# Patient Record
Sex: Male | Born: 1975 | Race: White | Hispanic: Yes | Marital: Married | Smoking: Former smoker
Health system: Southern US, Community
[De-identification: ages and names within clinical notes are randomized; demographics above are authoritative.]

## PROBLEM LIST (undated history)

## (undated) DIAGNOSIS — M869 Osteomyelitis, unspecified: Secondary | ICD-10-CM

## (undated) DIAGNOSIS — S2239XA Fracture of one rib, unspecified side, initial encounter for closed fracture: Secondary | ICD-10-CM

## (undated) DIAGNOSIS — S24109A Unspecified injury at unspecified level of thoracic spinal cord, initial encounter: Secondary | ICD-10-CM

## (undated) DIAGNOSIS — F32A Depression, unspecified: Secondary | ICD-10-CM

## (undated) DIAGNOSIS — I10 Essential (primary) hypertension: Secondary | ICD-10-CM

## (undated) DIAGNOSIS — E559 Vitamin D deficiency, unspecified: Secondary | ICD-10-CM

## (undated) DIAGNOSIS — F329 Major depressive disorder, single episode, unspecified: Secondary | ICD-10-CM

## (undated) DIAGNOSIS — N39498 Other specified urinary incontinence: Secondary | ICD-10-CM

## (undated) DIAGNOSIS — L89324 Pressure ulcer of left buttock, stage 4: Secondary | ICD-10-CM

## (undated) DIAGNOSIS — N3289 Other specified disorders of bladder: Secondary | ICD-10-CM

## (undated) DIAGNOSIS — M549 Dorsalgia, unspecified: Secondary | ICD-10-CM

## (undated) DIAGNOSIS — M62838 Other muscle spasm: Secondary | ICD-10-CM

## (undated) DIAGNOSIS — M726 Necrotizing fasciitis: Secondary | ICD-10-CM

## (undated) DIAGNOSIS — G8220 Paraplegia, unspecified: Secondary | ICD-10-CM

## (undated) HISTORY — DX: Vitamin D deficiency, unspecified: E55.9

## (undated) HISTORY — DX: Dorsalgia, unspecified: M54.9

## (undated) HISTORY — DX: Paraplegia, unspecified: G82.20

## (undated) HISTORY — DX: Other muscle spasm: M62.838

## (undated) HISTORY — DX: Osteomyelitis, unspecified: M86.9

## (undated) HISTORY — DX: Pressure ulcer of left buttock, stage 4: L89.324

## (undated) HISTORY — DX: Fracture of one rib, unspecified side, initial encounter for closed fracture: S22.39XA

## (undated) HISTORY — PX: POSTERIOR FIXATION SPINE: SUR1043

## (undated) HISTORY — DX: Major depressive disorder, single episode, unspecified: F32.9

## (undated) HISTORY — DX: Other specified disorders of bladder: N32.89

## (undated) HISTORY — DX: Essential (primary) hypertension: I10

## (undated) HISTORY — DX: Necrotizing fasciitis: M72.6

## (undated) HISTORY — DX: Other specified urinary incontinence: N39.498

## (undated) HISTORY — DX: Depression, unspecified: F32.A

---

## 2000-09-24 DIAGNOSIS — S2239XA Fracture of one rib, unspecified side, initial encounter for closed fracture: Secondary | ICD-10-CM

## 2000-09-24 DIAGNOSIS — S24109A Unspecified injury at unspecified level of thoracic spinal cord, initial encounter: Secondary | ICD-10-CM

## 2000-09-24 HISTORY — DX: Fracture of one rib, unspecified side, initial encounter for closed fracture: S22.39XA

## 2000-09-24 HISTORY — DX: Unspecified injury at unspecified level of thoracic spinal cord, initial encounter: S24.109A

## 2001-09-20 DIAGNOSIS — G8220 Paraplegia, unspecified: Secondary | ICD-10-CM

## 2001-09-20 HISTORY — DX: Paraplegia, unspecified: G82.20

## 2002-06-12 ENCOUNTER — Encounter: Payer: Self-pay | Admitting: Emergency Medicine

## 2002-06-12 ENCOUNTER — Inpatient Hospital Stay (HOSPITAL_COMMUNITY): Admission: EM | Admit: 2002-06-12 | Discharge: 2002-06-15 | Payer: Self-pay | Admitting: Emergency Medicine

## 2002-06-15 ENCOUNTER — Encounter (INDEPENDENT_AMBULATORY_CARE_PROVIDER_SITE_OTHER): Payer: Self-pay | Admitting: Cardiology

## 2002-06-25 ENCOUNTER — Encounter: Admission: RE | Admit: 2002-06-25 | Discharge: 2002-06-25 | Payer: Self-pay | Admitting: Internal Medicine

## 2002-07-28 ENCOUNTER — Encounter: Admission: RE | Admit: 2002-07-28 | Discharge: 2002-09-21 | Payer: Self-pay | Admitting: Infectious Diseases

## 2002-09-29 ENCOUNTER — Ambulatory Visit (HOSPITAL_COMMUNITY): Admission: RE | Admit: 2002-09-29 | Discharge: 2002-09-29 | Payer: Self-pay | Admitting: Internal Medicine

## 2002-09-29 ENCOUNTER — Encounter: Admission: RE | Admit: 2002-09-29 | Discharge: 2002-09-29 | Payer: Self-pay | Admitting: Internal Medicine

## 2002-12-09 ENCOUNTER — Encounter: Admission: RE | Admit: 2002-12-09 | Discharge: 2002-12-09 | Payer: Self-pay | Admitting: Internal Medicine

## 2003-02-25 ENCOUNTER — Encounter: Admission: RE | Admit: 2003-02-25 | Discharge: 2003-02-25 | Payer: Self-pay | Admitting: Internal Medicine

## 2003-03-01 ENCOUNTER — Encounter: Payer: Self-pay | Admitting: Internal Medicine

## 2003-03-01 ENCOUNTER — Ambulatory Visit (HOSPITAL_COMMUNITY): Admission: RE | Admit: 2003-03-01 | Discharge: 2003-03-01 | Payer: Self-pay | Admitting: Internal Medicine

## 2003-04-01 ENCOUNTER — Encounter: Admission: RE | Admit: 2003-04-01 | Discharge: 2003-04-01 | Payer: Self-pay | Admitting: Internal Medicine

## 2003-04-12 ENCOUNTER — Encounter: Payer: Self-pay | Admitting: Internal Medicine

## 2003-04-12 ENCOUNTER — Ambulatory Visit (HOSPITAL_COMMUNITY): Admission: RE | Admit: 2003-04-12 | Discharge: 2003-04-12 | Payer: Self-pay | Admitting: Internal Medicine

## 2003-04-28 ENCOUNTER — Encounter: Admission: RE | Admit: 2003-04-28 | Discharge: 2003-04-28 | Payer: Self-pay | Admitting: Infectious Diseases

## 2003-06-04 ENCOUNTER — Encounter: Payer: Self-pay | Admitting: Surgery

## 2003-06-04 ENCOUNTER — Encounter: Admission: RE | Admit: 2003-06-04 | Discharge: 2003-06-04 | Payer: Self-pay | Admitting: Surgery

## 2003-09-10 ENCOUNTER — Encounter: Admission: RE | Admit: 2003-09-10 | Discharge: 2003-09-10 | Payer: Self-pay | Admitting: Internal Medicine

## 2003-09-25 DIAGNOSIS — M62838 Other muscle spasm: Secondary | ICD-10-CM

## 2003-09-25 HISTORY — DX: Other muscle spasm: M62.838

## 2004-01-07 ENCOUNTER — Encounter: Admission: RE | Admit: 2004-01-07 | Discharge: 2004-01-07 | Payer: Self-pay | Admitting: Internal Medicine

## 2004-01-24 ENCOUNTER — Encounter: Admission: RE | Admit: 2004-01-24 | Discharge: 2004-04-23 | Payer: Self-pay | Admitting: Internal Medicine

## 2004-02-04 ENCOUNTER — Encounter: Admission: RE | Admit: 2004-02-04 | Discharge: 2004-02-04 | Payer: Self-pay | Admitting: Internal Medicine

## 2004-02-15 ENCOUNTER — Encounter: Admission: RE | Admit: 2004-02-15 | Discharge: 2004-02-15 | Payer: Self-pay | Admitting: Internal Medicine

## 2004-04-26 ENCOUNTER — Encounter: Admission: RE | Admit: 2004-04-26 | Discharge: 2004-04-26 | Payer: Self-pay | Admitting: Internal Medicine

## 2004-05-24 ENCOUNTER — Encounter: Admission: RE | Admit: 2004-05-24 | Discharge: 2004-05-24 | Payer: Self-pay | Admitting: Internal Medicine

## 2004-07-28 ENCOUNTER — Ambulatory Visit: Payer: Self-pay | Admitting: Internal Medicine

## 2004-09-24 DIAGNOSIS — N3289 Other specified disorders of bladder: Secondary | ICD-10-CM

## 2004-09-24 HISTORY — DX: Other specified disorders of bladder: N32.89

## 2004-10-12 ENCOUNTER — Ambulatory Visit: Payer: Self-pay | Admitting: Internal Medicine

## 2004-10-24 ENCOUNTER — Ambulatory Visit: Payer: Self-pay

## 2004-11-09 ENCOUNTER — Ambulatory Visit: Payer: Self-pay

## 2005-01-19 ENCOUNTER — Ambulatory Visit: Payer: Self-pay | Admitting: Internal Medicine

## 2005-02-02 ENCOUNTER — Ambulatory Visit: Payer: Self-pay | Admitting: Internal Medicine

## 2005-04-27 ENCOUNTER — Ambulatory Visit: Payer: Self-pay | Admitting: Internal Medicine

## 2005-05-03 ENCOUNTER — Ambulatory Visit (HOSPITAL_COMMUNITY): Admission: RE | Admit: 2005-05-03 | Discharge: 2005-05-03 | Payer: Self-pay | Admitting: Internal Medicine

## 2005-05-10 ENCOUNTER — Ambulatory Visit: Payer: Self-pay | Admitting: Internal Medicine

## 2005-05-21 ENCOUNTER — Ambulatory Visit (HOSPITAL_COMMUNITY): Admission: RE | Admit: 2005-05-21 | Discharge: 2005-05-21 | Payer: Self-pay | Admitting: Surgery

## 2005-11-01 ENCOUNTER — Ambulatory Visit: Payer: Self-pay | Admitting: Hospitalist

## 2005-12-10 ENCOUNTER — Ambulatory Visit: Payer: Self-pay | Admitting: Internal Medicine

## 2005-12-20 ENCOUNTER — Ambulatory Visit: Payer: Self-pay | Admitting: Internal Medicine

## 2006-01-09 ENCOUNTER — Ambulatory Visit: Payer: Self-pay | Admitting: Hospitalist

## 2006-02-28 ENCOUNTER — Ambulatory Visit: Payer: Self-pay | Admitting: Hospitalist

## 2006-08-08 ENCOUNTER — Ambulatory Visit: Payer: Self-pay | Admitting: Hospitalist

## 2006-08-08 ENCOUNTER — Ambulatory Visit (HOSPITAL_COMMUNITY): Admission: RE | Admit: 2006-08-08 | Discharge: 2006-08-08 | Payer: Self-pay | Admitting: Internal Medicine

## 2006-09-02 ENCOUNTER — Ambulatory Visit: Payer: Self-pay | Admitting: Physical Medicine & Rehabilitation

## 2006-09-02 ENCOUNTER — Encounter
Admission: RE | Admit: 2006-09-02 | Discharge: 2006-12-01 | Payer: Self-pay | Admitting: Physical Medicine & Rehabilitation

## 2006-09-27 ENCOUNTER — Ambulatory Visit: Payer: Self-pay | Admitting: Physical Medicine & Rehabilitation

## 2006-10-25 ENCOUNTER — Ambulatory Visit: Payer: Self-pay | Admitting: Physical Medicine & Rehabilitation

## 2006-12-06 ENCOUNTER — Encounter
Admission: RE | Admit: 2006-12-06 | Discharge: 2007-03-06 | Payer: Self-pay | Admitting: Physical Medicine & Rehabilitation

## 2006-12-06 ENCOUNTER — Ambulatory Visit: Payer: Self-pay | Admitting: Physical Medicine & Rehabilitation

## 2007-01-30 ENCOUNTER — Telehealth (INDEPENDENT_AMBULATORY_CARE_PROVIDER_SITE_OTHER): Payer: Self-pay | Admitting: *Deleted

## 2007-02-25 ENCOUNTER — Telehealth (INDEPENDENT_AMBULATORY_CARE_PROVIDER_SITE_OTHER): Payer: Self-pay | Admitting: *Deleted

## 2007-02-27 ENCOUNTER — Ambulatory Visit: Payer: Self-pay | Admitting: Physical Medicine & Rehabilitation

## 2007-03-12 ENCOUNTER — Telehealth (INDEPENDENT_AMBULATORY_CARE_PROVIDER_SITE_OTHER): Payer: Self-pay | Admitting: *Deleted

## 2007-04-01 ENCOUNTER — Ambulatory Visit: Payer: Self-pay | Admitting: Hospitalist

## 2007-04-01 DIAGNOSIS — E8881 Metabolic syndrome: Secondary | ICD-10-CM

## 2007-04-01 DIAGNOSIS — F32A Depression, unspecified: Secondary | ICD-10-CM | POA: Insufficient documentation

## 2007-04-01 DIAGNOSIS — I10 Essential (primary) hypertension: Secondary | ICD-10-CM | POA: Insufficient documentation

## 2007-04-01 DIAGNOSIS — J309 Allergic rhinitis, unspecified: Secondary | ICD-10-CM | POA: Insufficient documentation

## 2007-04-01 DIAGNOSIS — F329 Major depressive disorder, single episode, unspecified: Secondary | ICD-10-CM

## 2007-04-09 ENCOUNTER — Telehealth: Payer: Self-pay | Admitting: *Deleted

## 2007-05-29 ENCOUNTER — Telehealth: Payer: Self-pay | Admitting: *Deleted

## 2007-07-02 ENCOUNTER — Ambulatory Visit: Payer: Self-pay | Admitting: Physical Medicine & Rehabilitation

## 2007-07-02 ENCOUNTER — Encounter
Admission: RE | Admit: 2007-07-02 | Discharge: 2007-07-03 | Payer: Self-pay | Admitting: Physical Medicine & Rehabilitation

## 2007-07-29 ENCOUNTER — Telehealth: Payer: Self-pay | Admitting: *Deleted

## 2007-07-31 ENCOUNTER — Ambulatory Visit: Payer: Self-pay | Admitting: Hospitalist

## 2007-07-31 LAB — CONVERTED CEMR LAB
Albumin: 4.5 g/dL (ref 3.5–5.2)
Alkaline Phosphatase: 64 units/L (ref 39–117)
BUN: 17 mg/dL (ref 6–23)
CO2: 25 meq/L (ref 19–32)
Cholesterol: 205 mg/dL — ABNORMAL HIGH (ref 0–200)
Creatinine, Ser: 0.69 mg/dL (ref 0.40–1.50)
HDL: 26 mg/dL — ABNORMAL LOW (ref >39)
Leukocytes, UA: NEGATIVE
Nitrite: POSITIVE — AB
Protein, ur: NEGATIVE mg/dL
Specific Gravity, Urine: 1.018 (ref 1.005–1.03)
Total Bilirubin: 0.7 mg/dL (ref 0.3–1.2)
Total CHOL/HDL Ratio: 7.9
Triglycerides: 464 mg/dL — ABNORMAL HIGH (ref <150)
Urine Glucose: 100 mg/dL — AB
Urobilinogen, UA: 1 (ref 0.0–1.0)

## 2007-08-06 ENCOUNTER — Telehealth: Payer: Self-pay | Admitting: *Deleted

## 2007-09-26 ENCOUNTER — Encounter
Admission: RE | Admit: 2007-09-26 | Discharge: 2007-12-25 | Payer: Self-pay | Admitting: Physical Medicine & Rehabilitation

## 2007-10-16 ENCOUNTER — Ambulatory Visit: Payer: Self-pay | Admitting: Physical Medicine & Rehabilitation

## 2007-10-22 ENCOUNTER — Ambulatory Visit: Payer: Self-pay | Admitting: Hospitalist

## 2007-11-04 ENCOUNTER — Encounter (INDEPENDENT_AMBULATORY_CARE_PROVIDER_SITE_OTHER): Payer: Self-pay | Admitting: Hospitalist

## 2007-11-04 ENCOUNTER — Encounter: Admission: RE | Admit: 2007-11-04 | Discharge: 2008-02-02 | Payer: Self-pay | Admitting: Hospitalist

## 2007-12-25 ENCOUNTER — Encounter (INDEPENDENT_AMBULATORY_CARE_PROVIDER_SITE_OTHER): Payer: Self-pay | Admitting: Hospitalist

## 2008-01-12 ENCOUNTER — Encounter
Admission: RE | Admit: 2008-01-12 | Discharge: 2008-04-11 | Payer: Self-pay | Admitting: Physical Medicine & Rehabilitation

## 2008-01-14 ENCOUNTER — Ambulatory Visit: Payer: Self-pay | Admitting: Physical Medicine & Rehabilitation

## 2008-02-03 ENCOUNTER — Encounter: Admission: RE | Admit: 2008-02-03 | Discharge: 2008-05-03 | Payer: Self-pay | Admitting: Hospitalist

## 2008-02-21 ENCOUNTER — Encounter (INDEPENDENT_AMBULATORY_CARE_PROVIDER_SITE_OTHER): Payer: Self-pay | Admitting: Hospitalist

## 2008-05-07 ENCOUNTER — Encounter
Admission: RE | Admit: 2008-05-07 | Discharge: 2008-05-12 | Payer: Self-pay | Admitting: Physical Medicine & Rehabilitation

## 2008-05-12 ENCOUNTER — Ambulatory Visit: Payer: Self-pay | Admitting: Physical Medicine & Rehabilitation

## 2008-05-25 ENCOUNTER — Encounter (INDEPENDENT_AMBULATORY_CARE_PROVIDER_SITE_OTHER): Payer: Self-pay | Admitting: Internal Medicine

## 2008-05-25 ENCOUNTER — Ambulatory Visit: Payer: Self-pay | Admitting: Internal Medicine

## 2008-05-25 DIAGNOSIS — K5900 Constipation, unspecified: Secondary | ICD-10-CM | POA: Insufficient documentation

## 2008-06-09 ENCOUNTER — Telehealth (INDEPENDENT_AMBULATORY_CARE_PROVIDER_SITE_OTHER): Payer: Self-pay | Admitting: Internal Medicine

## 2008-06-22 ENCOUNTER — Encounter: Payer: Self-pay | Admitting: Internal Medicine

## 2008-06-22 ENCOUNTER — Ambulatory Visit: Payer: Self-pay | Admitting: Internal Medicine

## 2008-06-22 LAB — CONVERTED CEMR LAB
ALT: 44 units/L (ref 0–53)
Alkaline Phosphatase: 82 units/L (ref 39–117)
BUN: 22 mg/dL (ref 6–23)
CO2: 23 meq/L (ref 19–32)
Chloride: 95 meq/L — ABNORMAL LOW (ref 96–112)
Creatinine, Ser: 0.85 mg/dL (ref 0.40–1.50)
Helicobacter Pylori Antibody-IgG: 0.6
Potassium: 3.5 meq/L (ref 3.5–5.3)
Sodium: 138 meq/L (ref 135–145)
Total Protein: 8 g/dL (ref 6.0–8.3)

## 2008-06-24 ENCOUNTER — Encounter (INDEPENDENT_AMBULATORY_CARE_PROVIDER_SITE_OTHER): Payer: Self-pay | Admitting: Internal Medicine

## 2008-06-30 ENCOUNTER — Ambulatory Visit: Payer: Self-pay | Admitting: Infectious Disease

## 2008-06-30 ENCOUNTER — Encounter: Payer: Self-pay | Admitting: Licensed Clinical Social Worker

## 2008-06-30 DIAGNOSIS — E559 Vitamin D deficiency, unspecified: Secondary | ICD-10-CM

## 2008-06-30 HISTORY — DX: Vitamin D deficiency, unspecified: E55.9

## 2008-07-02 ENCOUNTER — Ambulatory Visit (HOSPITAL_COMMUNITY): Admission: RE | Admit: 2008-07-02 | Discharge: 2008-07-02 | Payer: Self-pay | Admitting: Infectious Disease

## 2008-07-14 ENCOUNTER — Ambulatory Visit: Payer: Self-pay | Admitting: Internal Medicine

## 2008-07-15 ENCOUNTER — Telehealth (INDEPENDENT_AMBULATORY_CARE_PROVIDER_SITE_OTHER): Payer: Self-pay | Admitting: Internal Medicine

## 2008-07-16 ENCOUNTER — Ambulatory Visit (HOSPITAL_COMMUNITY): Admission: RE | Admit: 2008-07-16 | Discharge: 2008-07-16 | Payer: Self-pay | Admitting: Internal Medicine

## 2008-08-02 ENCOUNTER — Telehealth: Payer: Self-pay | Admitting: *Deleted

## 2008-08-18 ENCOUNTER — Ambulatory Visit: Payer: Self-pay | Admitting: Infectious Diseases

## 2008-09-03 ENCOUNTER — Encounter
Admission: RE | Admit: 2008-09-03 | Discharge: 2008-09-06 | Payer: Self-pay | Admitting: Physical Medicine & Rehabilitation

## 2008-09-06 ENCOUNTER — Ambulatory Visit: Payer: Self-pay | Admitting: Physical Medicine & Rehabilitation

## 2008-11-10 ENCOUNTER — Telehealth (INDEPENDENT_AMBULATORY_CARE_PROVIDER_SITE_OTHER): Payer: Self-pay | Admitting: Internal Medicine

## 2008-11-11 ENCOUNTER — Telehealth: Payer: Self-pay | Admitting: *Deleted

## 2008-11-25 ENCOUNTER — Encounter (INDEPENDENT_AMBULATORY_CARE_PROVIDER_SITE_OTHER): Payer: Self-pay | Admitting: Internal Medicine

## 2008-12-15 ENCOUNTER — Telehealth (INDEPENDENT_AMBULATORY_CARE_PROVIDER_SITE_OTHER): Payer: Self-pay | Admitting: Internal Medicine

## 2008-12-30 ENCOUNTER — Encounter
Admission: RE | Admit: 2008-12-30 | Discharge: 2009-01-03 | Payer: Self-pay | Admitting: Physical Medicine & Rehabilitation

## 2009-01-03 ENCOUNTER — Ambulatory Visit: Payer: Self-pay | Admitting: Physical Medicine & Rehabilitation

## 2009-01-05 ENCOUNTER — Ambulatory Visit (HOSPITAL_COMMUNITY)
Admission: RE | Admit: 2009-01-05 | Discharge: 2009-01-05 | Payer: Self-pay | Admitting: Physical Medicine & Rehabilitation

## 2009-03-30 ENCOUNTER — Telehealth: Payer: Self-pay | Admitting: Infectious Diseases

## 2009-06-01 ENCOUNTER — Telehealth (INDEPENDENT_AMBULATORY_CARE_PROVIDER_SITE_OTHER): Payer: Self-pay | Admitting: *Deleted

## 2009-07-01 ENCOUNTER — Telehealth: Payer: Self-pay | Admitting: *Deleted

## 2009-08-01 ENCOUNTER — Ambulatory Visit: Payer: Self-pay | Admitting: Internal Medicine

## 2009-08-01 DIAGNOSIS — G4700 Insomnia, unspecified: Secondary | ICD-10-CM

## 2009-10-12 ENCOUNTER — Telehealth: Payer: Self-pay | Admitting: Internal Medicine

## 2009-12-29 ENCOUNTER — Telehealth (INDEPENDENT_AMBULATORY_CARE_PROVIDER_SITE_OTHER): Payer: Self-pay | Admitting: Internal Medicine

## 2010-01-23 ENCOUNTER — Telehealth (INDEPENDENT_AMBULATORY_CARE_PROVIDER_SITE_OTHER): Payer: Self-pay | Admitting: Internal Medicine

## 2010-03-21 ENCOUNTER — Telehealth: Payer: Self-pay | Admitting: Internal Medicine

## 2010-08-09 ENCOUNTER — Telehealth (INDEPENDENT_AMBULATORY_CARE_PROVIDER_SITE_OTHER): Payer: Self-pay | Admitting: *Deleted

## 2010-08-14 ENCOUNTER — Ambulatory Visit: Payer: Self-pay | Admitting: Internal Medicine

## 2010-08-14 ENCOUNTER — Encounter: Payer: Self-pay | Admitting: Internal Medicine

## 2010-08-14 LAB — CONVERTED CEMR LAB
BUN: 13 mg/dL (ref 6–23)
Calcium: 8.7 mg/dL (ref 8.4–10.5)
Chloride: 101 meq/L (ref 96–112)

## 2010-09-11 ENCOUNTER — Telehealth: Payer: Self-pay | Admitting: Internal Medicine

## 2010-10-16 ENCOUNTER — Ambulatory Visit: Admission: RE | Admit: 2010-10-16 | Discharge: 2010-10-16 | Payer: Self-pay | Source: Home / Self Care

## 2010-10-24 NOTE — Progress Notes (Signed)
Summary: Refill/gh  Phone Note Refill Request Message from:  Fax from Pharmacy on March 21, 2010 11:56 AM  Refills Requested: Medication #1:  ACCUPRIL 40 MG TABS Take 1 tablet by mouth once a day   Last Refilled: 12/13/2009 Last vist 08/01/2009   Method Requested: Electronic Initial call taken by: Angelina Ok RN,  March 21, 2010 11:56 AM  Follow-up for Phone Call        Refill approved-nurse to complete. Please make appointment for August with new PCP. Follow-up by: Julaine Fusi  DO,  March 21, 2010 12:27 PM    Prescriptions: ACCUPRIL 40 MG TABS (QUINAPRIL HCL) Take 1 tablet by mouth once a day  #31 x 5   Entered and Authorized by:   Julaine Fusi  DO   Signed by:   Julaine Fusi  DO on 03/21/2010   Method used:   Faxed to ...       [facility] Department (retail)       [address] [place], [place]  [address]       Ph: [phone]       Fax: [phone]   RxID:   [id number]

## 2010-10-24 NOTE — Progress Notes (Signed)
Summary: Refill/gh  Phone Note Refill Request Message from:  Fax from Pharmacy on October 12, 2009 4:52 PM  Refills Requested: Medication #1:  PROZAC 20 MG  CAPS one tablet  daily   Last Refilled: 01/10/2009 Last visit 11/8//2010   Method Requested: Fax to Local Pharmacy Initial call taken by: Angelina Ok RN,  October 12, 2009 4:52 PM  Follow-up for Phone Call        Denied.  Cameron Zavala was given a prescription of #31 with 11 refills just over 2 months ago by Dr. Tobie Lords.  Please call patient or pharmacy to figure out why there are no remaining refills.  There should be at least 8 more refills and these should be used before requesting additional refills.  Thank You. Follow-up by: Doneen Poisson MD,  October 12, 2009 5:51 PM  Additional Follow-up for Phone Call Additional follow up Details #1::        Call to [facility] pt has not gotten rwefills fron there recently.  Refills were going to the [facility] [address].  Pt got his last refill 10/08/2009.  Cancelled at [facility] Department. Additional Follow-up by: Angelina Ok RN,  October 17, 2009 3:30 PM

## 2010-10-24 NOTE — Progress Notes (Signed)
Summary: med refill/gp  Phone Note Refill Request Message from:  Fax from Pharmacy on Jan 23, 2010 3:25 PM  Refills Requested: Medication #1:  IMDUR 30 MG XR24H-TAB Take 1 tablet by mouth once a day   Last Refilled: 11/23/2009  Method Requested: Telephone to Pharmacy Initial call taken by: Chinita Pester RN,  Jan 23, 2010 3:25 PM  Follow-up for Phone Call       Follow-up by: Silvestre Gunner MD,  Jan 27, 2010 10:19 AM    Prescriptions: IMDUR 30 MG XR24H-TAB (ISOSORBIDE MONONITRATE) Take 1 tablet by mouth once a day  #30 x 11   Entered and Authorized by:   Silvestre Gunner MD   Signed by:   Silvestre Gunner MD on 01/27/2010   Method used:   Faxed to ...       [facility] Department (retail)       [address] [place], [place]  [address]       Ph: [phone]       Fax: [phone]   RxID:   [id number]

## 2010-10-24 NOTE — Progress Notes (Signed)
Summary: med refill/gp  Phone Note Refill Request Message from:  Fax from Pharmacy on August 09, 2010 9:39 AM  Refills Requested: Medication #1:  ACCUPRIL 40 MG TABS Take 1 tablet by mouth once a day   Last Refilled: 05/23/2010 Appt. has been  scheduled for Oct 16, 2010.   Method Requested: Fax to Local Pharmacy Initial call taken by: Chinita Pester RN,  August 09, 2010 9:39 AM  Follow-up for Phone Call        Must have Bmet in next few days. Follow-up by: Ulyess Mort MD,  August 09, 2010 11:42 AM  Additional Follow-up for Phone Call Additional follow up Details #1::        Rx called to pharmacy - [facility] pharmacy.  Chilon will  schedule pt. for lab. Additional Follow-up by: Chinita Pester RN,  August 09, 2010 2:09 PM    Prescriptions: ACCUPRIL 40 MG TABS (QUINAPRIL HCL) Take 1 tablet by mouth once a day  #31 x 0   Entered and Authorized by:   Ulyess Mort MD   Signed by:   Ulyess Mort MD on 08/09/2010   Method used:   Telephoned to ...       [facility] Department (retail)       [address] [place], [place]  [address]       Ph: [phone]       Fax: [phone]   RxID:   [id number]   Process Orders Check Orders Results:     [facility]: ABN not required for this insurance Order queued for requisitioning for [facility]: August 09, 2010 2:12 PM Tests Sent for requisitioning (August 10, 2010 3:57 PM):     08/11/2010: [facility] -- T-Basic Metabolic Panel [phone] (signed)

## 2010-10-24 NOTE — Assessment & Plan Note (Signed)
Summary: FLU SHOT/CFB  Nurse Visit   Allergies: 1)  ! * Silver, Chrome 2)  ! * Tape 3)  ! Sulfa  Immunizations Administered:  Influenza Vaccine # 1:    Vaccine Type: Fluvax Non-MCR    Site: right deltoid    Mfr: GlaxoSmithKline    Dose: 0.5 ml    Route: IM    Given by: Angelina Ok RN    Exp. Date: 03/24/2011    Lot #: [id number]    VIS given: 04/18/10 version given August 14, 2010.  Flu Vaccine Consent Questions:    Do you have a history of severe allergic reactions to this vaccine? no    Any prior history of allergic reactions to egg and/or gelatin? no    Do you have a sensitivity to the preservative Thimersol? no    Do you have a past history of Guillan-Barre Syndrome? no    Do you currently have an acute febrile illness? no    Have you ever had a severe reaction to latex? no    Vaccine information given and explained to patient? yes  Orders Added: 1)  Influenza Vaccine NON MCR [00028]

## 2010-10-24 NOTE — Progress Notes (Signed)
Summary: Refill/gh  Phone Note Refill Request Message from:  Fax from Pharmacy on December 29, 2009 11:14 AM  Refills Requested: Medication #1:  HYDROCODONE-ACETAMINOPHEN 5-325 MG TABS 2 tabs every 8 hours as needed for pain   Last Refilled: 12/28/2009  Method Requested: Fax to Local Pharmacy Initial call taken by: Angelina Ok RN,  December 29, 2009 11:15 AM  Follow-up for Phone Call        Refill approved-nurse to complete Follow-up by: Ulyess Mort MD,  December 30, 2009 4:05 PM  Additional Follow-up for Phone Call Additional follow up Details #1::        Rx faxed to pharmacy Additional Follow-up by: Angelina Ok RN,  December 30, 2009 4:46 PM    Prescriptions: HYDROCODONE-ACETAMINOPHEN 5-325 MG TABS (HYDROCODONE-ACETAMINOPHEN) 2 tabs every 8 hours as needed for pain  #180 x 3   Entered and Authorized by:   Ulyess Mort MD   Signed by:   Ulyess Mort MD on 12/30/2009   Method used:   Telephoned to ...       [facility] Department (retail)       [address] [place], [place]  [address]       Ph: [phone]       Fax: [phone]   RxID:   [id number]

## 2010-10-26 NOTE — Progress Notes (Signed)
Summary: refill/ hla  Phone Note Refill Request Message from:  Patient on September 11, 2010 2:38 PM  Refills Requested: Medication #1:  ACCUPRIL 40 MG TABS Take 1 tablet by mouth once a day   Last Refilled: 11/16 pt has appt 1/23  Initial call taken by: Marin Roberts RN,  September 11, 2010 2:38 PM    Prescriptions: ACCUPRIL 40 MG TABS (QUINAPRIL HCL) Take 1 tablet by mouth once a day  #31 x 0   Entered and Authorized by:   Almyra Deforest MD   Signed by:   Almyra Deforest MD on 09/12/2010   Method used:   Faxed to ...       [facility] (retail)       [address]       [place], [place]  [address]       Ph: [phone]       Fax: [phone]   RxID:   [id number] ACCUPRIL 40 MG TABS (QUINAPRIL HCL) Take 1 tablet by mouth once a day  #31 x 0   Entered and Authorized by:   Almyra Deforest MD   Signed by:   Almyra Deforest MD on 09/12/2010   Method used:   Print then Give to Patient   RxID:   [id number]   No prescription was given to patient. It was faxed to [facility]. Medication Assistance Program. Patient has not been seen since 07/2009. Patient has a follow up appointment on 10/16/2010. Please evaluate patient's medication regimen.

## 2010-10-31 ENCOUNTER — Other Ambulatory Visit: Payer: Self-pay | Admitting: *Deleted

## 2010-10-31 MED ORDER — QUINAPRIL HCL 40 MG PO TABS: 40.0000 mg | ORAL_TABLET | Freq: Every day | ORAL | Status: DC

## 2010-10-31 NOTE — Telephone Encounter (Signed)
Last refill 09/21/10 

## 2010-10-31 NOTE — Telephone Encounter (Signed)
Rx faxed in.
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
## 2010-11-30 ENCOUNTER — Other Ambulatory Visit: Payer: Self-pay | Admitting: *Deleted

## 2010-11-30 MED ORDER — FLUOXETINE HCL 20 MG PO CAPS: 20.0000 mg | ORAL_CAPSULE | Freq: Every day | ORAL | Status: DC

## 2010-12-01 NOTE — Telephone Encounter (Signed)
Refill faxed to [facility].

## 2010-12-07 ENCOUNTER — Other Ambulatory Visit: Payer: Self-pay | Admitting: *Deleted

## 2010-12-11 ENCOUNTER — Telehealth: Payer: Self-pay | Admitting: Internal Medicine

## 2010-12-11 MED ORDER — CYCLOBENZAPRINE HCL 10 MG PO TABS: ORAL_TABLET | ORAL | Status: DC
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
## 2011-02-15 ENCOUNTER — Encounter: Payer: Self-pay | Admitting: Internal Medicine

## 2011-02-27 ENCOUNTER — Other Ambulatory Visit: Payer: Self-pay | Admitting: *Deleted

## 2011-02-27 DIAGNOSIS — F329 Major depressive disorder, single episode, unspecified: Secondary | ICD-10-CM

## 2011-02-27 MED ORDER — FLUOXETINE HCL 20 MG PO CAPS: 20.0000 mg | ORAL_CAPSULE | Freq: Every day | ORAL | Status: DC

## 2011-02-28 ENCOUNTER — Other Ambulatory Visit: Payer: Self-pay | Admitting: *Deleted

## 2011-02-28 DIAGNOSIS — I10 Essential (primary) hypertension: Secondary | ICD-10-CM

## 2011-02-28 MED ORDER — ISOSORBIDE MONONITRATE ER 30 MG PO TB24: 30.0000 mg | ORAL_TABLET | Freq: Every day | ORAL | Status: DC

## 2011-02-28 NOTE — Telephone Encounter (Signed)
Called to pharm 

## 2011-03-06 ENCOUNTER — Encounter: Payer: Self-pay | Admitting: Internal Medicine

## 2011-03-06 ENCOUNTER — Ambulatory Visit (INDEPENDENT_AMBULATORY_CARE_PROVIDER_SITE_OTHER): Payer: Self-pay | Admitting: Internal Medicine

## 2011-03-06 DIAGNOSIS — E785 Hyperlipidemia, unspecified: Secondary | ICD-10-CM

## 2011-03-06 DIAGNOSIS — F32A Depression, unspecified: Secondary | ICD-10-CM

## 2011-03-06 DIAGNOSIS — G589 Mononeuropathy, unspecified: Secondary | ICD-10-CM

## 2011-03-06 DIAGNOSIS — F3289 Other specified depressive episodes: Secondary | ICD-10-CM

## 2011-03-06 DIAGNOSIS — N39498 Other specified urinary incontinence: Secondary | ICD-10-CM

## 2011-03-06 DIAGNOSIS — K5909 Other constipation: Secondary | ICD-10-CM

## 2011-03-06 DIAGNOSIS — I10 Essential (primary) hypertension: Secondary | ICD-10-CM

## 2011-03-06 DIAGNOSIS — G8929 Other chronic pain: Secondary | ICD-10-CM | POA: Insufficient documentation

## 2011-03-06 DIAGNOSIS — F329 Major depressive disorder, single episode, unspecified: Secondary | ICD-10-CM

## 2011-03-06 DIAGNOSIS — G8220 Paraplegia, unspecified: Secondary | ICD-10-CM

## 2011-03-06 DIAGNOSIS — G629 Polyneuropathy, unspecified: Secondary | ICD-10-CM

## 2011-03-06 DIAGNOSIS — J309 Allergic rhinitis, unspecified: Secondary | ICD-10-CM

## 2011-03-06 DIAGNOSIS — G4700 Insomnia, unspecified: Secondary | ICD-10-CM

## 2011-03-06 LAB — COMPREHENSIVE METABOLIC PANEL
ALT: 22 U/L (ref 0–53)
Albumin: 5 g/dL (ref 3.5–5.2)
BUN: 12 mg/dL (ref 6–23)
CO2: 29 mEq/L (ref 19–32)
Potassium: 3.8 mEq/L (ref 3.5–5.3)
Total Protein: 7.6 g/dL (ref 6.0–8.3)

## 2011-03-06 LAB — LIPID PANEL
HDL: 34 mg/dL — ABNORMAL LOW (ref >39)
LDL Cholesterol: 148 mg/dL — ABNORMAL HIGH (ref 0–99)
Total CHOL/HDL Ratio: 6.5 Ratio
Triglycerides: 199 mg/dL — ABNORMAL HIGH (ref <150)

## 2011-03-06 LAB — CBC
HCT: 50.6 % (ref 39.0–52.0)
Hemoglobin: 17.5 g/dL — ABNORMAL HIGH (ref 13.0–17.0)
MCHC: 34.6 g/dL (ref 30.0–36.0)
MCV: 90.4 fL (ref 78.0–100.0)
RBC: 5.6 MIL/uL (ref 4.22–5.81)
RDW: 13.2 % (ref 11.5–15.5)
WBC: 6.2 10*3/uL (ref 4.0–10.5)

## 2011-03-06 MED ORDER — AMITRIPTYLINE HCL 50 MG PO TABS: 50.0000 mg | ORAL_TABLET | Freq: Every day | ORAL | Status: DC

## 2011-03-06 MED ORDER — CYCLOBENZAPRINE HCL 10 MG PO TABS: ORAL_TABLET | ORAL | Status: DC

## 2011-03-06 MED ORDER — SENNOSIDES 8.6 MG PO TABS: ORAL_TABLET | ORAL | Status: DC

## 2011-03-06 MED ORDER — HYDROCODONE-ACETAMINOPHEN 5-325 MG PO TABS: 1.0000 | ORAL_TABLET | Freq: Three times a day (TID) | ORAL | Status: DC | PRN

## 2011-03-06 MED ORDER — DOCUSATE SODIUM 100 MG PO CAPS: 100.0000 mg | ORAL_CAPSULE | Freq: Two times a day (BID) | ORAL | Status: DC

## 2011-03-06 MED ORDER — METOPROLOL SUCCINATE ER 50 MG PO TB24: 50.0000 mg | ORAL_TABLET | Freq: Every day | ORAL | Status: DC

## 2011-03-06 MED ORDER — ISOSORBIDE MONONITRATE ER 30 MG PO TB24: 30.0000 mg | ORAL_TABLET | Freq: Every day | ORAL | Status: DC

## 2011-03-06 MED ORDER — FLUOXETINE HCL 40 MG PO CAPS: 20.0000 mg | ORAL_CAPSULE | Freq: Every day | ORAL | Status: DC

## 2011-03-06 NOTE — Assessment & Plan Note (Signed)
Patient noted that the pain medication is not improving significantly the pain which is mostly at the T10 level. This has been going on for years. Patient was started on amitriptyline 2010. Patient has used Neurontin in the highest with no significant improvement. I will increase his amitriptyline to 50 once a day. Furthermore I prescribed pain medication today. The pain may be never controlled. I would consider to retry Neurontin but the main issue would be financial.

## 2011-03-06 NOTE — Assessment & Plan Note (Addendum)
Patient is not currently on any medication. I would check lipid panel today.  I will start patient on a statin. LDL, Cholesterol and Triglyceride elevated.

## 2011-03-06 NOTE — Assessment & Plan Note (Signed)
Patient's mood is well controlled. Patient denies any suicidal ideation.

## 2011-03-06 NOTE — Assessment & Plan Note (Signed)
Patient has chronic urinary incontinence likely neurogenic. Patient has been using in and out catheter in the past but discontinued due to recurrent urin tract infection. In 2002 and a renal ultrasound was performed which did not show any acute process. The recommendation at that time was to referred the patient to a new urologist which the patient did not do. At this point I do not want to start the patient on a medication like baclofen since  these there is no evidence of an acute efficacy reported and the relative benefit is unclear. Therefore I will refer the patient first to in urologist for further evaluation and possible management.
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
## 2011-03-06 NOTE — Assessment & Plan Note (Signed)
Patient reports 2-3 bowel movements per week and occasionally he does have hard stools. Patient is currently taking Colace twice a day and senna twice a day. I recommended if patient does not have every at the date of bowel movement he can increase senna 2 every 6 hours. Furthermore advised conservative measurements as in increasing fiber in diet. Patient is taking pain medication and amitriptyline which may contribute to constipation.

## 2011-03-06 NOTE — Assessment & Plan Note (Signed)
Patient is complaining about allergic rhinitis. Patient has used Claritin, that Benadryl and Zyrtec which is not helping. The best would be  nasal steroids but due to financial problems he would like to hold off on that. Reevaluate at the next office visit.

## 2011-03-06 NOTE — Assessment & Plan Note (Signed)
Patient noted that he has significant insomnia. Due to financial situation it will be difficult for the patient to get started on any kind of sleeping pill like Ambien. Therefore I will increase the dose of Prozac to 40 mg daily. Furthermore discussed with patient and wife about sleep hygiene.

## 2011-03-06 NOTE — Assessment & Plan Note (Addendum)
Patient has long history of uncontrolled blood pressures in the past. Am wondering if this was related to 2 medical noncompliance on the basis of financial problems. Patient noted he has been not taking his metoprolol. His blood pressure today is 132/92 on 3 medications. Patient's wife noted that she has to pay for hydrochlorothiazide but is able to receive Accupril, metoprolol and Imdur or for free with a map program. Therefore I discontinued hydrochlorothiazide this point. I'll recheck his blood pressures at the next office visit. If patient continues to have elevated blood pressure consider to restart hydrochlorothiazide at that point.

## 2011-03-07 MED ORDER — PRAVASTATIN SODIUM 20 MG PO TABS: 20.0000 mg | ORAL_TABLET | Freq: Every day | ORAL | Status: DC

## 2011-03-07 NOTE — Progress Notes (Signed)
Addended by: Almyra Deforest on: 03/07/2011 08:43 AM   Modules accepted: Orders

## 2011-03-13 NOTE — Telephone Encounter (Signed)
Reviewed

## 2011-04-07 ENCOUNTER — Encounter: Payer: Self-pay | Admitting: Internal Medicine

## 2011-04-09 ENCOUNTER — Ambulatory Visit (INDEPENDENT_AMBULATORY_CARE_PROVIDER_SITE_OTHER): Payer: Self-pay | Admitting: Internal Medicine

## 2011-04-09 ENCOUNTER — Encounter: Payer: Self-pay | Admitting: Internal Medicine

## 2011-04-09 DIAGNOSIS — E785 Hyperlipidemia, unspecified: Secondary | ICD-10-CM

## 2011-04-09 DIAGNOSIS — G8220 Paraplegia, unspecified: Secondary | ICD-10-CM

## 2011-04-09 DIAGNOSIS — G629 Polyneuropathy, unspecified: Secondary | ICD-10-CM

## 2011-04-09 DIAGNOSIS — G589 Mononeuropathy, unspecified: Secondary | ICD-10-CM

## 2011-04-09 DIAGNOSIS — Z23 Encounter for immunization: Secondary | ICD-10-CM

## 2011-04-09 DIAGNOSIS — J309 Allergic rhinitis, unspecified: Secondary | ICD-10-CM

## 2011-04-09 DIAGNOSIS — Z299 Encounter for prophylactic measures, unspecified: Secondary | ICD-10-CM

## 2011-04-09 DIAGNOSIS — N39498 Other specified urinary incontinence: Secondary | ICD-10-CM | POA: Insufficient documentation

## 2011-04-09 DIAGNOSIS — M810 Age-related osteoporosis without current pathological fracture: Secondary | ICD-10-CM

## 2011-04-09 DIAGNOSIS — G4700 Insomnia, unspecified: Secondary | ICD-10-CM

## 2011-04-09 DIAGNOSIS — K5909 Other constipation: Secondary | ICD-10-CM

## 2011-04-09 DIAGNOSIS — I10 Essential (primary) hypertension: Secondary | ICD-10-CM

## 2011-04-09 HISTORY — DX: Other specified urinary incontinence: N39.498

## 2011-04-09 MED ORDER — PRAVASTATIN SODIUM 20 MG PO TABS: 20.0000 mg | ORAL_TABLET | Freq: Every day | ORAL | Status: DC

## 2011-04-09 NOTE — Assessment & Plan Note (Signed)
LDL, Cholesterol and Triglyceride elevated. Patient informed that he needs to take Pravastatin which he was asked to take at the last office visit.

## 2011-04-09 NOTE — Assessment & Plan Note (Signed)
Patient noted no improvement of burning sensation with narco. Therefore I will d/c it today. Will continue Amitriptyline. Patient has been evaluated by pain clinic in the past with no success. Patient had taken in the past Neurontin with max dosage with no success and he noted it is to expensive to buy. This is a chronic issue and seems to be really difficult to approach. It has been evaluated in the past at [facility] and pain clinic with no success.

## 2011-04-09 NOTE — Assessment & Plan Note (Signed)
Patient has chronic urinary incontinence likely neurogenic. Patient has been using in and out catheter in the past but discontinued due to recurrent urin tract infection. In 2002 a renal ultrasound was performed which did not show any acute process. The recommendation at that time was refer the patient to a  urologist which the patient did not do.  Therefore I will refer the patient  to urologist for further evaluation and possible management.

## 2011-04-09 NOTE — Assessment & Plan Note (Signed)
Improved after dosage increase of Prozac. Again advised about sleep hygiene.

## 2011-04-09 NOTE — Assessment & Plan Note (Signed)
No further documentation. Will obtain Bone density scan for further evaluation.

## 2011-04-09 NOTE — Assessment & Plan Note (Signed)
BP controlled with HR in the 90s . Patient currently on Accupril and Imdur. Has not been taking Toprol. Advised to take it along with the other medication. Will continue on current regimen.

## 2011-04-09 NOTE — Assessment & Plan Note (Signed)
Patient noted persistent rhinitis but only whenever he goes out. Due to financial issue not able to afford any medication. Advised conservative measurements like washing hair, cleaning nose after going out. Not placing any clothing in the bedroom. Advised if significant that he needs steroid nasal spray.
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
## 2011-04-09 NOTE — Assessment & Plan Note (Signed)
Patient did not increase Senna if significant constipation. Again advised that he may increase senna to every 6 hours if needed and continue Colace and increased Fiber diet.

## 2011-04-09 NOTE — Assessment & Plan Note (Signed)
Patient noted he is very independent but the contraction in the lower extremities is worsening. Flexeril is not working. He has used baclofen in the past but had an allergy. The wife noted that PT/OT has not helped him in the past since only basic things were performed like moving from chair to wheelchair but no further assessment of lower extremities per wife. He may benefit from Neuro-rehab. We need to evaluate if he would qualify and he would be accepted since he has no insurance.

## 2011-04-18 ENCOUNTER — Ambulatory Visit (HOSPITAL_COMMUNITY)
Admission: RE | Admit: 2011-04-18 | Discharge: 2011-04-18 | Disposition: A | Discharge status: Home / Self Care | Payer: Self-pay | Source: Ambulatory Visit | Attending: Internal Medicine | Admitting: Internal Medicine

## 2011-04-18 DIAGNOSIS — M810 Age-related osteoporosis without current pathological fracture: Secondary | ICD-10-CM

## 2011-04-18 DIAGNOSIS — Z13820 Encounter for screening for osteoporosis: Secondary | ICD-10-CM | POA: Insufficient documentation

## 2011-04-30 ENCOUNTER — Encounter: Payer: Self-pay | Admitting: Internal Medicine

## 2011-04-30 DIAGNOSIS — M810 Age-related osteoporosis without current pathological fracture: Secondary | ICD-10-CM

## 2011-05-15 ENCOUNTER — Encounter: Payer: Self-pay | Admitting: Internal Medicine

## 2011-07-12 ENCOUNTER — Ambulatory Visit (INDEPENDENT_AMBULATORY_CARE_PROVIDER_SITE_OTHER): Payer: Self-pay | Admitting: Internal Medicine

## 2011-07-12 DIAGNOSIS — M549 Dorsalgia, unspecified: Secondary | ICD-10-CM

## 2011-07-12 DIAGNOSIS — I10 Essential (primary) hypertension: Secondary | ICD-10-CM

## 2011-07-12 DIAGNOSIS — N39498 Other specified urinary incontinence: Secondary | ICD-10-CM

## 2011-07-12 DIAGNOSIS — Z23 Encounter for immunization: Secondary | ICD-10-CM

## 2011-07-12 DIAGNOSIS — F329 Major depressive disorder, single episode, unspecified: Secondary | ICD-10-CM

## 2011-07-12 DIAGNOSIS — E785 Hyperlipidemia, unspecified: Secondary | ICD-10-CM

## 2011-07-12 DIAGNOSIS — M810 Age-related osteoporosis without current pathological fracture: Secondary | ICD-10-CM

## 2011-07-12 DIAGNOSIS — F3289 Other specified depressive episodes: Secondary | ICD-10-CM

## 2011-07-12 DIAGNOSIS — G8220 Paraplegia, unspecified: Secondary | ICD-10-CM

## 2011-07-12 MED ORDER — IBUPROFEN 200 MG PO CAPS: 600.0000 mg | ORAL_CAPSULE | Freq: Four times a day (QID) | ORAL | Status: DC | PRN

## 2011-07-12 MED ORDER — CALCIUM GLUCONATE 500 MG PO TABS: 1000.0000 mg | ORAL_TABLET | Freq: Every day | ORAL | Status: DC

## 2011-07-12 MED ORDER — VITAMIN D 400 UNITS PO TABS: 800.0000 [IU] | ORAL_TABLET | Freq: Every day | ORAL | Status: AC
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
## 2011-07-16 ENCOUNTER — Encounter: Payer: Self-pay | Admitting: Internal Medicine

## 2011-07-16 NOTE — Assessment & Plan Note (Signed)
Will recommend calcium and Vitamin D.

## 2011-07-16 NOTE — Assessment & Plan Note (Signed)
Most likely MSK .Will start the patient on ibuprofen and recommended warm compression. Patient can not do any exercise at this point to paraplegia to strength the back pain. Patient May benefit of PT but the patient has not  Renewed orange card . Furthermore there is a concern of compression pressure with history of Osteoporosis . Will obtain Xray of thoracic and lumbar spine. If the patient's symptoms does not improve consider changes in management.

## 2011-07-16 NOTE — Assessment & Plan Note (Addendum)
Patient has not been taking pravastatin as recommended . Patient noted today that statin causes nausea.  Since patient has only per Framingham CV risk of 5.4 % 10 year risk. Will continue to monitor at this point.

## 2011-07-16 NOTE — Assessment & Plan Note (Signed)
As soon as patient will renew orange card will refer the patient to Urology for further evaluation.

## 2011-07-16 NOTE — Assessment & Plan Note (Signed)
Patient 's is only taking Imdur and accupril and not metoprolol. His blood pressure is well controlled. Will continue current regimen.

## 2011-07-18 ENCOUNTER — Telehealth: Payer: Self-pay | Admitting: Licensed Clinical Social Worker

## 2011-07-20 NOTE — Telephone Encounter (Signed)
Foodstamps is $126 per month.
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
## 2011-07-23 NOTE — Telephone Encounter (Signed)
All resource info mailed today:  Programme researcher, broadcasting/film/video, [employer], [facility] for immigration assist, [employer], and [employer] crisis line.

## 2011-08-06 ENCOUNTER — Ambulatory Visit (INDEPENDENT_AMBULATORY_CARE_PROVIDER_SITE_OTHER): Payer: Self-pay | Admitting: Internal Medicine

## 2011-08-06 ENCOUNTER — Ambulatory Visit (HOSPITAL_COMMUNITY)
Admission: RE | Admit: 2011-08-06 | Discharge: 2011-08-06 | Disposition: A | Discharge status: Home / Self Care | Payer: Self-pay | Source: Ambulatory Visit | Attending: Internal Medicine | Admitting: Internal Medicine

## 2011-08-06 ENCOUNTER — Encounter: Payer: Self-pay | Admitting: Internal Medicine

## 2011-08-06 DIAGNOSIS — L89324 Pressure ulcer of left buttock, stage 4: Secondary | ICD-10-CM | POA: Insufficient documentation

## 2011-08-06 DIAGNOSIS — M549 Dorsalgia, unspecified: Secondary | ICD-10-CM | POA: Insufficient documentation

## 2011-08-06 DIAGNOSIS — F329 Major depressive disorder, single episode, unspecified: Secondary | ICD-10-CM

## 2011-08-06 DIAGNOSIS — I10 Essential (primary) hypertension: Secondary | ICD-10-CM

## 2011-08-06 DIAGNOSIS — R109 Unspecified abdominal pain: Secondary | ICD-10-CM | POA: Insufficient documentation

## 2011-08-06 LAB — BASIC METABOLIC PANEL
BUN: 13 mg/dL (ref 6–23)
CO2: 28 mEq/L (ref 19–32)
Chloride: 102 mEq/L (ref 96–112)
Potassium: 3.9 mEq/L (ref 3.5–5.3)
Sodium: 138 mEq/L (ref 135–145)

## 2011-08-06 MED ORDER — QUINAPRIL HCL 40 MG PO TABS: 40.0000 mg | ORAL_TABLET | Freq: Every day | ORAL | Status: DC

## 2011-08-06 MED ORDER — FLUOXETINE HCL 40 MG PO CAPS: 40.0000 mg | ORAL_CAPSULE | Freq: Every day | ORAL | Status: DC

## 2011-08-06 MED ORDER — ISOSORBIDE MONONITRATE ER 30 MG PO TB24: 30.0000 mg | ORAL_TABLET | Freq: Every day | ORAL | Status: DC

## 2011-08-06 NOTE — Assessment & Plan Note (Signed)
Again most likely MSK but not improving on ibuprofen. Other DD include compression fracture in the setting of Osteoporosis. Patient did not get spinal Xray. Since this is still concerning I would like to get a spinal xray today. Other DD include UTI, pyelonephritis and nephrolithiasis but unlikely since the pain has been now present for more then 4 weeks. Since my suspicious is low I am not getting a UA since patient would need an In and out cath.

## 2011-08-06 NOTE — Progress Notes (Signed)
  Subjective:   Patient ID: Cameron Zavala male   DOB: [birth year] 35 y.o.   MRN: [id number]  HPI: [name] is a 35 y.o. male with PMH significant as outlined below who presented to the clinic for a follow up of back pain. Patient noted that he continues to have back pain and now it has moved down and is more localized to the right . He denies any chest pain, SOB, changes in bowel habits and or urinary habits. Patient uses adult diaper.     Past Medical History  Diagnosis Date  . Hypertension   . Depression   . Hyperlipidemia   . GERD (gastroesophageal reflux disease)   . Allergy   . Abdominal pain     chronic in nature, CT abdomen 02/2003-02/2005 unremarkable except for bladder wall thickening with 7 mm ? tumor (s/p fiberoptic cystoscopy negative);   Marland Kitchen Decubitus ulcers     recurrent, stage 2A, followed by Pilar Grammes, R/L hip area   Current Outpatient Prescriptions  Medication Sig Dispense Refill  . amitriptyline (ELAVIL) 50 MG tablet Take 1 tablet (50 mg total) by mouth at bedtime.  30 tablet  3  . calcium gluconate 500 MG tablet Take 2 tablets (1,000 mg total) by mouth daily.  60 tablet  3  . docusate sodium (COLACE) 100 MG capsule Take 1 capsule (100 mg total) by mouth 2 (two) times daily.  60 capsule  3  . FLUoxetine (PROZAC) 40 MG capsule Take 1 capsule (40 mg total) by mouth daily.  90 capsule  3  . Ibuprofen 200 MG CAPS Take 3 capsules (600 mg total) by mouth every 6 (six) hours as needed.  120 each  0  . isosorbide mononitrate (IMDUR) 30 MG 24 hr tablet Take 1 tablet (30 mg total) by mouth daily.  90 tablet  3  . quinapril (ACCUPRIL) 40 MG tablet Take 1 tablet (40 mg total) by mouth at bedtime.  30 tablet  11  . senna (SENOKOT) 8.6 MG tablet If you are not having any bowel movement every other day take  Sennoside every 6 hours.  60 tablet  3  . vitamin D, CHOLECALCIFEROL, 400 UNITS tablet Take 2 tablets (800 Units total) by mouth daily.  60 tablet  0   Review  of Systems: Constitutional: Denies fever, chills, diaphoresis, appetite change and fatigue.  Respiratory: Denies SOB, DOE, cough, chest tightness,  and wheezing.   Cardiovascular: Denies chest pain, palpitations and leg swelling.  Gastrointestinal: Denies nausea, vomiting, abdominal pain, diarrhea, blood in stool and abdominal distention.  Genitourinary: Denies dysuria, urgency, frequency, hematuria, and difficulty urinating.    Objective:  Physical Exam: Filed Vitals:   08/06/11 1325  BP: 141/84  Pulse: 91  Temp: 98.2 F (36.8 C)  TempSrc: Oral   Constitutional: Vital signs reviewed.  Patient is a well-developed and well-nourished  in no acute distress and cooperative with exam. Alert and oriented x3.  Neck: Supple,   Cardiovascular: RRR, S1 normal, S2 normal, no MRG, pulses symmetric and intact bilaterally Pulmonary/Chest: CTAB, no wheezes, rales, or rhonchi Abdominal: Soft. Non-tender, non-distended, bowel sounds are normal, no masses, organomegaly, or guarding present.  GU: no CVA tenderness Spine: paraspinal tenderness on palpation more on the right then the left.  Neurological: A&O x3, no changes in neurological findings.

## 2011-08-17 ENCOUNTER — Encounter: Payer: Self-pay | Admitting: Internal Medicine

## 2011-08-20 ENCOUNTER — Telehealth: Payer: Self-pay | Admitting: Licensed Clinical Social Worker

## 2011-08-20 NOTE — Telephone Encounter (Signed)
Looking for direction on how to file an SSI claim.  Patient has VISA but not [place] citizenship.   Advised wife to contact Social Security 1-800 number and inquire as to whether his status would make him eligible.  Explained that Soc. Security will request [facility] records and also to give them names and dates of hospitalization.

## 2011-09-26 ENCOUNTER — Other Ambulatory Visit: Payer: Self-pay | Admitting: *Deleted

## 2011-09-26 DIAGNOSIS — G629 Polyneuropathy, unspecified: Secondary | ICD-10-CM

## 2011-09-26 MED ORDER — AMITRIPTYLINE HCL 50 MG PO TABS: 50.0000 mg | ORAL_TABLET | Freq: Every day | ORAL | Status: DC

## 2011-09-26 NOTE — Telephone Encounter (Signed)
Refill from Elavil 50 mg was called to [facility] with 3 additional refills.Angelina Ok, RN 09/26/2011 2:52 PM

## 2011-11-05 ENCOUNTER — Ambulatory Visit (INDEPENDENT_AMBULATORY_CARE_PROVIDER_SITE_OTHER): Payer: Self-pay | Admitting: Internal Medicine

## 2011-11-05 ENCOUNTER — Encounter: Payer: Self-pay | Admitting: Internal Medicine

## 2011-11-05 VITALS — BP 148/95 | HR 97 | Temp 98.0°F

## 2011-11-05 DIAGNOSIS — I10 Essential (primary) hypertension: Secondary | ICD-10-CM

## 2011-11-05 DIAGNOSIS — G629 Polyneuropathy, unspecified: Secondary | ICD-10-CM

## 2011-11-05 DIAGNOSIS — G8220 Paraplegia, unspecified: Secondary | ICD-10-CM | POA: Insufficient documentation

## 2011-11-05 DIAGNOSIS — H538 Other visual disturbances: Secondary | ICD-10-CM

## 2011-11-05 DIAGNOSIS — M62838 Other muscle spasm: Secondary | ICD-10-CM

## 2011-11-05 DIAGNOSIS — N39498 Other specified urinary incontinence: Secondary | ICD-10-CM

## 2011-11-05 LAB — POCT URINALYSIS DIPSTICK
Glucose, UA: NEGATIVE
Nitrite, UA: NEGATIVE
Urobilinogen, UA: 2
pH, UA: 6.5

## 2011-11-05 LAB — BASIC METABOLIC PANEL
CO2: 23 mEq/L (ref 19–32)
Chloride: 101 mEq/L (ref 96–112)
Glucose, Bld: 101 mg/dL — ABNORMAL HIGH (ref 70–99)
Sodium: 138 mEq/L (ref 135–145)

## 2011-11-05 LAB — CK: Total CK: 109 U/L (ref 7–232)

## 2011-11-05 MED ORDER — AMITRIPTYLINE HCL 50 MG PO TABS: 50.0000 mg | ORAL_TABLET | Freq: Every day | ORAL | Status: DC

## 2011-11-05 MED ORDER — CYCLOBENZAPRINE HCL 10 MG PO TABS: 10.0000 mg | ORAL_TABLET | Freq: Three times a day (TID) | ORAL | Status: DC | PRN

## 2011-11-05 NOTE — Assessment & Plan Note (Signed)
Likely due to myopia needing new glasses. I will refer patient to ophthalmology for further evaluation and management.

## 2011-11-05 NOTE — Patient Instructions (Signed)
You can buy Vitamin D 400 -or 800 IU
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
## 2011-11-05 NOTE — Assessment & Plan Note (Signed)
Unfortunately never evaluated by urology. Possibly patient delayed renewal of orange car  We will resend referral today again. 10/2011.

## 2011-11-05 NOTE — Assessment & Plan Note (Addendum)
I will refer patient to Neuro-rehabilitation to assist with improvement with daily activities.

## 2011-11-05 NOTE — Assessment & Plan Note (Signed)
Worsening muscle spasm. Will obtain lab work including basic metabolic panel, magnesium, CK and thyroid function. Will prescribe patient with Flexeril and recommended to take it 3 times a day. Will reevaluate patient to the next office visit. We'll followup on lab work for possible changes in management.

## 2011-11-06 LAB — URINALYSIS, ROUTINE W REFLEX MICROSCOPIC
Bilirubin Urine: NEGATIVE
Glucose, UA: NEGATIVE mg/dL
Leukocytes, UA: NEGATIVE
pH: 6.5 (ref 5.0–8.0)

## 2011-11-06 LAB — URINALYSIS, MICROSCOPIC ONLY

## 2011-11-07 LAB — URINE CULTURE: Colony Count: 80000

## 2011-11-12 ENCOUNTER — Ambulatory Visit: Payer: Self-pay | Attending: Internal Medicine | Admitting: Physical Therapy

## 2011-11-12 DIAGNOSIS — IMO0001 Reserved for inherently not codable concepts without codable children: Secondary | ICD-10-CM | POA: Insufficient documentation

## 2011-11-12 DIAGNOSIS — M2420 Disorder of ligament, unspecified site: Secondary | ICD-10-CM | POA: Insufficient documentation

## 2011-11-12 DIAGNOSIS — M629 Disorder of muscle, unspecified: Secondary | ICD-10-CM | POA: Insufficient documentation

## 2011-11-12 DIAGNOSIS — M6281 Muscle weakness (generalized): Secondary | ICD-10-CM | POA: Insufficient documentation

## 2011-11-26 ENCOUNTER — Ambulatory Visit: Payer: Self-pay | Admitting: Physical Therapy

## 2011-12-05 ENCOUNTER — Ambulatory Visit (INDEPENDENT_AMBULATORY_CARE_PROVIDER_SITE_OTHER): Payer: Self-pay | Admitting: Internal Medicine

## 2011-12-05 ENCOUNTER — Encounter: Payer: Self-pay | Admitting: Internal Medicine

## 2011-12-05 VITALS — BP 160/97 | HR 99 | Temp 98.0°F

## 2011-12-05 DIAGNOSIS — I10 Essential (primary) hypertension: Secondary | ICD-10-CM

## 2011-12-05 DIAGNOSIS — M549 Dorsalgia, unspecified: Secondary | ICD-10-CM

## 2011-12-05 DIAGNOSIS — N39498 Other specified urinary incontinence: Secondary | ICD-10-CM

## 2011-12-05 DIAGNOSIS — G8220 Paraplegia, unspecified: Secondary | ICD-10-CM

## 2011-12-05 DIAGNOSIS — M62838 Other muscle spasm: Secondary | ICD-10-CM

## 2011-12-05 MED ORDER — HYDROCORTISONE 2.5 % EX CREA: TOPICAL_CREAM | Freq: Two times a day (BID) | CUTANEOUS | Status: DC

## 2011-12-05 MED ORDER — CYCLOBENZAPRINE HCL 10 MG PO TABS: 10.0000 mg | ORAL_TABLET | Freq: Three times a day (TID) | ORAL | Status: DC | PRN

## 2011-12-05 NOTE — Assessment & Plan Note (Signed)
BP elevated today at 164/97 pulse 99. Will defer changes in his hypertensive regimen which includes isosorbide mononitrate 30 mg daily and quinapril 40 mg daily given that he has only had BP measurements greater than 140 systolic twice since 2012.

## 2011-12-05 NOTE — Patient Instructions (Addendum)
It was nice to meet you both today, Cameron Zavala. Today we have provided documentation of your immunizations for immigration purposes. I have refilled your Flexeril muscle relaxer. I recommend a mild lotion for facial irritation after shaving. The hydrocortisone cream can thin the skin if used to often on the face. We will try to schedule you with and Eye Doctor who is closer to [facility] but this will be difficult without medical insurance. In addition, an appointment with the Urologist (Bladder) will take some time. Follow-up with Dr. Loistine Chance in about 3 months.

## 2011-12-05 NOTE — Progress Notes (Signed)
Subjective:     Patient ID: [place], male   DOB: 15-May-1976, 36 y.o.   MRN: [id number]  HPI  Patient presents today for immunizations to be provided for immigration purposes. History significant for paraplegia from T10 down, hypertension, hyperlipidemia, depression, back pain and neuropathy with neurogenic urinary incontinence. Last seen in clinic 11/05/2011. At that time he had referrals for ophthalmology, neural-rehabilitation, urology and was prescribed Flexeril for muscle spasms. Lab tests drawn at that time demonstrated normal magnesium, normal TTS, normal CK, normal basic metabolic panel, and normal urine dipstick. Wife reports that they went to physical therapy and were told that it was not much they can do in terms of helping move her husband legs due to the spasticity. They also wanted the wife to assist with physical therapy at home which she reports she is unable to do. She also reports that she did not go to the eye appointment because it was scheduled in [place] and they do not have transportation. Lastly she is requesting a refill of Flexeril.   Review of Systems  Constitutional: Negative for fever.  HENT: Negative for congestion.   Eyes: Positive for visual disturbance.  Respiratory: Negative for cough and shortness of breath.   Cardiovascular: Negative for chest pain.  Genitourinary: Positive for enuresis.  Musculoskeletal: Positive for back pain.       Objective:   Physical Exam  Constitutional: He is oriented to person, place, and time. He appears well-developed and well-nourished. No distress.       In wheelchair with wife reporting most history  HENT:  Head: Normocephalic and atraumatic.  Eyes: EOM are normal. Pupils are equal, round, and reactive to light.  Neck: Normal range of motion. Neck supple.  Cardiovascular: Normal rate, regular rhythm and normal heart sounds.   Pulmonary/Chest: Effort normal and breath sounds normal.  Neurological: He is  alert and oriented to person, place, and time.  Skin: Skin is warm and dry.       Assessment:     1. Lower paraplegia: stable 2. Back pain: controlled with flexeril 3. Hypertension: elevated today at 160/97 pulse 99 4. Urinary incontinence: awaiting Urology eval    Plan:     See problem list

## 2011-12-05 NOTE — Assessment & Plan Note (Signed)
Still awaiting urology appt

## 2011-12-05 NOTE — Assessment & Plan Note (Signed)
Wife reports that the neuro-rehabilitation was "the same as the other place". Apparently the therapist showed her some stretching exercises to do for her husband at home but she feels as though she is not able to do this correctly and is afraid of hurting him. They have decided not to return to physical therapy. They are requesting a refill of the Flexeril today.

## 2012-02-12 ENCOUNTER — Encounter: Payer: Self-pay | Admitting: *Deleted

## 2012-03-18 ENCOUNTER — Other Ambulatory Visit (HOSPITAL_COMMUNITY): Payer: Self-pay | Admitting: Urology

## 2012-03-18 DIAGNOSIS — Z87442 Personal history of urinary calculi: Secondary | ICD-10-CM

## 2012-03-20 ENCOUNTER — Ambulatory Visit (HOSPITAL_COMMUNITY)
Admission: RE | Admit: 2012-03-20 | Discharge: 2012-03-20 | Disposition: A | Discharge status: Home / Self Care | Payer: Self-pay | Source: Ambulatory Visit | Attending: Urology | Admitting: Urology

## 2012-03-20 ENCOUNTER — Encounter (HOSPITAL_COMMUNITY): Payer: Self-pay

## 2012-03-20 DIAGNOSIS — Z87442 Personal history of urinary calculi: Secondary | ICD-10-CM | POA: Insufficient documentation

## 2012-03-25 ENCOUNTER — Other Ambulatory Visit: Payer: Self-pay | Admitting: *Deleted

## 2012-03-25 DIAGNOSIS — I10 Essential (primary) hypertension: Secondary | ICD-10-CM

## 2012-03-26 MED ORDER — ISOSORBIDE MONONITRATE ER 30 MG PO TB24: 30.0000 mg | ORAL_TABLET | Freq: Every day | ORAL | Status: DC

## 2012-04-02 NOTE — Telephone Encounter (Signed)
Called to pharm 

## 2012-04-03 ENCOUNTER — Other Ambulatory Visit: Payer: Self-pay | Admitting: *Deleted

## 2012-04-03 DIAGNOSIS — M62838 Other muscle spasm: Secondary | ICD-10-CM

## 2012-04-03 DIAGNOSIS — F329 Major depressive disorder, single episode, unspecified: Secondary | ICD-10-CM

## 2012-04-03 DIAGNOSIS — G629 Polyneuropathy, unspecified: Secondary | ICD-10-CM

## 2012-04-03 MED ORDER — FLUOXETINE HCL 40 MG PO CAPS: 40.0000 mg | ORAL_CAPSULE | Freq: Every day | ORAL | Status: DC

## 2012-04-03 MED ORDER — CYCLOBENZAPRINE HCL 10 MG PO TABS: 10.0000 mg | ORAL_TABLET | Freq: Three times a day (TID) | ORAL | Status: DC | PRN

## 2012-04-03 MED ORDER — AMITRIPTYLINE HCL 50 MG PO TABS: 50.0000 mg | ORAL_TABLET | Freq: Every day | ORAL | Status: DC

## 2012-04-07 NOTE — Telephone Encounter (Signed)
Prozac and Amitriptyline refilled- request form faxed to [facility].

## 2012-07-07 ENCOUNTER — Ambulatory Visit (INDEPENDENT_AMBULATORY_CARE_PROVIDER_SITE_OTHER): Payer: Self-pay | Admitting: Internal Medicine

## 2012-07-07 ENCOUNTER — Encounter: Payer: Self-pay | Admitting: Internal Medicine

## 2012-07-07 VITALS — BP 140/88 | HR 90 | Temp 98.8°F

## 2012-07-07 DIAGNOSIS — G8220 Paraplegia, unspecified: Secondary | ICD-10-CM

## 2012-07-07 DIAGNOSIS — N39498 Other specified urinary incontinence: Secondary | ICD-10-CM

## 2012-07-07 DIAGNOSIS — G589 Mononeuropathy, unspecified: Secondary | ICD-10-CM

## 2012-07-07 DIAGNOSIS — E785 Hyperlipidemia, unspecified: Secondary | ICD-10-CM

## 2012-07-07 DIAGNOSIS — I10 Essential (primary) hypertension: Secondary | ICD-10-CM

## 2012-07-07 DIAGNOSIS — G629 Polyneuropathy, unspecified: Secondary | ICD-10-CM

## 2012-07-07 DIAGNOSIS — F329 Major depressive disorder, single episode, unspecified: Secondary | ICD-10-CM

## 2012-07-07 DIAGNOSIS — F32A Depression, unspecified: Secondary | ICD-10-CM

## 2012-07-07 DIAGNOSIS — H538 Other visual disturbances: Secondary | ICD-10-CM

## 2012-07-07 DIAGNOSIS — M62838 Other muscle spasm: Secondary | ICD-10-CM

## 2012-07-07 DIAGNOSIS — K5909 Other constipation: Secondary | ICD-10-CM

## 2012-07-07 DIAGNOSIS — M810 Age-related osteoporosis without current pathological fracture: Secondary | ICD-10-CM

## 2012-07-07 DIAGNOSIS — Z23 Encounter for immunization: Secondary | ICD-10-CM

## 2012-07-07 DIAGNOSIS — F3289 Other specified depressive episodes: Secondary | ICD-10-CM

## 2012-07-07 MED ORDER — FLUOXETINE HCL 40 MG PO CAPS: 40.0000 mg | ORAL_CAPSULE | Freq: Every day | ORAL | Status: DC

## 2012-07-07 MED ORDER — ISOSORBIDE MONONITRATE ER 30 MG PO TB24: 30.0000 mg | ORAL_TABLET | Freq: Every day | ORAL | Status: DC

## 2012-07-07 MED ORDER — CYCLOBENZAPRINE HCL 10 MG PO TABS: 10.0000 mg | ORAL_TABLET | Freq: Two times a day (BID) | ORAL | Status: DC | PRN

## 2012-07-07 MED ORDER — AMITRIPTYLINE HCL 75 MG PO TABS: 75.0000 mg | ORAL_TABLET | Freq: Every day | ORAL | Status: DC

## 2012-07-07 MED ORDER — QUINAPRIL HCL 40 MG PO TABS: 40.0000 mg | ORAL_TABLET | Freq: Every day | ORAL | Status: DC

## 2012-07-07 NOTE — Assessment & Plan Note (Signed)
Blood pressure is at goal today. I will continue quinapril 40 mg and Imdur 30 mg daily.

## 2012-07-07 NOTE — Assessment & Plan Note (Signed)
Received flu shot today. 
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
## 2012-07-07 NOTE — Assessment & Plan Note (Signed)
And increase amitriptyline 50 mg daily to 75 mg daily since patient continues to have pain. Furthermore recommended Flexeril 10 mg daily and ibuprofen when necessary. Will obtain renal function to assure normal kidney function to continue ibuprofen on a regular basis. I explained to the patient that he will continue to have pain and he noted that he understands.

## 2012-07-07 NOTE — Assessment & Plan Note (Signed)
Continue vitamin D and calcium. 

## 2012-07-07 NOTE — Assessment & Plan Note (Signed)
I will obtain lipid panel today. Patient is currently not on any medication.

## 2012-07-07 NOTE — Patient Instructions (Signed)
Continue stool softner

## 2012-07-07 NOTE — Assessment & Plan Note (Signed)
Patient is not using Colace os as prescribed but medications over-the-counter. The wife cannot recall the name. He denies any constipation since starting on stool softener

## 2012-07-08 ENCOUNTER — Telehealth: Payer: Self-pay | Admitting: *Deleted

## 2012-07-08 LAB — CBC
HCT: 48.5 % (ref 39.0–52.0)
MCH: 30.5 pg (ref 26.0–34.0)
MCV: 89.6 fL (ref 78.0–100.0)
RDW: 14.3 % (ref 11.5–15.5)
WBC: 5.6 10*3/uL (ref 4.0–10.5)

## 2012-07-08 LAB — LIPID PANEL
Cholesterol: 206 mg/dL — ABNORMAL HIGH (ref 0–200)
HDL: 33 mg/dL — ABNORMAL LOW (ref >39)
Total CHOL/HDL Ratio: 6.2 Ratio
Triglycerides: 212 mg/dL — ABNORMAL HIGH (ref <150)

## 2012-07-08 LAB — BASIC METABOLIC PANEL
BUN: 13 mg/dL (ref 6–23)
Calcium: 9.3 mg/dL (ref 8.4–10.5)
Chloride: 104 mEq/L (ref 96–112)
Creat: 0.81 mg/dL (ref 0.50–1.35)

## 2012-07-08 NOTE — Telephone Encounter (Signed)
Imdur and Accupril rxs called to [facility] per Dr Loistine Chance. Ophthalmology referral will be faxed to [facility].

## 2012-07-08 NOTE — Telephone Encounter (Signed)
Message copied by Hassan Buckler on Tue Jul 08, 2012  1:55 PM ------      Message from: Almyra Deforest      Created: Mon Jul 07, 2012  6:07 PM      Regarding: Medication and Eye        Hi Naysa Puskas,      1. Can you please call in Accupril and Imdur to the [facility] department            2. Can you please make a followup appointment with ophthalmology for persistent blurry vision            Thanks      Almyra Deforest, MD

## 2012-09-09 ENCOUNTER — Ambulatory Visit: Payer: Self-pay

## 2012-12-01 ENCOUNTER — Ambulatory Visit (INDEPENDENT_AMBULATORY_CARE_PROVIDER_SITE_OTHER): Payer: No Typology Code available for payment source | Admitting: Internal Medicine

## 2012-12-01 ENCOUNTER — Encounter: Payer: Self-pay | Admitting: Internal Medicine

## 2012-12-01 VITALS — BP 146/89 | HR 109 | Temp 98.9°F

## 2012-12-01 DIAGNOSIS — I10 Essential (primary) hypertension: Secondary | ICD-10-CM

## 2012-12-01 LAB — BASIC METABOLIC PANEL
CO2: 27 mEq/L (ref 19–32)
Calcium: 8.9 mg/dL (ref 8.4–10.5)
Chloride: 103 mEq/L (ref 96–112)
Creat: 0.72 mg/dL (ref 0.50–1.35)
Sodium: 137 mEq/L (ref 135–145)

## 2012-12-01 MED ORDER — AMITRIPTYLINE HCL 100 MG PO TABS: 100.0000 mg | ORAL_TABLET | Freq: Every day | ORAL | Status: DC

## 2012-12-01 MED ORDER — NAPROXEN 500 MG PO TABS: 500.0000 mg | ORAL_TABLET | Freq: Two times a day (BID) | ORAL | Status: DC

## 2012-12-01 NOTE — Assessment & Plan Note (Signed)
Pain most likely due to radiculopathy. I will increase amitriptyline from 75 mg 100 mg daily along with naproxen 500 mg twice a day. Will further obtain a basic metabolic panel function.
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
## 2013-01-05 ENCOUNTER — Ambulatory Visit (INDEPENDENT_AMBULATORY_CARE_PROVIDER_SITE_OTHER): Payer: No Typology Code available for payment source | Admitting: Radiation Oncology

## 2013-01-05 ENCOUNTER — Encounter: Payer: Self-pay | Admitting: Radiation Oncology

## 2013-01-05 VITALS — BP 162/102 | HR 99 | Temp 98.3°F

## 2013-01-05 DIAGNOSIS — K5909 Other constipation: Secondary | ICD-10-CM

## 2013-01-05 DIAGNOSIS — I10 Essential (primary) hypertension: Secondary | ICD-10-CM

## 2013-01-05 DIAGNOSIS — G589 Mononeuropathy, unspecified: Secondary | ICD-10-CM

## 2013-01-05 DIAGNOSIS — G629 Polyneuropathy, unspecified: Secondary | ICD-10-CM

## 2013-01-05 DIAGNOSIS — G8220 Paraplegia, unspecified: Secondary | ICD-10-CM

## 2013-01-05 DIAGNOSIS — M62838 Other muscle spasm: Secondary | ICD-10-CM

## 2013-01-05 DIAGNOSIS — Z91018 Allergy to other foods: Secondary | ICD-10-CM

## 2013-01-05 MED ORDER — NAPROXEN 500 MG PO TABS: 500.0000 mg | ORAL_TABLET | Freq: Two times a day (BID) | ORAL | Status: DC

## 2013-01-05 MED ORDER — ISOSORBIDE MONONITRATE ER 30 MG PO TB24: 60.0000 mg | ORAL_TABLET | Freq: Every day | ORAL | Status: DC

## 2013-01-05 MED ORDER — POLYETHYLENE GLYCOL 3350 17 GM/SCOOP PO POWD: 17.0000 g | Freq: Every day | ORAL | Status: DC

## 2013-01-05 MED ORDER — CYCLOBENZAPRINE HCL 10 MG PO TABS: 10.0000 mg | ORAL_TABLET | Freq: Two times a day (BID) | ORAL | Status: DC | PRN

## 2013-01-05 MED ORDER — AMITRIPTYLINE HCL 100 MG PO TABS: 100.0000 mg | ORAL_TABLET | Freq: Every day | ORAL | Status: DC

## 2013-01-05 NOTE — Patient Instructions (Addendum)
   Please begin taking miralax.   Please stop taking senna.   Continue all of your other medications as previously prescribed.   It was a pleasure to meet you. Have a great day.

## 2013-01-05 NOTE — Assessment & Plan Note (Signed)
Not well-controlled on current regimen. Will continue docusate and also start miralax today.

## 2013-01-05 NOTE — Assessment & Plan Note (Signed)
Will order [facility] PT/OT services to assist patient in improving function at home.
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
## 2013-01-05 NOTE — Assessment & Plan Note (Signed)
No spasticity on exam. Unfortunately pt is allergic to baclofen. Will continue flexeril as prescribed by PCP.

## 2013-01-05 NOTE — Assessment & Plan Note (Signed)
BP Readings from Last 3 Encounters:  01/05/13 162/102  12/01/12 146/89  07/07/12 140/88    Lab Results  Component Value Date   NA 137 12/01/2012   K 3.8 12/01/2012   CREATININE 0.72 12/01/2012    Assessment: Blood pressure control: moderately elevated Progress toward BP goal:  deteriorated Comments:   Plan: Medications:  Increase imdur to 60mg  QD and continue quinapril 40mg  QD.  Educational resources provided:   Self management tools provided:   Other plans: RTC in 2 weeks for BP recheck

## 2013-01-06 DIAGNOSIS — Z91018 Allergy to other foods: Secondary | ICD-10-CM | POA: Insufficient documentation

## 2013-01-06 MED ORDER — EPINEPHRINE 0.3 MG/0.3ML IJ DEVI: 0.3000 mg | Freq: Once | INTRAMUSCULAR | Status: DC

## 2013-01-06 NOTE — Addendum Note (Signed)
Addended by: Elenor Legato R on: 01/06/2013 03:25 PM   Modules accepted: Orders

## 2013-01-06 NOTE — Assessment & Plan Note (Signed)
Pt complains of swelling of lips and of eyelids after eating a steak recently. 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 Pt had no difficulty breathing. He has had no prior episodes of similar symptoms.  - avoid foods responsible for symptoms ([place] steaks) - epi pen to use as needed

## 2013-01-21 ENCOUNTER — Encounter: Payer: Self-pay | Admitting: Radiation Oncology

## 2013-01-21 ENCOUNTER — Ambulatory Visit (INDEPENDENT_AMBULATORY_CARE_PROVIDER_SITE_OTHER): Payer: No Typology Code available for payment source | Admitting: Radiation Oncology

## 2013-01-21 VITALS — BP 140/89 | HR 104 | Temp 97.8°F

## 2013-01-21 DIAGNOSIS — M62838 Other muscle spasm: Secondary | ICD-10-CM

## 2013-01-21 DIAGNOSIS — G8220 Paraplegia, unspecified: Secondary | ICD-10-CM

## 2013-01-21 DIAGNOSIS — K5909 Other constipation: Secondary | ICD-10-CM

## 2013-01-21 DIAGNOSIS — I10 Essential (primary) hypertension: Secondary | ICD-10-CM

## 2013-01-21 MED ORDER — TIZANIDINE HCL 4 MG PO TABS: 2.0000 mg | ORAL_TABLET | Freq: Three times a day (TID) | ORAL | Status: DC

## 2013-01-21 MED ORDER — DIAZEPAM 5 MG PO TABS: 5.0000 mg | ORAL_TABLET | Freq: Three times a day (TID) | ORAL | Status: DC | PRN

## 2013-01-21 MED ORDER — EPINEPHRINE 0.3 MG/0.3ML IJ SOAJ: 0.3000 mg | Freq: Once | INTRAMUSCULAR | Status: AC | PRN

## 2013-01-21 NOTE — Patient Instructions (Addendum)
Take diazepam as prescribed today for spasticity.  Continue taking all of your other medications as prescribed.   Have an excellent time on your trip to [place]. Give [place] a call if you need anything else for [place] before you leave. Have a great day.
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
## 2013-01-22 NOTE — Assessment & Plan Note (Signed)
Improved after starting miralax.  -cont current regimen

## 2013-01-22 NOTE — Progress Notes (Signed)
Case discussed with Dr. McTyre at time of visit.  We reviewed the resident's history and exam and pertinent patient test results.  I agree with the assessment, diagnosis, and plan of care documented in the resident's note. 

## 2013-01-22 NOTE — Assessment & Plan Note (Signed)
After further discussing the issue during today's visit, will add an anti-spasmodic in an attempt to help improve spasticity given it continues to impede his functioning at home. It appears he has not tried tizanidine or valium in the past. Tizanidine would be preferred, however pt does not have insurance and cannot afford this medication currently. Will start diazepam and assess response. Pt was counseled on the sedating effects of this medication which could be exacerbated by his flexeril.  - diazepam 5mg  tid - if symptoms improve, consider d/c flexeril at next visit - referred for [facility] PT/OT

## 2013-01-22 NOTE — Assessment & Plan Note (Signed)
Pt provided with a letter stating his medical need for his wheelchair and medications (not required by [employer] but pt felt he might need when returning from [place]). He was instructed to call [employer] ~72hrs prior to his departure to ensure timely security clearance.

## 2013-01-22 NOTE — Assessment & Plan Note (Signed)
BP Readings from Last 3 Encounters:  01/21/13 140/89  01/05/13 162/102  12/01/12 146/89    Lab Results  Component Value Date   NA 137 12/01/2012   K 3.8 12/01/2012   CREATININE 0.72 12/01/2012    Assessment: Blood pressure control: controlled Progress toward BP goal:  at goal Comments: improved after increasing imdur 30mg  => 60mg  daily  Plan: Medications:  continue current medications Educational resources provided: brochure Self management tools provided: home blood pressure logbook Other plans:

## 2013-01-22 NOTE — Addendum Note (Signed)
Addended by: Elenor Legato R on: 01/22/2013 10:05 AM   Modules accepted: Orders

## 2013-01-26 NOTE — Progress Notes (Signed)
INTERNAL MEDICINE TEACHING ATTENDING ADDENDUM - Inez Catalina, MD: I reviewed with the resident Dr. Lavena Bullion, Ms. Ponce-Medina's  medical history, physical examination, diagnosis and results of tests and treatment and I agree with the patient's care as documented.

## 2013-02-12 ENCOUNTER — Ambulatory Visit: Payer: No Typology Code available for payment source | Attending: Radiation Oncology | Admitting: Physical Therapy

## 2013-02-12 DIAGNOSIS — M6281 Muscle weakness (generalized): Secondary | ICD-10-CM | POA: Insufficient documentation

## 2013-02-12 DIAGNOSIS — IMO0001 Reserved for inherently not codable concepts without codable children: Secondary | ICD-10-CM | POA: Insufficient documentation

## 2013-02-12 DIAGNOSIS — M2420 Disorder of ligament, unspecified site: Secondary | ICD-10-CM | POA: Insufficient documentation

## 2013-02-12 DIAGNOSIS — M629 Disorder of muscle, unspecified: Secondary | ICD-10-CM | POA: Insufficient documentation

## 2013-02-26 ENCOUNTER — Ambulatory Visit: Payer: No Typology Code available for payment source | Admitting: Physical Therapy

## 2013-03-05 ENCOUNTER — Ambulatory Visit: Payer: No Typology Code available for payment source | Admitting: Physical Therapy

## 2013-03-11 ENCOUNTER — Ambulatory Visit: Payer: Self-pay

## 2013-06-16 ENCOUNTER — Other Ambulatory Visit: Payer: Self-pay | Admitting: *Deleted

## 2013-06-16 DIAGNOSIS — F329 Major depressive disorder, single episode, unspecified: Secondary | ICD-10-CM

## 2013-06-17 MED ORDER — FLUOXETINE HCL 40 MG PO CAPS: 40.0000 mg | ORAL_CAPSULE | Freq: Every day | ORAL | Status: DC

## 2013-06-17 NOTE — Telephone Encounter (Signed)
I will refill this medication once. Patient will need to be seen in the clinic for me to continue prescribing this medication as I am unfamiliar with this patient.

## 2013-06-17 NOTE — Telephone Encounter (Signed)
Prozac rx called to [facility]. Message sent to front office to schedule pt an appt.

## 2013-07-29 ENCOUNTER — Ambulatory Visit (INDEPENDENT_AMBULATORY_CARE_PROVIDER_SITE_OTHER): Payer: No Typology Code available for payment source | Admitting: Internal Medicine

## 2013-07-29 VITALS — BP 141/91 | HR 102 | Temp 98.3°F

## 2013-07-29 DIAGNOSIS — G589 Mononeuropathy, unspecified: Secondary | ICD-10-CM

## 2013-07-29 DIAGNOSIS — Z23 Encounter for immunization: Secondary | ICD-10-CM

## 2013-07-29 DIAGNOSIS — B354 Tinea corporis: Secondary | ICD-10-CM

## 2013-07-29 DIAGNOSIS — I10 Essential (primary) hypertension: Secondary | ICD-10-CM

## 2013-07-29 DIAGNOSIS — G629 Polyneuropathy, unspecified: Secondary | ICD-10-CM

## 2013-07-29 DIAGNOSIS — Z0000 Encounter for general adult medical examination without abnormal findings: Secondary | ICD-10-CM

## 2013-07-29 DIAGNOSIS — F3289 Other specified depressive episodes: Secondary | ICD-10-CM

## 2013-07-29 DIAGNOSIS — G8220 Paraplegia, unspecified: Secondary | ICD-10-CM

## 2013-07-29 DIAGNOSIS — M62838 Other muscle spasm: Secondary | ICD-10-CM

## 2013-07-29 DIAGNOSIS — F32A Depression, unspecified: Secondary | ICD-10-CM

## 2013-07-29 DIAGNOSIS — F329 Major depressive disorder, single episode, unspecified: Secondary | ICD-10-CM

## 2013-07-29 MED ORDER — AMITRIPTYLINE HCL 100 MG PO TABS: 100.0000 mg | ORAL_TABLET | Freq: Every day | ORAL | Status: DC

## 2013-07-29 MED ORDER — FLUOXETINE HCL 40 MG PO CAPS: 40.0000 mg | ORAL_CAPSULE | Freq: Every day | ORAL | Status: DC

## 2013-07-29 MED ORDER — ISOSORBIDE MONONITRATE ER 30 MG PO TB24: 60.0000 mg | ORAL_TABLET | Freq: Every day | ORAL | Status: DC

## 2013-07-29 MED ORDER — CYCLOBENZAPRINE HCL 10 MG PO TABS: 10.0000 mg | ORAL_TABLET | Freq: Two times a day (BID) | ORAL | Status: AC | PRN

## 2013-07-29 MED ORDER — POLYETHYLENE GLYCOL 3350 17 GM/SCOOP PO POWD: 17.0000 g | Freq: Every day | ORAL | Status: DC

## 2013-07-29 MED ORDER — NAPROXEN 500 MG PO TABS: 500.0000 mg | ORAL_TABLET | Freq: Two times a day (BID) | ORAL | Status: DC

## 2013-07-29 MED ORDER — KETOCONAZOLE 2 % EX CREA: 1.0000 "application " | TOPICAL_CREAM | Freq: Two times a day (BID) | CUTANEOUS | Status: DC

## 2013-07-29 MED ORDER — DOCUSATE SODIUM 100 MG PO CAPS: 100.0000 mg | ORAL_CAPSULE | Freq: Two times a day (BID) | ORAL | Status: AC

## 2013-07-29 MED ORDER — QUINAPRIL HCL 40 MG PO TABS: 40.0000 mg | ORAL_TABLET | Freq: Every day | ORAL | Status: DC

## 2013-07-29 MED ORDER — DIAZEPAM 5 MG PO TABS: 5.0000 mg | ORAL_TABLET | Freq: Three times a day (TID) | ORAL | Status: DC | PRN

## 2013-07-29 NOTE — Assessment & Plan Note (Signed)
Patient with an itchy red rash on his neck for the past 4 weeks. On exam., the annular nature of the rash with scale on the leading edge leads me to believe this is caused by a dermatophyte infection. However, the guttate nature of the rash is slightly atypical for tinea corporis. Other items on the differential includes seborrheic dermatitis versus psoriasis versus erythema annulare centrifugum. While patient does have some mild scale in his scalp and perhaps mild scale to his bilateral eyebrows and beard area, there is no associated erythema and the focal nature of the rash leads me to believe this is not seborrheic dermatitis. There are no other skin lesions such as on his extensor surfaces or his sacral area therefore I do not believe this is psoriasis however it is a consideration. I will start by treating with ketoconazole 2% cream twice a day for 4 weeks. I will see patient back in 4 weeks and if his rash is not improved, I will initiate a different approach such as systemic antifungal.
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
## 2013-07-29 NOTE — Assessment & Plan Note (Signed)
Patient reports having bilateral lower from the spasticity at baseline secondary to his paraplegia. While his spasms are not painful because he has no feeling below the waist, it does inhibit his function. Given patient never did try the Valium, I will prescribe the Valium today as well as continue Flexeril up to twice daily. I encouraged patient to go slow when starting the Valium as this can make him drowsy especially when combined with the Flexeril. I recommended starting with one Valium pill per day and increasing from there. If Valium does successfully reduce patient's symptoms, I will plan to either decrease or discontinue the Flexeril at his next visit. Patient and wife report that they used to be seen by home health PT and OT, however they no longer receive these services. Apparently, the home health PT and OT recommended outpatient PT for patient. Patient expresses dissatisfaction with the outpatient PT, therefore he has not received PT in over 3 months. I ordered a consult for our social worker, Fredonia Highland, in order to attempt to get home health PT and OT set up for patient.

## 2013-07-29 NOTE — Patient Instructions (Signed)
Thank you for your visit. Please follow up with me in 4 weeks or sooner if needed.  Today I refilled all your medications. I also added Valium 5 mg 3 times a day when necessary to her regimen for your spasticity. This medication can make you drowsy, so I suggest starting slow with perhaps one pill in the morning and increasing from there. You can also take Flexeril twice daily as opposed to just once daily as you have been. I contacted the social worker, Fredonia Highland, who should be contacting you with further information regarding home health PT and OT.  Your blood pressure looks good today. Please continue taking your blood pressure medications as you have been.  For your rash, I prescribed ketoconazole cream to be applied twice daily for 4-6 weeks.

## 2013-07-29 NOTE — Assessment & Plan Note (Signed)
Patient received flu shot today 

## 2013-07-30 ENCOUNTER — Telehealth: Payer: Self-pay | Admitting: Licensed Clinical Social Worker

## 2013-07-30 NOTE — Telephone Encounter (Signed)
Pt is requesting home health PT/OT.  [name] is currently uninsured.  Home health services not available at this time, only outpatient services.  CSW placed called to pt.  CSW left message requesting return call. CSW provided contact hours and phone number.  CSW will provide patient information to determine if he is eligible for disability.  Pt will need to contact [employer] [place]) documents, including form [phone] (Permanent Resident Card, commonly known as a "Green Card") to verify your 9-digit Alien Registration Number (A-Number). If pt has a DHS form I-94, Admission-Departure Record, will need to see that document to verify your 11-digit Admission Number.

## 2013-07-30 NOTE — Telephone Encounter (Signed)
CSW received call back from pt's spouse, [place] speaking. Spouse states ambulatory PT did not provide the extent of services they were looking for to assist with transfers.  Spouse did not pursue Disability based on being pt had to be a [place] Citizen.  CSW informed spouse of "qualified alien" status.  CSW informed spouse will send information in the mail.

## 2013-07-31 NOTE — Progress Notes (Signed)
I saw and evaluated the patient.  I personally confirmed the key portions of the history and exam documented by Dr. Chikowski and I reviewed pertinent patient test results.  The assessment, diagnosis, and plan were formulated together and I agree with the documentation in the resident's note. 

## 2013-08-04 NOTE — Telephone Encounter (Signed)
CSW received confirmation [facility] [employer] [facility] has contact person that will be able to assist those that have VISA/Green card and questions regarding disability.  CSW will send information to pt and provide with contact person at [employer].  CSW left message with pt/family regarding contact name and number at [facility].

## 2013-08-18 ENCOUNTER — Ambulatory Visit: Payer: No Typology Code available for payment source | Admitting: Internal Medicine

## 2013-08-18 ENCOUNTER — Ambulatory Visit (INDEPENDENT_AMBULATORY_CARE_PROVIDER_SITE_OTHER): Payer: No Typology Code available for payment source | Admitting: Internal Medicine

## 2013-08-18 ENCOUNTER — Encounter: Payer: Self-pay | Admitting: Internal Medicine

## 2013-08-18 VITALS — BP 170/95 | HR 107 | Temp 98.7°F

## 2013-08-18 DIAGNOSIS — R21 Rash and other nonspecific skin eruption: Secondary | ICD-10-CM

## 2013-08-18 DIAGNOSIS — I10 Essential (primary) hypertension: Secondary | ICD-10-CM

## 2013-08-18 DIAGNOSIS — G629 Polyneuropathy, unspecified: Secondary | ICD-10-CM

## 2013-08-18 MED ORDER — AMITRIPTYLINE HCL 100 MG PO TABS: 100.0000 mg | ORAL_TABLET | Freq: Every day | ORAL | Status: DC

## 2013-08-18 NOTE — Patient Instructions (Signed)
Thank you for your visit.  I am happy with the results of the ketoconazole cream. I think it is really helping to clear up your rash. Please continue to use this medication twice daily up to 6 weeks.  Please return to clinic in 6 months, or sooner if needed.

## 2013-08-18 NOTE — Assessment & Plan Note (Signed)
Much improvement of rash after three weeks of ketoconazole cream. I encouraged patient to use the cream up to twice per day as he had only been using it once per day. I recommeded that he use it on the affected areas for 6 weeks to prevent recurrence. I also suggested that he try using it behind his R ear as he does have persistent rash in this area and he had not been using the cream there.
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
## 2013-08-19 ENCOUNTER — Encounter: Payer: No Typology Code available for payment source | Admitting: Internal Medicine

## 2013-08-19 NOTE — Progress Notes (Signed)
I saw and evaluated the patient.  I personally confirmed the key portions of the history and exam documented by Dr. Chikowski and I reviewed pertinent patient test results.  The assessment, diagnosis, and plan were formulated together and I agree with the documentation in the resident's note. 

## 2013-10-01 ENCOUNTER — Ambulatory Visit: Payer: Self-pay

## 2013-11-03 ENCOUNTER — Other Ambulatory Visit: Payer: Self-pay | Admitting: *Deleted

## 2013-11-03 DIAGNOSIS — F3289 Other specified depressive episodes: Secondary | ICD-10-CM

## 2013-11-03 DIAGNOSIS — F32A Depression, unspecified: Secondary | ICD-10-CM

## 2013-11-03 DIAGNOSIS — F329 Major depressive disorder, single episode, unspecified: Secondary | ICD-10-CM

## 2013-11-04 MED ORDER — FLUOXETINE HCL 40 MG PO CAPS: 40.0000 mg | ORAL_CAPSULE | Freq: Every day | ORAL | Status: DC

## 2013-11-04 NOTE — Telephone Encounter (Signed)
Called to pharm 

## 2014-02-22 ENCOUNTER — Encounter: Payer: Self-pay | Admitting: Internal Medicine

## 2014-03-04 ENCOUNTER — Telehealth: Payer: Self-pay | Admitting: *Deleted

## 2014-03-04 NOTE — Telephone Encounter (Signed)
Fax from [facility] - Accupril 40mg  is no longer available for free - consider changing to Accurectic or Benicar? Send new rx    Thanks

## 2014-03-05 ENCOUNTER — Other Ambulatory Visit: Payer: Self-pay | Admitting: Internal Medicine

## 2014-03-05 DIAGNOSIS — I10 Essential (primary) hypertension: Secondary | ICD-10-CM

## 2014-03-05 MED ORDER — QUINAPRIL HCL 40 MG PO TABS: 40.0000 mg | ORAL_TABLET | Freq: Every day | ORAL | Status: DC

## 2014-03-05 MED ORDER — OLMESARTAN 10 MG HALF TABLET: 20.0000 mg | ORAL_TABLET | Freq: Every day | ORAL | Status: DC

## 2014-03-05 NOTE — Telephone Encounter (Signed)
I switched patient over to Benicar. I will see him in the office on Monday as previously scheduled for a BP recheck.

## 2014-03-05 NOTE — Telephone Encounter (Signed)
[facility] informed of new rx for Benicar 10 mg per Dr Darci Needle.

## 2014-03-08 ENCOUNTER — Encounter: Payer: Self-pay | Admitting: Internal Medicine

## 2014-04-26 ENCOUNTER — Ambulatory Visit: Payer: No Typology Code available for payment source

## 2014-07-09 ENCOUNTER — Ambulatory Visit (INDEPENDENT_AMBULATORY_CARE_PROVIDER_SITE_OTHER): Payer: Self-pay | Admitting: Internal Medicine

## 2014-07-09 ENCOUNTER — Encounter: Payer: Self-pay | Admitting: Internal Medicine

## 2014-07-09 VITALS — BP 162/90 | HR 103 | Temp 98.1°F

## 2014-07-09 DIAGNOSIS — Z9189 Other specified personal risk factors, not elsewhere classified: Secondary | ICD-10-CM

## 2014-07-09 DIAGNOSIS — G8220 Paraplegia, unspecified: Secondary | ICD-10-CM

## 2014-07-09 DIAGNOSIS — G969 Disorder of central nervous system, unspecified: Secondary | ICD-10-CM

## 2014-07-09 DIAGNOSIS — E785 Hyperlipidemia, unspecified: Secondary | ICD-10-CM

## 2014-07-09 DIAGNOSIS — M792 Neuralgia and neuritis, unspecified: Secondary | ICD-10-CM

## 2014-07-09 DIAGNOSIS — Z87898 Personal history of other specified conditions: Secondary | ICD-10-CM

## 2014-07-09 DIAGNOSIS — I10 Essential (primary) hypertension: Secondary | ICD-10-CM

## 2014-07-09 DIAGNOSIS — Z23 Encounter for immunization: Secondary | ICD-10-CM

## 2014-07-09 DIAGNOSIS — Z0000 Encounter for general adult medical examination without abnormal findings: Secondary | ICD-10-CM

## 2014-07-09 DIAGNOSIS — M62838 Other muscle spasm: Secondary | ICD-10-CM

## 2014-07-09 LAB — BASIC METABOLIC PANEL WITH GFR
BUN: 16 mg/dL (ref 6–23)
CO2: 26 mEq/L (ref 19–32)
Calcium: 9.3 mg/dL (ref 8.4–10.5)
Chloride: 102 mEq/L (ref 96–112)
Creat: 0.87 mg/dL (ref 0.50–1.35)
GFR, Est Non African American: >89 mL/min
Glucose, Bld: 137 mg/dL — ABNORMAL HIGH (ref 70–99)
POTASSIUM: 4.5 meq/L (ref 3.5–5.3)
Sodium: 138 mEq/L (ref 135–145)

## 2014-07-09 LAB — LIPID PANEL
CHOLESTEROL: 198 mg/dL (ref 0–200)
HDL: 30 mg/dL — AB (ref >39)
LDL Cholesterol: 118 mg/dL — ABNORMAL HIGH (ref 0–99)
TRIGLYCERIDES: 248 mg/dL — AB (ref <150)
Total CHOL/HDL Ratio: 6.6 Ratio
VLDL: 50 mg/dL — ABNORMAL HIGH (ref 0–40)

## 2014-07-09 LAB — CBC
HCT: 45.6 % (ref 39.0–52.0)
Hemoglobin: 16 g/dL (ref 13.0–17.0)
MCH: 30.5 pg (ref 26.0–34.0)
MCHC: 35.1 g/dL (ref 30.0–36.0)
MCV: 86.9 fL (ref 78.0–100.0)
Platelets: 175 10*3/uL (ref 150–400)
RBC: 5.25 MIL/uL (ref 4.22–5.81)
RDW: 14.2 % (ref 11.5–15.5)
WBC: 5.8 10*3/uL (ref 4.0–10.5)

## 2014-07-09 MED ORDER — OLMESARTAN MEDOXOMIL-HCTZ 20-12.5 MG PO TABS: 1.0000 | ORAL_TABLET | Freq: Every day | ORAL | Status: DC

## 2014-07-09 NOTE — Patient Instructions (Addendum)
Please pick up the following medications from the [facility]:  Benicar/HCTZ: take once a day  For your other medications, please note the following changes:  Flexiril 10mg  THREE times a day   Elavil 150mg  (1.5 tablets a day)  General Instructions: Thank you for bringing your medicines today. This helps [place] keep you safe from mistakes.  Progress Toward Treatment Goals:  Treatment Goal 07/09/2014  Blood pressure unchanged    Self Care Goals & Plans:  Self Care Goal 07/29/2013  Manage my medications take my medicines as prescribed; bring my medications to every visit; refill my medications on time  Eat healthy foods drink diet soda or water instead of juice or soda; eat more vegetables; eat foods that are low in salt; eat baked foods instead of fried foods  Be physically active -    No flowsheet data found.   Care Management & Community Referrals:  No flowsheet data found.

## 2014-07-09 NOTE — Assessment & Plan Note (Signed)
His wife reports difficulty in working with PT. Per EHR, there were some issues with her husband's citizenship status. Also, she felt the rehab he was able to get was simplistic, like learning how to transfer, as he could do that already and that he needed someone to work with his lower limbs to avoid spasticity . Flexiril appears to help.  Assessment -Needs ongoing PT as much as he can tolerate to avoid further spasticity   Plan -Increase Flexiril dose 10mg  BID->TID  -Check with Social Work regarding what happened last year with setting up Home PT -Follow-up in 7 weeks
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
## 2014-07-09 NOTE — Assessment & Plan Note (Signed)
He is not taking Imdur due to the [facility] not paying for it. He takes Benicar 20mg  a day and receives it free from them. His BP was also elevated when he checked it at [facility] a few weeks ago.   BP Readings from Last 3 Encounters:  07/09/14 162/90  08/18/13 170/95  07/29/13 141/91    Lab Results  Component Value Date   NA 137 12/01/2012   K 3.8 12/01/2012   CREATININE 0.72 12/01/2012    Assessment: Blood pressure control: moderately elevated Progress toward BP goal:  unchanged Comments: Needs increase in dose but dependent on cost  Plan: Medications:  continue current medications, Benicar 20mg  Educational resources provided:   Self management tools provided:   Other plans:  -Spoke to [facility] Department: Benicar/HCTZ 20mg /12.5mg  is free -He and his wife left before giving them paper prescription but had triage RN call in prescription -Plan to recheck in 7 weeks

## 2014-07-09 NOTE — Assessment & Plan Note (Addendum)
-  Check BMP & CBC

## 2014-07-09 NOTE — Assessment & Plan Note (Addendum)
Last lipid panel 2 years ago was above upper limit of normal though not the 90th percentile for [place]American men: 257 (per NHANES III).   Lipid Panel     Component Value Date/Time   CHOL 206* 07/07/2012 1646   TRIG 212* 07/07/2012 1646   HDL 33* 07/07/2012 1646   CHOLHDL 6.2 07/07/2012 1646   VLDL 42* 07/07/2012 1646   LDLCALC 131* 07/07/2012 1646   Assessment -Given CV risk factors of 0-1, LDL goal <160  Plan -Repeat lipid panel today -If LDL elevated, consider lifestyle modification given his response to statin therapy  ADDENDUM 07/11/2014 5:43 PM:  BMET    Component Value Date/Time   NA 138 07/09/2014 1528   K 4.5 07/09/2014 1528   CL 102 07/09/2014 1528   CO2 26 07/09/2014 1528   GLUCOSE 137* 07/09/2014 1528   BUN 16 07/09/2014 1528   CREATININE 0.87 07/09/2014 1528   CREATININE 0.60 08/14/2010 2239   CALCIUM 9.3 07/09/2014 1528   GFRNONAA >89 07/09/2014 1528   GFRAA >89 07/09/2014 1528   Lipid Panel     Component Value Date/Time   CHOL 198 07/09/2014 1528   TRIG 248* 07/09/2014 1528   HDL 30* 07/09/2014 1528   CHOLHDL 6.6 07/09/2014 1528   VLDL 50* 07/09/2014 1528   LDLCALC 118* 07/09/2014 1528   -Changed diagnosis to metabolic syndrome since he meets 3/5 criteria based off hyperglycemia, elevated systolic BP, and low HDL -Defer calculating Framingham score given its limited generalizability -Will check A1c at next visit as well as family history for other risk factors
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
## 2014-07-09 NOTE — Assessment & Plan Note (Signed)
-  Increased flexiril 10mg  BID->TID

## 2014-07-09 NOTE — Assessment & Plan Note (Signed)
He is taking Vitamin D & calcium supplementation. Per wife, rationale is because he cannot be in the sun due to photosensitivity reactions related to Benicar.  Assessment -Not osteoporotic but at risk given immobilization and decreased sun exposure from misinformation  Plan -Counsel him and his wife at follow-up appointment -Repeat DEXA

## 2014-08-27 ENCOUNTER — Encounter: Payer: Self-pay | Admitting: Internal Medicine

## 2014-08-27 ENCOUNTER — Ambulatory Visit (INDEPENDENT_AMBULATORY_CARE_PROVIDER_SITE_OTHER): Payer: Self-pay | Admitting: Internal Medicine

## 2014-08-27 VITALS — BP 180/102 | HR 90 | Temp 98.1°F

## 2014-08-27 DIAGNOSIS — F32A Depression, unspecified: Secondary | ICD-10-CM

## 2014-08-27 DIAGNOSIS — E8881 Metabolic syndrome: Secondary | ICD-10-CM

## 2014-08-27 DIAGNOSIS — G5791 Unspecified mononeuropathy of right lower limb: Secondary | ICD-10-CM

## 2014-08-27 DIAGNOSIS — I10 Essential (primary) hypertension: Secondary | ICD-10-CM

## 2014-08-27 DIAGNOSIS — M792 Neuralgia and neuritis, unspecified: Secondary | ICD-10-CM

## 2014-08-27 DIAGNOSIS — G8220 Paraplegia, unspecified: Secondary | ICD-10-CM

## 2014-08-27 DIAGNOSIS — H9202 Otalgia, left ear: Secondary | ICD-10-CM

## 2014-08-27 DIAGNOSIS — G629 Polyneuropathy, unspecified: Secondary | ICD-10-CM

## 2014-08-27 DIAGNOSIS — F329 Major depressive disorder, single episode, unspecified: Secondary | ICD-10-CM

## 2014-08-27 MED ORDER — LISINOPRIL-HYDROCHLOROTHIAZIDE 10-12.5 MG PO TABS: 1.0000 | ORAL_TABLET | Freq: Every day | ORAL | Status: DC

## 2014-08-27 NOTE — Patient Instructions (Addendum)
  General Instructions:  If you ever need refills, please call our clinic [phone].   STOP taking Benicar. Please refill new medication at [facility] and start taking one pill a day.  Please return next week for follow-up appointment.    Thank you for bringing your medicines today. This helps [place] keep you safe from mistakes.   Progress Toward Treatment Goals:  Treatment Goal 07/09/2014  Blood pressure unchanged    Self Care Goals & Plans:  Self Care Goal 07/09/2014  Manage my medications take my medicines as prescribed; bring my medications to every visit; refill my medications on time  Monitor my health keep track of my blood pressure  Eat healthy foods -  Be physically active -  Meeting treatment goals maintain the current self-care plan    No flowsheet data found.   Care Management & Community Referrals:  Referral 07/09/2014  Referrals made for care management support social worker
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
## 2014-08-28 NOTE — Assessment & Plan Note (Signed)
Overview He reports being out of his medication for a month and being unable to refill it. Per his wife's note, she reports that his last [facility] or [facility], the two locations he fills his medications. Initial BP 230/113. He denies any headache, change in vision, chest pain, shortness of breath.   BP Readings from Last 3 Encounters:  08/27/14 180/102  07/09/14 162/90  08/18/13 170/95    Lab Results  Component Value Date   NA 138 07/09/2014   K 4.5 07/09/2014   CREATININE 0.87 07/09/2014    Assessment: Blood pressure control: severely elevated Progress toward BP goal:  unchanged Comments: Hypertensive urgency. He will need to pick up a medication today to better control his BP.   Plan: Medications:  olmesartan 20mg  Educational resources provided:   Self management tools provided:   Other plans:  -Switch to lisinopril 10/HCTZ 12.5 as it's on the [facility] $4 list -Plan to follow-up in 1 week for a BP recheck  -Advised him to call the clinic should he ever need refills or have problems procuring his medications

## 2014-08-29 DIAGNOSIS — H60393 Other infective otitis externa, bilateral: Secondary | ICD-10-CM | POA: Insufficient documentation

## 2014-08-29 MED ORDER — AMITRIPTYLINE HCL 100 MG PO TABS: 150.0000 mg | ORAL_TABLET | Freq: Every day | ORAL | Status: DC

## 2014-08-29 NOTE — Assessment & Plan Note (Addendum)
He reports today that his pain is not improved on amitryptiline 150mg  though I suspect that he may not have increased his dose. Plan to reassess at follow-up visit.  He is also on Naproxen though unsure why as it would not be of help in neuropathic pain.

## 2014-08-29 NOTE — Assessment & Plan Note (Addendum)
Overview He has been having intermittent pain in the L ear over the last few weeks, worse at night. He does report he has had some decreased hearing on that side but denies any fever, headache, drainage. He has tried to clean it with some peroxide at home.  Assessment -Physical exam findings remarkable only for wax -Less likely an infection  Plan -Irrigate his ear -Print him a coupon for Debrox ear drops  -Reassess at follow-up visit in 1 week

## 2014-08-29 NOTE — Assessment & Plan Note (Addendum)
Per EHR, he is uninsured and ineligible for home PT. He could qualify for outpatient PT. Deferred inquiring about Flexiril at this visit given his BP but will need to see if TID dosing helped.

## 2014-08-30 NOTE — Progress Notes (Signed)
INTERNAL MEDICINE TEACHING ATTENDING ADDENDUM - Earl Lagos, MD: I personally saw and evaluated [name] in this clinic visit in conjunction with the resident, Dr. Allena Katz. I have discussed patient's plan of care with medical resident during this visit. I have confirmed the physical exam findings and have read and agree with the clinic note including the plan with the following addition: - Pt with elevated SBP to 180s in [facility]. Will need follow up in 1 week - Started him on lisinopril/HCTZ. Olmesartan was stopped - pt also complained of left ear pain and had wax on exam. Ear was irrigated and debrox prescribed. Will reassess at follow up

## 2014-09-03 ENCOUNTER — Ambulatory Visit: Payer: Self-pay | Admitting: Internal Medicine

## 2014-09-08 ENCOUNTER — Ambulatory Visit (INDEPENDENT_AMBULATORY_CARE_PROVIDER_SITE_OTHER): Payer: Self-pay | Admitting: Internal Medicine

## 2014-09-08 ENCOUNTER — Encounter: Payer: Self-pay | Admitting: Internal Medicine

## 2014-09-08 VITALS — BP 171/113 | HR 99 | Temp 98.4°F

## 2014-09-08 DIAGNOSIS — I10 Essential (primary) hypertension: Secondary | ICD-10-CM

## 2014-09-08 DIAGNOSIS — G629 Polyneuropathy, unspecified: Secondary | ICD-10-CM

## 2014-09-08 MED ORDER — NAPROXEN 500 MG PO TABS: 500.0000 mg | ORAL_TABLET | Freq: Two times a day (BID) | ORAL | Status: DC

## 2014-09-08 MED ORDER — CYCLOBENZAPRINE HCL 10 MG PO TABS: 10.0000 mg | ORAL_TABLET | Freq: Three times a day (TID) | ORAL | Status: DC | PRN

## 2014-09-08 MED ORDER — LISINOPRIL-HYDROCHLOROTHIAZIDE 20-25 MG PO TABS: 1.0000 | ORAL_TABLET | Freq: Every day | ORAL | Status: DC
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
## 2014-09-08 NOTE — Patient Instructions (Signed)
General Instructions:  Please check your blood pressure when you go to [facility] or any of the drug stores, write it down, with the date, and bring it to your next visit.  Also reduce salt in your diet.  Thank you for bringing your medicines today. This helps [place] keep you safe from mistakes.
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
## 2014-09-09 LAB — BASIC METABOLIC PANEL
BUN: 16 mg/dL (ref 6–23)
CHLORIDE: 101 meq/L (ref 96–112)
CO2: 28 mEq/L (ref 19–32)
Calcium: 9.2 mg/dL (ref 8.4–10.5)
Creat: 0.75 mg/dL (ref 0.50–1.35)
GLUCOSE: 93 mg/dL (ref 70–99)
POTASSIUM: 3.8 meq/L (ref 3.5–5.3)
Sodium: 135 mEq/L (ref 135–145)

## 2014-09-09 LAB — HEMOGLOBIN A1C
Hgb A1c MFr Bld: 5.8 % — ABNORMAL HIGH (ref <5.7)
Mean Plasma Glucose: 120 mg/dL — ABNORMAL HIGH (ref <117)

## 2014-09-09 NOTE — Progress Notes (Signed)
Case discussed with Dr. Emokpae soon after the resident saw the patient.  We reviewed the resident's history and exam and pertinent patient test results.  I agree with the assessment, diagnosis and plan of care documented in the resident's note. 

## 2014-10-05 ENCOUNTER — Encounter: Payer: Self-pay | Admitting: Internal Medicine

## 2014-10-05 ENCOUNTER — Ambulatory Visit (INDEPENDENT_AMBULATORY_CARE_PROVIDER_SITE_OTHER): Payer: Self-pay | Admitting: Internal Medicine

## 2014-10-05 VITALS — BP 172/92 | HR 110 | Temp 98.9°F

## 2014-10-05 DIAGNOSIS — I10 Essential (primary) hypertension: Secondary | ICD-10-CM

## 2014-10-05 DIAGNOSIS — H9192 Unspecified hearing loss, left ear: Secondary | ICD-10-CM

## 2014-10-05 DIAGNOSIS — H918X2 Other specified hearing loss, left ear: Secondary | ICD-10-CM

## 2014-10-05 DIAGNOSIS — H9191 Unspecified hearing loss, right ear: Secondary | ICD-10-CM

## 2014-10-05 MED ORDER — AMLODIPINE BESYLATE 5 MG PO TABS: 5.0000 mg | ORAL_TABLET | Freq: Every day | ORAL | Status: DC
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
## 2014-10-05 NOTE — Patient Instructions (Signed)
General Instructions: I want you to start taking Amlodipine 5mg  a day in addition to your other medications.  I have referred you to an ENT (Ears, Nose, Throat) doctor to look at your ear.  Thank you for bringing your medicines today. This helps [place] keep you safe from mistakes.   Progress Toward Treatment Goals:  Treatment Goal 08/27/2014  Blood pressure unchanged    Self Care Goals & Plans:  Self Care Goal 07/09/2014  Manage my medications take my medicines as prescribed; bring my medications to every visit; refill my medications on time  Monitor my health keep track of my blood pressure  Eat healthy foods -  Be physically active -  Meeting treatment goals maintain the current self-care plan    No flowsheet data found.   Care Management & Community Referrals:  Referral 08/27/2014  Referrals made for care management support none needed

## 2014-10-07 NOTE — Progress Notes (Signed)
INTERNAL MEDICINE TEACHING ATTENDING ADDENDUM - Cameron Lagos, MD: I personally saw and evaluated [name] in this clinic visit in conjunction with the resident, Dr. Mikey Zavala. I have discussed patient's plan of care with medical resident during this visit. I have confirmed the physical exam findings and have read and agree with the clinic note including the plan with the following addition: - Pt with exam concerning for possible perforation of left TM - Will refer to ENT for further eval

## 2014-10-07 NOTE — Assessment & Plan Note (Signed)
-  Patient with hearing loss in left ear after treatment with debrox.  Unable to fully visualize TM and I have some concern about a perforation.  Will refer patient to ENT for evaluation. -D/C debrox.

## 2014-10-07 NOTE — Assessment & Plan Note (Signed)
BP Readings from Last 3 Encounters:  10/05/14 172/92  09/08/14 171/113  08/27/14 180/102    Lab Results  Component Value Date   NA 135 09/08/2014   K 3.8 09/08/2014   CREATININE 0.75 09/08/2014    Assessment: Blood pressure control:  uncontrolled Progress toward BP goal:   mildly improved Comments: diastolic pressure improved  Plan: Medications:  Lisinopril HCTZ 20-25 daily,add amlodipine 5mg  daily Educational resources provided:   Self management tools provided:   Other plans: Unlikely to get to goal with increase of lisinopril to 40mg  so will add amlodipine 5mg .

## 2014-10-25 ENCOUNTER — Telehealth: Payer: Self-pay | Admitting: Internal Medicine

## 2014-10-25 NOTE — Telephone Encounter (Signed)
Call to patient to confirm appointment for 10/26/14 at 3:45. Patient Confirmed. °

## 2014-10-26 ENCOUNTER — Encounter: Payer: Self-pay | Admitting: Internal Medicine

## 2014-10-26 ENCOUNTER — Ambulatory Visit (INDEPENDENT_AMBULATORY_CARE_PROVIDER_SITE_OTHER): Payer: Self-pay | Admitting: Internal Medicine

## 2014-10-26 VITALS — BP 147/89 | HR 98 | Temp 98.3°F

## 2014-10-26 DIAGNOSIS — Z993 Dependence on wheelchair: Secondary | ICD-10-CM

## 2014-10-26 DIAGNOSIS — G8220 Paraplegia, unspecified: Secondary | ICD-10-CM

## 2014-10-26 DIAGNOSIS — H9192 Unspecified hearing loss, left ear: Secondary | ICD-10-CM

## 2014-10-26 DIAGNOSIS — G629 Polyneuropathy, unspecified: Secondary | ICD-10-CM

## 2014-10-26 DIAGNOSIS — I10 Essential (primary) hypertension: Secondary | ICD-10-CM

## 2014-10-26 DIAGNOSIS — M792 Neuralgia and neuritis, unspecified: Secondary | ICD-10-CM

## 2014-10-26 MED ORDER — LISINOPRIL-HYDROCHLOROTHIAZIDE 20-25 MG PO TABS: 1.0000 | ORAL_TABLET | Freq: Every day | ORAL | Status: DC

## 2014-10-26 MED ORDER — CYCLOBENZAPRINE HCL 10 MG PO TABS: 10.0000 mg | ORAL_TABLET | Freq: Three times a day (TID) | ORAL | Status: DC | PRN

## 2014-10-26 NOTE — Patient Instructions (Signed)
Thank you for bringing your medicines today. This helps [place] keep you safe from mistakes.  We will put in a referral for audiology to have your hearing evaluated as well.

## 2014-10-27 MED ORDER — CYCLOBENZAPRINE HCL 10 MG PO TABS: 10.0000 mg | ORAL_TABLET | Freq: Three times a day (TID) | ORAL | Status: DC | PRN

## 2014-10-27 MED ORDER — NAPROXEN 500 MG PO TABS: 500.0000 mg | ORAL_TABLET | Freq: Two times a day (BID) | ORAL | Status: DC

## 2014-10-29 NOTE — Assessment & Plan Note (Signed)
Refill Flexeril and naproxen today

## 2014-10-29 NOTE — Assessment & Plan Note (Addendum)
BP Readings from Last 3 Encounters:  10/26/14 147/89  10/05/14 172/92  09/08/14 171/113    Lab Results  Component Value Date   NA 135 09/08/2014   K 3.8 09/08/2014   CREATININE 0.75 09/08/2014    Assessment: Blood pressure control: mildly elevated Progress toward BP goal:  at goal Comments:  -Blood pressure improved from prior  Plan: Medications:  Amlodipine 5 mg, lisinopril HCTZ 20/25 milligrams Educational resources provided:   Self management tools provided:   Other plans: Continue current regimen

## 2014-10-29 NOTE — Assessment & Plan Note (Addendum)
-  Does not appear improved from last visit  -ENT referral pending review of his orange card which he is doing today -Will refer to audiology for further evaluation
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
## 2014-11-01 ENCOUNTER — Telehealth: Payer: Self-pay | Admitting: Internal Medicine

## 2014-11-01 NOTE — Telephone Encounter (Signed)
Call to patient to confirm appointment for 11/02/14 at 3:00. Unable to leave message

## 2014-11-02 ENCOUNTER — Ambulatory Visit: Payer: Self-pay

## 2014-11-02 NOTE — Progress Notes (Signed)
Internal Medicine Clinic Attending  Case discussed with Dr. Patel at the time of the visit.  We reviewed the resident's history and exam and pertinent patient test results.  I agree with the assessment, diagnosis, and plan of care documented in the resident's note.  

## 2014-11-22 ENCOUNTER — Telehealth: Payer: Self-pay | Admitting: *Deleted

## 2014-11-22 DIAGNOSIS — F329 Major depressive disorder, single episode, unspecified: Secondary | ICD-10-CM

## 2014-11-22 DIAGNOSIS — F32A Depression, unspecified: Secondary | ICD-10-CM

## 2014-11-22 NOTE — Telephone Encounter (Signed)
MAP at [facility] requesting refill Prozac 40mg  #30 - last refill 10/25/14.  Stanton Kidney Tyger Oka RN 11/22/14 4PM

## 2014-11-24 MED ORDER — FLUOXETINE HCL 40 MG PO CAPS: 40.0000 mg | ORAL_CAPSULE | Freq: Every day | ORAL | Status: DC

## 2014-11-24 NOTE — Telephone Encounter (Signed)
I will go ahead and put in for 90 tablets and 3 refills. Please go ahead and call this medication in. Thank you.

## 2014-11-24 NOTE — Telephone Encounter (Signed)
Rx called in to pharmacy. 

## 2014-12-04 ENCOUNTER — Other Ambulatory Visit: Payer: Self-pay | Admitting: Internal Medicine

## 2014-12-07 ENCOUNTER — Encounter: Payer: Self-pay | Admitting: Internal Medicine

## 2014-12-07 DIAGNOSIS — H60393 Other infective otitis externa, bilateral: Secondary | ICD-10-CM

## 2014-12-07 NOTE — Progress Notes (Signed)
Patient ID: Cameron Zavala, male   DOB: 11-26-1975, 39 y.o.   MRN: [id number]  I have reviewed the office notes from Dr. 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

## 2014-12-07 NOTE — Telephone Encounter (Signed)
As this patient was seen by me on 10/26/14 and deemed necessary for their treatment as documented in the EHR, I will refill this prescription for amitryptiline

## 2014-12-15 ENCOUNTER — Ambulatory Visit: Payer: No Typology Code available for payment source | Admitting: Audiology

## 2015-03-09 ENCOUNTER — Telehealth: Payer: Self-pay | Admitting: Licensed Clinical Social Worker

## 2015-03-09 NOTE — Telephone Encounter (Signed)
Cameron Zavala was referred to CSW as PCP had questions regarding pt's need for SCAT and inability to utilize the fixed route system.  CSW attempted to contact pt, phone number did not have a dial tone.  Pt's secondary contact did not have an alternate number for Cameron Zavala.

## 2015-03-10 NOTE — Telephone Encounter (Signed)
CSW placed call to obtain add'l information for SCAT application.  Pt's spouse answered the phone, same number as patient's.  Spouse states pt is unable to use the fixed route system because: 1. Places they go to often do not have a drop off that is w/c convenient or accessible location 2. Pt would need to cross a busy street for some fixed route stops 3. Sometimes pt has to use manual wheelchair.  It is difficulty to push manual w/c up steep inclines and difficulty for pt to maneuver manual w/c up steep inclines 4. When the fixed route stop is not close, he must use the electric w/c to go to/from stop and this runs his battery out quickly.  Spouse notified the above has been forwarded to PCP for completion of SCAT application.

## 2015-03-24 ENCOUNTER — Encounter: Payer: Self-pay | Admitting: Licensed Clinical Social Worker

## 2015-04-22 ENCOUNTER — Telehealth: Payer: Self-pay | Admitting: Licensed Clinical Social Worker

## 2015-04-22 NOTE — Telephone Encounter (Signed)
CSW placed call to Mr. Meneely to follow up on any concerns family/pt had regarding their Part B of SCAT application.  Spouse states SCAT informed them they have PART A and are in need of PART B.  CSW notified spouse, Melven Sartorius, transit specialist confirmed on 04/21/15 that she has pt's PART B portion.  CSW provided spouse with Ms. Scotty Court direct contact number.  Spouse had spoke with another SCAT worker, spouse will contact Sherria directly.

## 2015-07-19 ENCOUNTER — Ambulatory Visit: Payer: Self-pay

## 2015-08-04 ENCOUNTER — Ambulatory Visit (INDEPENDENT_AMBULATORY_CARE_PROVIDER_SITE_OTHER): Payer: Self-pay | Admitting: *Deleted

## 2015-08-04 DIAGNOSIS — Z23 Encounter for immunization: Secondary | ICD-10-CM

## 2015-08-25 ENCOUNTER — Ambulatory Visit (INDEPENDENT_AMBULATORY_CARE_PROVIDER_SITE_OTHER): Payer: Self-pay | Admitting: Internal Medicine

## 2015-08-25 ENCOUNTER — Encounter: Payer: Self-pay | Admitting: Internal Medicine

## 2015-08-25 VITALS — BP 172/98 | HR 66 | Temp 98.3°F

## 2015-08-25 DIAGNOSIS — R109 Unspecified abdominal pain: Secondary | ICD-10-CM

## 2015-08-25 DIAGNOSIS — G4700 Insomnia, unspecified: Secondary | ICD-10-CM

## 2015-08-25 DIAGNOSIS — Z91018 Allergy to other foods: Secondary | ICD-10-CM

## 2015-08-25 DIAGNOSIS — L8990 Pressure ulcer of unspecified site, unspecified stage: Secondary | ICD-10-CM

## 2015-08-25 DIAGNOSIS — I10 Essential (primary) hypertension: Secondary | ICD-10-CM

## 2015-08-25 DIAGNOSIS — R1011 Right upper quadrant pain: Secondary | ICD-10-CM

## 2015-08-25 DIAGNOSIS — G8929 Other chronic pain: Secondary | ICD-10-CM

## 2015-08-25 MED ORDER — LISINOPRIL-HYDROCHLOROTHIAZIDE 20-25 MG PO TABS: 1.0000 | ORAL_TABLET | Freq: Every day | ORAL | Status: DC

## 2015-08-25 MED ORDER — ULTEC HYDROCOLLOID DRESSING EX PADS
MEDICATED_PAD | CUTANEOUS | Status: DC
Start: 2015-08-25 — End: 2016-01-13

## 2015-08-25 MED ORDER — DIPHENHYDRAMINE HCL 25 MG PO CAPS: 25.0000 mg | ORAL_CAPSULE | ORAL | Status: DC | PRN

## 2015-08-25 MED ORDER — AMLODIPINE BESYLATE 5 MG PO TABS
5.0000 mg | ORAL_TABLET | Freq: Every day | ORAL | Status: DC
Start: 2015-08-25 — End: 2015-09-30

## 2015-08-25 NOTE — Assessment & Plan Note (Signed)
Overview Per his wife, he has been noted to have some bloody discharge for the last 2 months from one of his ulcers. They have applied hydrocolloid dressings as barrier protection and are interested in receiving more. They deny any fever, purulent drainage, increasing size.  Assessment Stable sacral decubitus ulcers with findings as noted above in the physical exam  Plan Refer for home health nursing for additional wound care as wife is legally blind in one eye and primary caregiver for the patient

## 2015-08-25 NOTE — Assessment & Plan Note (Signed)
Overview He does not report any change in pain with his amitriptyline dosing. Per his wife, he was once tried on maximum dose gabapentin without much improvement in his symptoms.  Assessment Poorly-controlled right flank pain. I no longer think that it is neuropathic in nature given the failure to respond to both gabapentin and amitriptyline. In the exclusion of other causes, I'm more inclined to think it is psychosomatic.  Plan Stop amitriptyline today and continue following.

## 2015-08-25 NOTE — Assessment & Plan Note (Addendum)
BP Readings from Last 3 Encounters:  08/25/15 172/98  10/26/14 147/89  10/05/14 172/92    Lab Results  Component Value Date   NA 135 09/08/2014   K 3.8 09/08/2014   CREATININE 0.75 09/08/2014    Assessment: Blood pressure control: moderately elevated Progress toward BP goal:  deteriorated Comments: He brings with him today his lisinopril HCTZ which was last filled back in April. There are no bottles of amlodipine with him. Per his wife, he stopped taking all his medications after noticing outbursts of anger. Also, his mother-in-law passed away back in 01-11-2023 after which his wife has been unable to pick up some the medications from the health department.  Plan: Medications:  lisinopril/HCTZ 20/25 mg, amlodipine 5 mg Educational resources provided:   Self management tools provided:   Other plans: Emphasized to the patient and his wife that he needs to continue taking these medications as anger issues and mood disturbances are highly unlikely to be related to either of these agents. -Also explained that in the absence of taking these agents, he runs the real risk for other medical complications like heart attack or stroke which would give them reasons to be angry and upset -Plan to follow-up in one month for blood pressure reassessment  ADDENDUM 08/29/2015  4:36 PM:  Creatinine below the reference range of normal, likely from decreased muscle mass from deconditioning. Other electrolytes otherwise reassuring.

## 2015-08-25 NOTE — Assessment & Plan Note (Addendum)
Overview He reports having rash following the consumption of bread along with a hotdog. Rash has been ongoing for the last month and is associated with itching. He denies any tongue swelling, throat swelling, difficulty breathing. He denies a family history of similar symptoms.  Assessment Suspect urticarial reaction to food products. Per review of the chart following the appointment, it appears he had a similar reaction 2 years ago.  Plan -Advised patient and his wife to keep an accurate log of symptoms to better identify and characterize a trigger to which they acknowledge understanding. -Prescribed Benadryl 25 mg as needed
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
## 2015-08-25 NOTE — Assessment & Plan Note (Signed)
Overview Since March, he has not been taking Prozac. She reports that he does not suffer from depression that he has been on Prozac as a result of insomnia. She questions why he has never been started on sleeping medication. He denies any changes to his appetite, energy, concentration, mood.  Assessment Poorly controlled insomnia that has not been controlled with SSRI therapy. Per review of the chart, it appears sleep medications were deferred out of interest for cost  Plan Discontinue Prozac today and will reassess at follow-up visit.

## 2015-08-25 NOTE — Patient Instructions (Signed)
Thank you for bringing your medicines today. This helps [place] keep you safe from mistakes.   #1: Please take your blood pressure medications which are lisinopril/HCTZ and amlodipine. Both do not affect mood and are important for protecting her from other diseases, like heart attack or stroke.  #2: I have written for hydrocolloid bandages. Please show the picture at the pharmacist at [facility] so they know which ones to get for you.  #3: Please keep track of which foods or eating to figure out what is causing her skin to break out. If she ever develop shortness of breath, tongue swelling, throat swelling, please call 911 immediately.  We will see you back in one month to recheck your blood pressure.   Progress Toward Treatment Goals:  Treatment Goal 10/26/2014  Blood pressure at goal    Self Care Goals & Plans:  Self Care Goal 07/09/2014  Manage my medications take my medicines as prescribed; bring my medications to every visit; refill my medications on time  Monitor my health keep track of my blood pressure  Eat healthy foods -  Be physically active -  Meeting treatment goals maintain the current self-care plan    No flowsheet data found.   Care Management & Community Referrals:  Referral 10/26/2014  Referrals made for care management support none needed

## 2015-08-26 LAB — BMP8+ANION GAP
Anion Gap: 16 mmol/L (ref 10.0–18.0)
BUN / CREAT RATIO: 24 — AB (ref 8–19)
BUN: 12 mg/dL (ref 6–20)
CO2: 24 mmol/L (ref 18–29)
CREATININE: 0.5 mg/dL — AB (ref 0.76–1.27)
Calcium: 8.9 mg/dL (ref 8.7–10.2)
Chloride: 101 mmol/L (ref 97–106)
GFR calc Af Amer: 158 mL/min/{1.73_m2} (ref >59)
GFR calc non Af Amer: 137 mL/min/{1.73_m2} (ref >59)
Glucose: 90 mg/dL (ref 65–99)
Potassium: 3.7 mmol/L (ref 3.5–5.2)
Sodium: 141 mmol/L (ref 136–144)

## 2015-08-26 NOTE — Addendum Note (Signed)
Addended by: Beather Arbour on: 08/26/2015 01:30 PM   Modules accepted: Orders

## 2015-08-26 NOTE — Progress Notes (Signed)
Internal Medicine Clinic Attending  Case discussed with Dr. Patel,Rushil at the time of the visit.  We reviewed the resident's history and exam and pertinent patient test results.  I agree with the assessment, diagnosis, and plan of care documented in the resident's note.  

## 2015-08-26 NOTE — Addendum Note (Signed)
Addended by: Erlinda Hong T on: 08/26/2015 09:58 AM   Modules accepted: Level of Service

## 2015-09-01 NOTE — Progress Notes (Signed)
Talked with wife - aware Lela is working on referral to [employer]. Aware of Dr Allena Katz note about [facility] will cover Wound [facility].

## 2015-09-30 ENCOUNTER — Ambulatory Visit (INDEPENDENT_AMBULATORY_CARE_PROVIDER_SITE_OTHER): Payer: Self-pay | Admitting: Internal Medicine

## 2015-09-30 ENCOUNTER — Encounter: Payer: Self-pay | Admitting: Internal Medicine

## 2015-09-30 VITALS — BP 152/98 | HR 78 | Temp 98.8°F

## 2015-09-30 DIAGNOSIS — Z13820 Encounter for screening for osteoporosis: Secondary | ICD-10-CM

## 2015-09-30 DIAGNOSIS — M8580 Other specified disorders of bone density and structure, unspecified site: Secondary | ICD-10-CM

## 2015-09-30 DIAGNOSIS — I10 Essential (primary) hypertension: Secondary | ICD-10-CM

## 2015-09-30 MED ORDER — LISINOPRIL-HYDROCHLOROTHIAZIDE 20-25 MG PO TABS: 1.0000 | ORAL_TABLET | Freq: Every day | ORAL | Status: DC

## 2015-09-30 MED ORDER — AMLODIPINE BESYLATE 5 MG PO TABS: 5.0000 mg | ORAL_TABLET | Freq: Every day | ORAL | Status: DC

## 2015-09-30 NOTE — Patient Instructions (Addendum)
Please start taking lisinopril/HCTZ as soon as he pick it up from [facility] your blood pressure.  For your bones, we will schedule a DEXA scan and will check vitamin D. I will prescribe a supplement accordingly.  Please see me back in one month.
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
## 2015-10-01 LAB — VITAMIN D 25 HYDROXY (VIT D DEFICIENCY, FRACTURES): VIT D 25 HYDROXY: 16.9 ng/mL — AB (ref 30.0–100.0)

## 2015-10-03 MED ORDER — CALCIUM-VITAMIN D 500-400 MG-UNIT PO TABS: 2.0000 | ORAL_TABLET | Freq: Every day | ORAL | Status: DC

## 2015-10-03 MED ORDER — VITAMIN D 1000 UNITS PO TABS: 1000.0000 [IU] | ORAL_TABLET | Freq: Every day | ORAL | Status: DC

## 2015-10-03 NOTE — Progress Notes (Signed)
Internal Medicine Clinic Attending  Case discussed with Dr. Patel,Rushil at the time of the visit.  We reviewed the resident's history and exam and pertinent patient test results.  I agree with the assessment, diagnosis, and plan of care documented in the resident's note.  

## 2015-10-03 NOTE — Assessment & Plan Note (Addendum)
Overview His wife reports he was taking calcium and vitamin D supplementation. Vitamin D has never been checked in our office, and DEXA scan has not been treated since 2012 given that he did have a left femoral neck T score of -1.4. He denies any prior history of fractures, excessive alcohol intake, chronic steroid use.  Assessment At risk for osteoporosis and given immobility prior borderline osteopenia as noted on DEXA imaging.  Plan Check vitamin D today and supplement accordingly Reorder DEXA scan  Reassess vitamin D in 3 months  ADDENDUM 10/03/2015  11:20 AM:  Vitamin D 17 which is consistent with deficiency. Plan to supplement with [phone] units daily.   ADDENDUM 10/03/2015  3:57 PM:  Spoke with wife and conveyed information.

## 2015-10-03 NOTE — Assessment & Plan Note (Signed)
Overview Blood pressure today is 152/98, improved from 172/98 at our last visit. He reports taking amlodipine 5 mg. Per review of the chart, he was supposed to start taking lisinopril/HCTZ 20/25 mg daily as well is per his prior regimen, but due to miscommunication, he was not able to pick up this medication. Since his last visit, he denies any headache, blurry vision, chest pain, shortness of breath, abdominal pain.  Assessment Essential hypertension, improved last visit though not at goal.  Plan Prescribed lisinopril/HCTZ 20/25 mg and advised to continue taking amlodipine 5 mg. Confirmed pharmacy to ensure that he is able to pick this medication. Plan to reassess in one month.

## 2015-10-04 ENCOUNTER — Encounter (HOSPITAL_BASED_OUTPATIENT_CLINIC_OR_DEPARTMENT_OTHER): Payer: Self-pay | Attending: General Surgery

## 2015-11-04 ENCOUNTER — Ambulatory Visit (INDEPENDENT_AMBULATORY_CARE_PROVIDER_SITE_OTHER): Payer: Self-pay | Admitting: Internal Medicine

## 2015-11-04 ENCOUNTER — Other Ambulatory Visit: Payer: Self-pay

## 2015-11-04 ENCOUNTER — Encounter: Payer: Self-pay | Admitting: Internal Medicine

## 2015-11-04 VITALS — BP 145/76 | HR 80 | Temp 98.3°F

## 2015-11-04 DIAGNOSIS — F39 Unspecified mood [affective] disorder: Secondary | ICD-10-CM

## 2015-11-04 DIAGNOSIS — M8580 Other specified disorders of bone density and structure, unspecified site: Secondary | ICD-10-CM

## 2015-11-04 DIAGNOSIS — I10 Essential (primary) hypertension: Secondary | ICD-10-CM

## 2015-11-04 DIAGNOSIS — M62838 Other muscle spasm: Secondary | ICD-10-CM

## 2015-11-04 MED ORDER — CYCLOBENZAPRINE HCL 10 MG PO TABS: 10.0000 mg | ORAL_TABLET | Freq: Three times a day (TID) | ORAL | Status: DC | PRN

## 2015-11-04 MED ORDER — CITALOPRAM HYDROBROMIDE 20 MG PO TABS: 20.0000 mg | ORAL_TABLET | Freq: Every day | ORAL | Status: DC
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
## 2015-11-04 NOTE — Patient Instructions (Addendum)
Please start taking Celexa 20 mg daily along with lisinopril/HCTZ for your blood pressure. It's important you take Celexa everyday for the next 6 weeks to build up in your system to see how you're doing on this medication.   Please come back in six weeks to see how you're doing.

## 2015-11-06 NOTE — Assessment & Plan Note (Signed)
Overview His wife is requesting Flexeril 10 mg 3 times daily as needed for muscle spasms in his legs as needed.  Assessment Muscle spasticity in the setting of hemiplegia.  Plan Prescribe Flexeril 10 mg 3 times daily as needed for muscle spasms

## 2015-11-06 NOTE — Assessment & Plan Note (Signed)
Overview He reports adherence to vitamin D and calcium supplementation as he had been instructed at his prior visits. DEXA scan has not been scheduled since the interval following his last office visit for unclear reasons.  Assessment History of osteopenia on DEXA scan.  Plan Reschedule DEXA scan to reassess his bone density

## 2015-11-06 NOTE — Assessment & Plan Note (Signed)
Overview His blood pressure today is 145/76 though his wife acknowledges he took his blood pressure medication today because he knew he would be asked about it. He has lisinopril/HCTZ 20/25 mg as well as amlodipine 5 mg at home. She reports she still struggles with "mood issues" when he takes his medication. He becomes more self-conscious and easily irritable. He denies any symptoms of hypertensive urgency like changes in vision, chest pain, shortness of breath, nausea, vomiting, abdominal pain. He used to take Prozac though his wife reports it was for sleep as he cannot afford proper sleep medication.  Assessment Essential hypertension, slightly improved with pharmacologic measures though poorly controlled given information noted above. No psychiatric side effects known as a consequence of the antihypertensives he has been on. Suspect he struggles with adherence to multiple medications as well and will try to simplify the regimen for him.  Plan -Educated the patient about the dangers of poorly controlled hypertension twitching all to understanding -Discontinued amlodipine 5 mg with a plan to discontinue lisinopril/HCTZ 20/25 mg daily with his agreement -Started Celexa 20 mg daily to cover for mood issues with plan to follow-up in 4-6 weeks for re-titration if necessary

## 2015-11-06 NOTE — Assessment & Plan Note (Addendum)
Overview He struggles with taking his antihypertensives as he becomes self-conscious and irritable per his wife. No clear association with these symptoms prior to December. He used to be on Prozac for sleep out of interest for cost as noted on previous notes. PHQ 9 score unremarkable. He denies any changes in appetite, difficulty sleeping, depressed mood, poor energy.  Assessment Unclear mood disorder associated with his antihypertensives. Not sure if this is just related to difficulties with medication adherence.  Plan Started Celexa 20 mg given interest of cost as it's on the $4 formulary at [facility] with plan to follow-up in 4-6 weeks for reassessment

## 2015-11-07 NOTE — Progress Notes (Signed)
Internal Medicine Clinic Attending  Case discussed with Dr. Patel,Rushil at the time of the visit.  We reviewed the resident's history and exam and pertinent patient test results.  I agree with the assessment, diagnosis, and plan of care documented in the resident's note.  

## 2015-11-23 NOTE — Addendum Note (Signed)
Addended by: Neomia Dear on: 11/23/2015 07:53 PM   Modules accepted: Orders

## 2015-11-29 ENCOUNTER — Encounter (HOSPITAL_BASED_OUTPATIENT_CLINIC_OR_DEPARTMENT_OTHER): Payer: Medicaid Other | Attending: Surgery

## 2015-11-29 DIAGNOSIS — G8220 Paraplegia, unspecified: Secondary | ICD-10-CM | POA: Diagnosis not present

## 2015-11-29 DIAGNOSIS — L89310 Pressure ulcer of right buttock, unstageable: Secondary | ICD-10-CM | POA: Insufficient documentation

## 2015-11-29 DIAGNOSIS — I10 Essential (primary) hypertension: Secondary | ICD-10-CM | POA: Diagnosis not present

## 2015-11-29 DIAGNOSIS — F17210 Nicotine dependence, cigarettes, uncomplicated: Secondary | ICD-10-CM | POA: Insufficient documentation

## 2015-12-02 ENCOUNTER — Ambulatory Visit: Payer: Self-pay | Admitting: Internal Medicine

## 2015-12-06 ENCOUNTER — Telehealth: Payer: Self-pay | Admitting: Internal Medicine

## 2015-12-06 DIAGNOSIS — L89310 Pressure ulcer of right buttock, unstageable: Secondary | ICD-10-CM | POA: Diagnosis not present

## 2015-12-06 NOTE — Telephone Encounter (Signed)
APPT. REMINDER CALL, LMTCB °

## 2015-12-07 ENCOUNTER — Observation Stay (HOSPITAL_COMMUNITY)
Admission: AD | Admit: 2015-12-07 | Discharge: 2015-12-10 | Disposition: A | Discharge status: Home / Self Care | Payer: Medicaid Other | Source: Ambulatory Visit | Attending: Student in an Organized Health Care Education/Training Program | Admitting: Student in an Organized Health Care Education/Training Program

## 2015-12-07 ENCOUNTER — Encounter: Payer: Self-pay | Admitting: Internal Medicine

## 2015-12-07 ENCOUNTER — Ambulatory Visit (INDEPENDENT_AMBULATORY_CARE_PROVIDER_SITE_OTHER): Payer: No Typology Code available for payment source | Admitting: Internal Medicine

## 2015-12-07 VITALS — BP 152/79 | HR 87 | Temp 98.2°F | Resp 18

## 2015-12-07 DIAGNOSIS — G8220 Paraplegia, unspecified: Secondary | ICD-10-CM | POA: Diagnosis not present

## 2015-12-07 DIAGNOSIS — F32A Depression, unspecified: Secondary | ICD-10-CM | POA: Diagnosis present

## 2015-12-07 DIAGNOSIS — I10 Essential (primary) hypertension: Secondary | ICD-10-CM | POA: Diagnosis present

## 2015-12-07 DIAGNOSIS — R7303 Prediabetes: Secondary | ICD-10-CM | POA: Diagnosis present

## 2015-12-07 DIAGNOSIS — M62838 Other muscle spasm: Secondary | ICD-10-CM | POA: Diagnosis present

## 2015-12-07 DIAGNOSIS — H0019 Chalazion unspecified eye, unspecified eyelid: Secondary | ICD-10-CM | POA: Insufficient documentation

## 2015-12-07 DIAGNOSIS — Z597 Insufficient social insurance and welfare support: Secondary | ICD-10-CM

## 2015-12-07 DIAGNOSIS — R509 Fever, unspecified: Secondary | ICD-10-CM | POA: Diagnosis not present

## 2015-12-07 DIAGNOSIS — E46 Unspecified protein-calorie malnutrition: Secondary | ICD-10-CM

## 2015-12-07 DIAGNOSIS — Z8619 Personal history of other infectious and parasitic diseases: Secondary | ICD-10-CM | POA: Diagnosis not present

## 2015-12-07 DIAGNOSIS — F329 Major depressive disorder, single episode, unspecified: Secondary | ICD-10-CM | POA: Diagnosis not present

## 2015-12-07 DIAGNOSIS — Z872 Personal history of diseases of the skin and subcutaneous tissue: Secondary | ICD-10-CM | POA: Insufficient documentation

## 2015-12-07 DIAGNOSIS — K5900 Constipation, unspecified: Secondary | ICD-10-CM | POA: Diagnosis not present

## 2015-12-07 DIAGNOSIS — L89310 Pressure ulcer of right buttock, unstageable: Secondary | ICD-10-CM

## 2015-12-07 DIAGNOSIS — R05 Cough: Secondary | ICD-10-CM | POA: Insufficient documentation

## 2015-12-07 DIAGNOSIS — L8990 Pressure ulcer of unspecified site, unspecified stage: Secondary | ICD-10-CM

## 2015-12-07 DIAGNOSIS — F17200 Nicotine dependence, unspecified, uncomplicated: Secondary | ICD-10-CM | POA: Diagnosis not present

## 2015-12-07 DIAGNOSIS — E785 Hyperlipidemia, unspecified: Secondary | ICD-10-CM | POA: Diagnosis present

## 2015-12-07 DIAGNOSIS — L89159 Pressure ulcer of sacral region, unspecified stage: Secondary | ICD-10-CM | POA: Diagnosis not present

## 2015-12-07 LAB — MRSA PCR SCREENING: MRSA BY PCR: NEGATIVE

## 2015-12-07 MED ORDER — ACETAMINOPHEN 325 MG PO TABS
650.0000 mg | ORAL_TABLET | Freq: Four times a day (QID) | ORAL | Status: DC | PRN
  Administered 2015-12-07 – 2015-12-08 (×2): 650 mg via ORAL
  Filled 2015-12-07 (×3): qty 2

## 2015-12-07 MED ORDER — DOCUSATE SODIUM 100 MG PO CAPS
100.0000 mg | ORAL_CAPSULE | Freq: Two times a day (BID) | ORAL | Status: DC
  Administered 2015-12-07 – 2015-12-10 (×6): 100 mg via ORAL
  Filled 2015-12-07 (×6): qty 1

## 2015-12-07 MED ORDER — ACETAMINOPHEN 650 MG RE SUPP: 650.0000 mg | Freq: Four times a day (QID) | RECTAL | Status: DC | PRN

## 2015-12-07 MED ORDER — LISINOPRIL 20 MG PO TABS
20.0000 mg | ORAL_TABLET | Freq: Every day | ORAL | Status: DC
Start: 2015-12-08 — End: 2015-12-10
  Administered 2015-12-08 – 2015-12-10 (×3): 20 mg via ORAL
  Filled 2015-12-07 (×3): qty 1

## 2015-12-07 MED ORDER — LISINOPRIL-HYDROCHLOROTHIAZIDE 20-12.5 MG PO TABS: 2.0000 | ORAL_TABLET | Freq: Every day | ORAL | Status: DC

## 2015-12-07 MED ORDER — LISINOPRIL-HYDROCHLOROTHIAZIDE 20-25 MG PO TABS: 2.0000 | ORAL_TABLET | Freq: Every day | ORAL | Status: DC

## 2015-12-07 MED ORDER — CITALOPRAM HYDROBROMIDE 20 MG PO TABS
20.0000 mg | ORAL_TABLET | Freq: Every day | ORAL | Status: DC
  Administered 2015-12-08 – 2015-12-10 (×3): 20 mg via ORAL
  Filled 2015-12-07 (×2): qty 1

## 2015-12-07 MED ORDER — SODIUM CHLORIDE 0.9% FLUSH
3.0000 mL | Freq: Two times a day (BID) | INTRAVENOUS | Status: DC
  Administered 2015-12-07 – 2015-12-10 (×6): 3 mL via INTRAVENOUS

## 2015-12-07 MED ORDER — ENOXAPARIN SODIUM 40 MG/0.4ML ~~LOC~~ SOLN
40.0000 mg | SUBCUTANEOUS | Status: DC
  Administered 2015-12-07 – 2015-12-09 (×3): 40 mg via SUBCUTANEOUS
  Filled 2015-12-07 (×2): qty 0.4

## 2015-12-07 MED ORDER — VITAMIN D 1000 UNITS PO TABS
1000.0000 [IU] | ORAL_TABLET | Freq: Every day | ORAL | Status: DC
  Administered 2015-12-08 – 2015-12-10 (×3): 1000 [IU] via ORAL
  Filled 2015-12-07 (×3): qty 1

## 2015-12-07 MED ORDER — CYCLOBENZAPRINE HCL 10 MG PO TABS
10.0000 mg | ORAL_TABLET | Freq: Three times a day (TID) | ORAL | Status: DC | PRN
  Administered 2015-12-09: 10 mg via ORAL
  Filled 2015-12-07: qty 1

## 2015-12-07 MED ORDER — SENNOSIDES-DOCUSATE SODIUM 8.6-50 MG PO TABS
1.0000 | ORAL_TABLET | Freq: Every evening | ORAL | Status: DC | PRN
  Administered 2015-12-08: 1 via ORAL
  Filled 2015-12-07: qty 1

## 2015-12-07 MED ORDER — HYDROCHLOROTHIAZIDE 12.5 MG PO CAPS
12.5000 mg | ORAL_CAPSULE | Freq: Every day | ORAL | Status: DC
  Administered 2015-12-08 – 2015-12-10 (×3): 12.5 mg via ORAL
  Filled 2015-12-07 (×3): qty 1
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
## 2015-12-07 NOTE — Assessment & Plan Note (Signed)
Filed Vitals:   12/07/15 1458  BP: 152/79  Pulse: 87  Temp: 98.2 F (36.8 C)  Resp: 18   BP remains uncontrolled. Not taking amlodipine 5mg  anymore. His mood is now better on celexa. I think amlodipine is not the cause for the mood change, he is probably just feeling better being on celexa. However, since he had complaint of mood problem with amlodipine I will avoid it.  Will increase lisinopril-hctz to 40-25 (2 pills of 20-12.5mg ) daily.  Need to check a BMET as we are going up on [facility], however, since he will be admitted, we will just have inpatient team check the labs with any other blood work they may consider doing.
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
## 2015-12-07 NOTE — Progress Notes (Signed)
T 101.3. Tylenol given. MD on call made aware. No order.
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
## 2015-12-07 NOTE — Progress Notes (Signed)
New Admission Note: direct admit  Arrival Method: motorized wheelchair Mental Orientation:  Telemetry: will place Assessment: Completed Skin: clean dry intact. R hip decubitus ulcer IV: none Pain: none Tubes: none Safety Measures: Safety Fall Prevention Plan has been given, discussed and signed Admission: Completed Unit Orientation: Patient has been orientated to the room, unit and staff.  Family: wife present  Orders have been reviewed and implemented. Will continue to monitor the patient. Call light has been placed within reach and bed alarm has been activated.   Janeann Forehand BSN, RN

## 2015-12-08 ENCOUNTER — Encounter (HOSPITAL_COMMUNITY): Payer: Self-pay | Admitting: *Deleted

## 2015-12-08 DIAGNOSIS — L89159 Pressure ulcer of sacral region, unspecified stage: Principal | ICD-10-CM

## 2015-12-08 LAB — COMPREHENSIVE METABOLIC PANEL
ALK PHOS: 61 U/L (ref 38–126)
ALT: 22 U/L (ref 17–63)
AST: 20 U/L (ref 15–41)
Albumin: 3.2 g/dL — ABNORMAL LOW (ref 3.5–5.0)
Anion gap: 10 (ref 5–15)
BUN: 7 mg/dL (ref 6–20)
CALCIUM: 8.8 mg/dL — AB (ref 8.9–10.3)
CHLORIDE: 99 mmol/L — AB (ref 101–111)
CO2: 30 mmol/L (ref 22–32)
CREATININE: 0.68 mg/dL (ref 0.61–1.24)
GFR calc Af Amer: >60 mL/min (ref >60)
Glucose, Bld: 109 mg/dL — ABNORMAL HIGH (ref 65–99)
Potassium: 3.7 mmol/L (ref 3.5–5.1)
Sodium: 139 mmol/L (ref 135–145)
Total Bilirubin: 0.9 mg/dL (ref 0.3–1.2)
Total Protein: 7.4 g/dL (ref 6.5–8.1)

## 2015-12-08 LAB — CBC WITH DIFFERENTIAL/PLATELET
Basophils Absolute: 0 10*3/uL (ref 0.0–0.1)
Basophils Relative: 0 %
EOS ABS: 0.1 10*3/uL (ref 0.0–0.7)
EOS PCT: 1 %
HCT: 40.5 % (ref 39.0–52.0)
Hemoglobin: 14.2 g/dL (ref 13.0–17.0)
LYMPHS ABS: 1.5 10*3/uL (ref 0.7–4.0)
Lymphocytes Relative: 20 %
MCH: 30.3 pg (ref 26.0–34.0)
MCHC: 35.1 g/dL (ref 30.0–36.0)
MCV: 86.4 fL (ref 78.0–100.0)
MONOS PCT: 11 %
Monocytes Absolute: 0.8 10*3/uL (ref 0.1–1.0)
Neutro Abs: 5.2 10*3/uL (ref 1.7–7.7)
Neutrophils Relative %: 68 %
PLATELETS: 254 10*3/uL (ref 150–400)
RBC: 4.69 MIL/uL (ref 4.22–5.81)
RDW: 12.9 % (ref 11.5–15.5)
WBC: 7.6 10*3/uL (ref 4.0–10.5)

## 2015-12-08 LAB — LIPID PANEL
CHOL/HDL RATIO: 5 ratio
Cholesterol: 151 mg/dL (ref 0–200)
HDL: 30 mg/dL — ABNORMAL LOW (ref >40)
LDL CALC: 109 mg/dL — AB (ref 0–99)
Triglycerides: 62 mg/dL (ref <150)
VLDL: 12 mg/dL (ref 0–40)

## 2015-12-08 LAB — PHOSPHORUS: Phosphorus: 3.1 mg/dL (ref 2.5–4.6)

## 2015-12-08 LAB — PROTIME-INR
INR: 1.28 (ref 0.00–1.49)
Prothrombin Time: 16.2 seconds — ABNORMAL HIGH (ref 11.6–15.2)

## 2015-12-08 LAB — SEDIMENTATION RATE: SED RATE: 90 mm/h — AB (ref 0–16)

## 2015-12-08 LAB — MAGNESIUM: Magnesium: 2.4 mg/dL (ref 1.7–2.4)

## 2015-12-08 LAB — PREALBUMIN: Prealbumin: 8.4 mg/dL — ABNORMAL LOW (ref 18–38)

## 2015-12-08 LAB — HIV ANTIBODY (ROUTINE TESTING W REFLEX): HIV SCREEN 4TH GENERATION: NONREACTIVE

## 2015-12-08 LAB — APTT: aPTT: 42 seconds — ABNORMAL HIGH (ref 24–37)

## 2015-12-08 MED ORDER — ENSURE ENLIVE PO LIQD
237.0000 mL | Freq: Two times a day (BID) | ORAL | Status: DC
Start: 2015-12-08 — End: 2015-12-10
  Administered 2015-12-08 – 2015-12-10 (×5): 237 mL via ORAL
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
## 2015-12-09 DIAGNOSIS — L89153 Pressure ulcer of sacral region, stage 3: Secondary | ICD-10-CM

## 2015-12-09 DIAGNOSIS — L89159 Pressure ulcer of sacral region, unspecified stage: Secondary | ICD-10-CM | POA: Diagnosis not present

## 2015-12-09 LAB — HCV COMMENT:

## 2015-12-09 LAB — HEPATITIS C ANTIBODY (REFLEX): HCV Ab: <0.1 s/co ratio (ref 0.0–0.9)

## 2015-12-09 LAB — HEMOGLOBIN A1C
Hgb A1c MFr Bld: 5.6 % (ref 4.8–5.6)
MEAN PLASMA GLUCOSE: 114 mg/dL

## 2015-12-09 MED ORDER — CIPROFLOXACIN HCL 500 MG PO TABS
500.0000 mg | ORAL_TABLET | Freq: Two times a day (BID) | ORAL | Status: DC
  Administered 2015-12-09 – 2015-12-10 (×3): 500 mg via ORAL
  Filled 2015-12-09 (×3): qty 1

## 2015-12-09 MED ORDER — DOXYCYCLINE HYCLATE 100 MG PO TABS
100.0000 mg | ORAL_TABLET | Freq: Two times a day (BID) | ORAL | Status: DC
  Administered 2015-12-09 – 2015-12-10 (×3): 100 mg via ORAL
  Filled 2015-12-09 (×3): qty 1

## 2015-12-09 NOTE — Care Management Note (Signed)
Case Management Note  Patient Details  Name: Xzavior Connally MRN: [id number] Date of Birth: 1975/11/21  Subjective/Objective:                 Patient admitted with decubitus to sacrum. He states he lives at home with his wife, no children. He states he has no feeling from his "belly down." He states that he does not have medicaid because he is not a citizen, "just a resident." He states that Dr Allena Katz at [facility] is his PCP and he has an orange card. He states that he uses [facility], and goes to [facility] to get his Rx filled for $4. He has a wheelchair, electric, in room.   Action/Plan:  CM will continue to follow for [facility] needs. May be helpful to have [facility] RN for management of wound id wife unable (she was not available to ask), insurance will be a barrier. Expected Discharge Date:  12/12/15               Expected Discharge Plan:  Home/Self Care  In-House Referral:     Discharge planning Services  CM Consult  Post Acute Care Choice:    Choice offered to:     DME Arranged:    DME Agency:     HH Arranged:    HH Agency:     Status of Service:  In process, will continue to follow  Medicare Important Message Given:    Date Medicare IM Given:    Medicare IM give by:    Date Additional Medicare IM Given:    Additional Medicare Important Message give by:     If discussed at Long Length of Stay Meetings, dates discussed:    Additional Comments:  Lawerance Sabal, RN 12/09/2015, 1:51 PM

## 2015-12-09 NOTE — Progress Notes (Signed)
[place]      [address] [place], [place] [address]    Phone: [phone] FAX: [phone]     Subjective: No concerns. Appetite is great.  Able to roll side to side with ease.  Spouse at bedside.   No fevers.   Objective:  Vital signs:  Filed Vitals:   12/08/15 1341 12/08/15 1632 12/08/15 2219 12/09/15 0933  BP:  150/73 140/74 146/76  Pulse:  83 93 95  Temp:  99.1 F (37.3 C) 99.6 F (37.6 C) 98.6 F (37 C)  TempSrc:  Oral Oral Oral  Resp:  _0 Weight: 58.968 kg (130 lb)     SpO2:  96% 97% 98%    Last BM Date: 12/08/15  Intake/Output   Yesterday:  03/16 0701 - 03/17 0700 In: 600 [P.O.:600] Out: 0  This shift:  Total I/O In: 120 [P.O.:120] Out: -    Physical Exam: General: Pt awake/alert/oriented x4 in no acute distress Skin: right ischium-no bleeding 30% healthy viable tissue remaining wound with necrotic tissue.    Problem List:   Principal Problem:   Sacral decubitus ulcer Active Problems:   Unspecified mood (affective) disorder (HCC)   Essential hypertension   CONSTIPATION, CHRONIC   Lower paraplegia (HCC)   Muscle spasm   Prediabetes   Hyperlipidemia   Vitamin D deficiency    Results:   Labs: Results for orders placed or performed during the hospital encounter of 12/07/15 (from the past 48 hour(s))  MRSA PCR Screening     Status: None   Collection Time: 12/07/15  8:16 PM  Result Value Ref Range   MRSA by PCR NEGATIVE NEGATIVE    Comment:        The GeneXpert MRSA Assay (FDA approved for NASAL specimens only), is one component of a comprehensive MRSA colonization surveillance program. It is not intended to diagnose MRSA infection nor to guide or monitor treatment for MRSA infections.   Comprehensive metabolic panel     Status: Abnormal   Collection Time: 12/08/15  5:34 AM  Result Value Ref Range   Sodium 139 135 - 145 mmol/L   Potassium 3.7 3.5 - 5.1 mmol/L   Chloride 99 (L) 101 - 111 mmol/L   CO2 30 22 - 32 mmol/L   Glucose, Bld 109 (H) 65 - 99 mg/dL   BUN 7 6 - 20 mg/dL   Creatinine, Ser 0.68 0.61 - 1.24 mg/dL   Calcium 8.8 (L) 8.9 - 10.3 mg/dL   Total Protein 7.4 6.5 - 8.1 g/dL   Albumin 3.2 (L) 3.5 - 5.0 g/dL   AST 20 15 - 41 U/L   ALT 22 17 - 63 U/L   Alkaline Phosphatase 61 38 - 126 U/L   Total Bilirubin 0.9 0.3 - 1.2 mg/dL   GFR calc non Af Amer >60 >60 mL/min   GFR calc Af Amer >60 >60 mL/min    Comment: (NOTE) The eGFR has been calculated using the CKD EPI equation. This calculation has not been validated in all clinical situations. eGFR's persistently <60 mL/min signify possible Chronic Kidney Disease.    Anion gap 10 5 - 15  CBC with Differential/Platelet     Status: None   Collection Time: 12/08/15  5:34 AM  Result Value Ref Range   WBC 7.6 4.0 - 10.5 K/uL   RBC 4.69 4.22 - 5.81 MIL/uL   Hemoglobin 14.2 13.0 - 17.0 g/dL   HCT  40.5 39.0 - 52.0 %   MCV 86.4 78.0 - 100.0 fL   MCH 30.3 26.0 - 34.0 pg   MCHC 35.1 30.0 - 36.0 g/dL   RDW 12.9 11.5 - 15.5 %   Platelets 254 150 - 400 K/uL   Neutrophils Relative % 68 %   Neutro Abs 5.2 1.7 - 7.7 K/uL   Lymphocytes Relative 20 %   Lymphs Abs 1.5 0.7 - 4.0 K/uL   Monocytes Relative 11 %   Monocytes Absolute 0.8 0.1 - 1.0 K/uL   Eosinophils Relative 1 %   Eosinophils Absolute 0.1 0.0 - 0.7 K/uL   Basophils Relative 0 %   Basophils Absolute 0.0 0.0 - 0.1 K/uL  Hemoglobin A1c     Status: None   Collection Time: 12/08/15  5:34 AM  Result Value Ref Range   Hgb A1c MFr Bld 5.6 4.8 - 5.6 %    Comment: (NOTE)         Pre-diabetes: 5.7 - 6.4         Diabetes: >6.4         Glycemic control for adults with diabetes: <7.0    Mean Plasma Glucose 114 mg/dL    Comment: (NOTE) Performed At: [facility] [address] [place], [place] [id number] Lindon Romp MD VP:[id number]   HIV antibody     Status: None   Collection Time: 12/08/15  5:34 AM  Result Value Ref Range    HIV Screen 4th Generation wRfx Non Reactive Non Reactive    Comment: (NOTE) Performed At: [facility] [address] [place], [place] [id number] Lindon Romp MD XF:[id number]   Hepatitis c antibody (reflex)     Status: None   Collection Time: 12/08/15  5:34 AM  Result Value Ref Range   HCV Ab <0.1 0.0 - 0.9 s/co ratio    Comment: (NOTE) Performed At: [facility] [place], [place] [id number] Lindon Romp MD FB:[id number]   Magnesium     Status: None   Collection Time: 12/08/15  5:34 AM  Result Value Ref Range   Magnesium 2.4 1.7 - 2.4 mg/dL  Prealbumin     Status: Abnormal   Collection Time: 12/08/15  5:34 AM  Result Value Ref Range   Prealbumin 8.4 (L) 18 - 38 mg/dL  Phosphorus     Status: None   Collection Time: 12/08/15  5:34 AM  Result Value Ref Range   Phosphorus 3.1 2.5 - 4.6 mg/dL  Lipid panel     Status: Abnormal   Collection Time: 12/08/15  5:34 AM  Result Value Ref Range   Cholesterol 151 0 - 200 mg/dL   Triglycerides 62 <150 mg/dL   HDL 30 (L) >40 mg/dL   Total CHOL/HDL Ratio 5.0 RATIO   VLDL 12 0 - 40 mg/dL   LDL Cholesterol 109 (H) 0 - 99 mg/dL    Comment:        Total Cholesterol/HDL:CHD Risk Coronary Heart Disease Risk Table                     Men   Women  1/2 Average Risk   3.4   3.3  Average Risk       5.0   4.4  2 X Average Risk   9.6   7.1  3 X Average Risk  23.4   11.0        Use the calculated Patient Ratio above and the CHD Risk Table to  determine the patient's CHD Risk.        ATP III CLASSIFICATION (LDL):  <100     mg/dL   Optimal  100-129  mg/dL   Near or Above                    Optimal  130-159  mg/dL   Borderline  160-189  mg/dL   High  >190     mg/dL   Very High   Sedimentation rate     Status: Abnormal   Collection Time: 12/08/15  5:34 AM  Result Value Ref Range   Sed Rate 90 (H) 0 - 16 mm/hr  Protime-INR     Status: Abnormal   Collection Time: 12/08/15  5:34 AM  Result Value Ref  Range   Prothrombin Time 16.2 (H) 11.6 - 15.2 seconds   INR 1.28 0.00 - 1.49  APTT     Status: Abnormal   Collection Time: 12/08/15  5:34 AM  Result Value Ref Range   aPTT 42 (H) 24 - 37 seconds    Comment:        IF BASELINE aPTT IS ELEVATED, SUGGEST PATIENT RISK ASSESSMENT BE USED TO DETERMINE APPROPRIATE ANTICOAGULANT THERAPY.   HCV Comment:     Status: None   Collection Time: 12/08/15  5:34 AM  Result Value Ref Range   Comment: Comment     Comment: (NOTE) Non reactive HCV antibody screen is consistent with no HCV infection, unless recent infection is suspected or other evidence exists to indicate HCV infection. Performed At: [facility] [place], [place] [id number] Lindon Romp MD QX:[id number]     Imaging / Studies: No results found.  Medications / Allergies:  Scheduled Meds: . cholecalciferol  1,000 Units Oral Daily  . ciprofloxacin  500 mg Oral BID  . citalopram  20 mg Oral Daily  . docusate sodium  100 mg Oral BID  . doxycycline  100 mg Oral Q12H  . enoxaparin (LOVENOX) injection  40 mg Subcutaneous Q24H  . feeding supplement (ENSURE ENLIVE)  237 mL Oral BID BM  . lisinopril  20 mg Oral Daily   And  . hydrochlorothiazide  12.5 mg Oral Daily  . sodium chloride flush  3 mL Intravenous Q12H   Continuous Infusions:  PRN Meds:.acetaminophen **OR** acetaminophen, cyclobenzaprine, senna-docusate  Antibiotics: Anti-infectives    Start     Dose/Rate Route Frequency Ordered Stop   12/09/15 0945  ciprofloxacin (CIPRO) tablet 500 mg     500 mg Oral 2 times daily 12/09/15 0939     12/09/15 0945  doxycycline (VIBRA-TABS) tablet 100 mg     100 mg Oral Every 12 hours 12/09/15 0349          Assessment/Plan POD#1 debridement of right ischial ulcer, stage 3 -bedside debridement today. -plan to start BID Wet to dry dressing changes tomorrow.  -will need home health.  OP follow up in the wound center.    Erby Pian, [facility] Surgery Pager [phone]) For consults and floor pages call [phone](7A-4:30P)  12/09/2015 11:09 AM

## 2015-12-09 NOTE — Progress Notes (Signed)
Internal Medicine Clinic Attending  Case discussed with Dr. Ahmed at the time of the visit.  We reviewed the resident's history and exam and pertinent patient test results.  I agree with the assessment, diagnosis, and plan of care documented in the resident's note. 
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
## 2015-12-10 DIAGNOSIS — L89159 Pressure ulcer of sacral region, unspecified stage: Secondary | ICD-10-CM | POA: Diagnosis not present

## 2015-12-10 MED ORDER — DOXYCYCLINE HYCLATE 100 MG PO TABS: 100.0000 mg | ORAL_TABLET | Freq: Two times a day (BID) | ORAL | Status: DC

## 2015-12-10 MED ORDER — CIPROFLOXACIN HCL 500 MG PO TABS: 500.0000 mg | ORAL_TABLET | Freq: Two times a day (BID) | ORAL | Status: DC

## 2015-12-10 NOTE — Progress Notes (Signed)
12/10/2015  3:35 PM  Cameron Zavala to be D/C'd Home per MD order.  Discussed prescriptions and follow up appointments with the patient. Prescriptions given to patient, medication list explained in detail. Pt verbalized understanding.    Medication List    STOP taking these medications        calcium gluconate 500 MG tablet      TAKE these medications        cholecalciferol 1000 units tablet  Commonly known as:  VITAMIN D  Take 1 tablet (1,000 Units total) by mouth daily.     ciprofloxacin 500 MG tablet  Commonly known as:  CIPRO  Take 1 tablet (500 mg total) by mouth 2 (two) times daily.     citalopram 20 MG tablet  Commonly known as:  CELEXA  Take 1 tablet (20 mg total) by mouth daily.     cyclobenzaprine 10 MG tablet  Commonly known as:  FLEXERIL  Take 1 tablet (10 mg total) by mouth 3 (three) times daily as needed for muscle spasms.     diphenhydrAMINE 25 mg capsule  Commonly known as:  BENADRYL  Take 1 capsule (25 mg total) by mouth as needed.     docusate sodium 100 MG capsule  Commonly known as:  COLACE  Take 1 capsule (100 mg total) by mouth 2 (two) times daily.     doxycycline 100 MG tablet  Commonly known as:  VIBRA-TABS  Take 1 tablet (100 mg total) by mouth 2 (two) times daily.     lisinopril-hydrochlorothiazide 20-12.5 MG tablet  Commonly known as:  ZESTORETIC  Take 2 tablets by mouth daily.     naproxen 500 MG tablet  Commonly known as:  NAPROSYN  Take 1 tablet (500 mg total) by mouth 2 (two) times daily with a meal.     polyethylene glycol powder powder  Commonly known as:  MIRALAX  Take 17 g by mouth daily.     ULTEC HYDROCOLLOID DRESSING Pads  Apply once daily to ulcers.        Filed Vitals:   12/10/15 0500 12/10/15 0749  BP: 143/84 132/76  Pulse: 89 86  Temp: 98.7 F (37.1 C) 99.1 F (37.3 C)  Resp: 18 17    Skin clean, dry and intact without evidence of skin break down, no evidence of skin tears noted. IV catheter  discontinued intact. Site without signs and symptoms of complications. Dressing and pressure applied. Pt denies pain at this time. No complaints noted.  An After Visit Summary was printed and given to the patient. Patient escorted via motor WC.   [employer], RN-BC, [employer] [facility] [address] Phone [phone]
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
## 2015-12-10 NOTE — Progress Notes (Signed)
  Subjective: No complaints   Objective: Vital signs in last 24 hours: Temp:  [98.4 F (36.9 C)-99.8 F (37.7 C)] 99.1 F (37.3 C) (03/18 0749) Pulse Rate:  [86-95] 86 (03/18 0749) Resp:  [16-18] 17 (03/18 0749) BP: (132-148)/(74-84) 132/76 mmHg (03/18 0749) SpO2:  [98 %-100 %] 100 % (03/18 0749) Weight:  [59.9 kg (132 lb 0.9 oz)] 59.9 kg (132 lb 0.9 oz) (03/18 0500) Last BM Date: 12/09/15  Intake/Output from previous day: 03/17 0701 - 03/18 0700 In: 480 [P.O.:480] Out: -  Intake/Output this shift:    Right ischial wound - packing removed; some oozing, but clean  Lab Results:   Recent Labs  12/08/15 0534  WBC 7.6  HGB 14.2  HCT 40.5  PLT 254   BMET  Recent Labs  12/08/15 0534  NA 139  K 3.7  CL 99*  CO2 30  GLUCOSE 109*  BUN 7  CREATININE 0.68  CALCIUM 8.8*   PT/INR  Recent Labs  12/08/15 0534  LABPROT 16.2*  INR 1.28   ABG No results for input(s): PHART, HCO3 in the last 72 hours.  Invalid input(s): PCO2, PO2  Studies/Results: No results found.  Anti-infectives: Anti-infectives    Start     Dose/Rate Route Frequency Ordered Stop   12/09/15 0945  ciprofloxacin (CIPRO) tablet 500 mg     500 mg Oral 2 times daily 12/09/15 0939     12/09/15 0945  doxycycline (VIBRA-TABS) tablet 100 mg     100 mg Oral Every 12 hours 12/09/15 1155        Assessment/Plan: s/p Debridement of right ischial ulcer  Continue dressing changes  LOS: 3 days    Breland Trouten K. 12/10/2015

## 2015-12-10 NOTE — Discharge Instructions (Signed)
Take doxycycline  once tonight and then twice a day for 12 more days. END date March 30th.   Take ciprofloxacin  once today and then twice a day for 12 more days. END date March 30th.   Doxycycline tablets or capsules What is this medicine? DOXYCYCLINE (dox i SYE kleen) is a tetracycline antibiotic. It kills certain bacteria or stops their growth. It is used to treat many kinds of infections, like dental, skin, respiratory, and urinary tract infections. It also treats acne, Lyme disease, malaria, and certain sexually transmitted infections. This medicine may be used for other purposes; ask your health care provider or pharmacist if you have questions. What should I tell my health care provider before I take this medicine? They need to know if you have any of these conditions: -liver disease -long exposure to sunlight like working outdoors -stomach problems like colitis -an unusual or allergic reaction to doxycycline, tetracycline antibiotics, other medicines, foods, dyes, or preservatives -pregnant or trying to get pregnant -breast-feeding How should I use this medicine? Take this medicine by mouth with a full glass of water. Follow the directions on the prescription label. It is best to take this medicine without food, but if it upsets your stomach take it with food. Take your medicine at regular intervals. Do not take your medicine more often than directed. Take all of your medicine as directed even if you think you are better. Do not skip doses or stop your medicine early. Talk to your pediatrician regarding the use of this medicine in children. While this drug may be prescribed for selected conditions, precautions do apply. Overdosage: If you think you have taken too much of this medicine contact a poison control center or emergency room at once. NOTE: This medicine is only for you. Do not share this medicine with others. What if I miss a dose? If you miss a dose, take it as soon  as you can. If it is almost time for your next dose, take only that dose. Do not take double or extra doses. What may interact with this medicine? -antacids -barbiturates -birth control pills -bismuth subsalicylate -carbamazepine -methoxyflurane -other antibiotics -phenytoin -vitamins that contain iron -warfarin This list may not describe all possible interactions. Give your health care provider a list of all the medicines, herbs, non-prescription drugs, or dietary supplements you use. Also tell them if you smoke, drink alcohol, or use illegal drugs. Some items may interact with your medicine. What should I watch for while using this medicine? Tell your doctor or health care professional if your symptoms do not improve. Do not treat diarrhea with over the counter products. Contact your doctor if you have diarrhea that lasts more than 2 days or if it is severe and watery. Do not take this medicine just before going to bed. It may not dissolve properly when you lay down and can cause pain in your throat. Drink plenty of fluids while taking this medicine to also help reduce irritation in your throat. This medicine can make you more sensitive to the sun. Keep out of the sun. If you cannot avoid being in the sun, wear protective clothing and use sunscreen. Do not use sun lamps or tanning beds/booths. Birth control pills may not work properly while you are taking this medicine. Talk to your doctor about using an extra method of birth control. If you are being treated for a sexually transmitted infection, avoid sexual contact until you have finished your treatment. Your sexual partner may also  need treatment. Avoid antacids, aluminum, calcium, magnesium, and iron products for 4 hours before and 2 hours after taking a dose of this medicine. If you are using this medicine to prevent malaria, you should still protect yourself from contact with mosquitos. Stay in screened-in areas, use mosquito nets, keep  your body covered, and use an insect repellent. What side effects may I notice from receiving this medicine? Side effects that you should report to your doctor or health care professional as soon as possible: -allergic reactions like skin rash, itching or hives, swelling of the face, lips, or tongue -difficulty breathing -fever -itching in the rectal or genital area -pain on swallowing -redness, blistering, peeling or loosening of the skin, including inside the mouth -severe stomach pain or cramps -unusual bleeding or bruising -unusually weak or tired -yellowing of the eyes or skin Side effects that usually do not require medical attention (report to your doctor or health care professional if they continue or are bothersome): -diarrhea -loss of appetite -nausea, vomiting This list may not describe all possible side effects. Call your doctor for medical advice about side effects. You may report side effects to FDA at 1-800-FDA-1088. Where should I keep my medicine? Keep out of the reach of children. Store at room temperature, below 30 degrees C (86 degrees F). Protect from light. Keep container tightly closed. Throw away any unused medicine after the expiration date. Taking this medicine after the expiration date can make you seriously ill. NOTE: This sheet is a summary. It may not cover all possible information. If you have questions about this medicine, talk to your doctor, pharmacist, or health care provider.    2016, [employer]. (2014-12-31 12:10:28)  Ciprofloxacin tablets What is this medicine? CIPROFLOXACIN (sip roe FLOX a sin) is a quinolone antibiotic. It is used to treat certain kinds of bacterial infections. It will not work for colds, flu, or other viral infections. This medicine may be used for other purposes; ask your health care provider or pharmacist if you have questions. What should I tell my health care provider before I take this medicine? They need to  know if you have any of these conditions: -bone problems -cerebral disease -history of low levels of potassium in the blood -joint problems -irregular heartbeat -kidney disease -myasthenia gravis -seizures -tendon problems -tingling of the fingers or toes, or other nerve disorder -an unusual or allergic reaction to ciprofloxacin, other antibiotics or medicines, foods, dyes, or preservatives -pregnant or trying to get pregnant -breast-feeding How should I use this medicine? Take this medicine by mouth with a glass of water. Follow the directions on the prescription label. Take your medicine at regular intervals. Do not take your medicine more often than directed. Take all of your medicine as directed even if you think your are better. Do not skip doses or stop your medicine early. You can take this medicine with food or on an empty stomach. It can be taken with a meal that contains dairy or calcium, but do not take it alone with a dairy product, like milk or yogurt or calcium-fortified juice. A special MedGuide will be given to you by the pharmacist with each prescription and refill. Be sure to read this information carefully each time. Talk to your pediatrician regarding the use of this medicine in children. Special care may be needed. Overdosage: If you think you have taken too much of this medicine contact a poison control center or emergency room at once. NOTE: This medicine is only for  you. Do not share this medicine with others. What if I miss a dose? If you miss a dose, take it as soon as you can. If it is almost time for your next dose, take only that dose. Do not take double or extra doses. What may interact with this medicine? Do not take this medicine with any of the following medications: -cisapride -droperidol -terfenadine -tizanidine This medicine may also interact with the following medications: -antacids -birth control pills -caffeine -cyclosporin -didanosine (ddI)  buffered tablets or powder -medicines for diabetes -medicines for inflammation like ibuprofen, naproxen -methotrexate -multivitamins -omeprazole -phenytoin -probenecid -sucralfate -theophylline -warfarin This list may not describe all possible interactions. Give your health care provider a list of all the medicines, herbs, non-prescription drugs, or dietary supplements you use. Also tell them if you smoke, drink alcohol, or use illegal drugs. Some items may interact with your medicine. What should I watch for while using this medicine? Tell your doctor or health care professional if your symptoms do not improve. Do not treat diarrhea with over the counter products. Contact your doctor if you have diarrhea that lasts more than 2 days or if it is severe and watery. You may get drowsy or dizzy. Do not drive, use machinery, or do anything that needs mental alertness until you know how this medicine affects you. Do not stand or sit up quickly, especially if you are an older patient. This reduces the risk of dizzy or fainting spells. This medicine can make you more sensitive to the sun. Keep out of the sun. If you cannot avoid being in the sun, wear protective clothing and use sunscreen. Do not use sun lamps or tanning beds/booths. Avoid antacids, aluminum, calcium, iron, magnesium, and zinc products for 6 hours before and 2 hours after taking a dose of this medicine. What side effects may I notice from receiving this medicine? Side effects that you should report to your doctor or health care professional as soon as possible: -allergic reactions like skin rash or hives, swelling of the face, lips, or tongue -anxious -confusion -depressed mood -diarrhea -fast, irregular heartbeat -hallucination, loss of contact with reality -joint, muscle, or tendon pain or swelling -pain, tingling, numbness in the hands or feet -suicidal thoughts or other mood changes -sunburn -unusually weak or tired Side  effects that usually do not require medical attention (report to your doctor or health care professional if they continue or are bothersome): -dry mouth -headache -nausea -trouble sleeping This list may not describe all possible side effects. Call your doctor for medical advice about side effects. You may report side effects to FDA at 1-800-FDA-1088. Where should I keep my medicine? Keep out of the reach of children. Store at room temperature below 30 degrees C (86 degrees F). Keep container tightly closed. Throw away any unused medicine after the expiration date. NOTE: This sheet is a summary. It may not cover all possible information. If you have questions about this medicine, talk to your doctor, pharmacist, or health care provider.    2016, [employer]. (2015-04-21 12:57:02)

## 2015-12-13 LAB — CULTURE, BLOOD (ROUTINE X 2)
CULTURE: NO GROWTH
CULTURE: NO GROWTH

## 2015-12-14 ENCOUNTER — Telehealth: Payer: Self-pay | Admitting: Internal Medicine

## 2015-12-14 NOTE — Telephone Encounter (Signed)
Need verbal order to add their social worker

## 2015-12-14 NOTE — Telephone Encounter (Signed)
Gave VO for HH csw, do you agree?

## 2015-12-14 NOTE — Telephone Encounter (Signed)
Thank you Helen. I agree with your plan 

## 2015-12-28 ENCOUNTER — Encounter (HOSPITAL_BASED_OUTPATIENT_CLINIC_OR_DEPARTMENT_OTHER): Payer: Medicaid Other | Attending: Surgery

## 2015-12-28 DIAGNOSIS — I10 Essential (primary) hypertension: Secondary | ICD-10-CM | POA: Insufficient documentation

## 2015-12-28 DIAGNOSIS — G8220 Paraplegia, unspecified: Secondary | ICD-10-CM | POA: Insufficient documentation

## 2015-12-28 DIAGNOSIS — L89314 Pressure ulcer of right buttock, stage 4: Secondary | ICD-10-CM | POA: Diagnosis present

## 2015-12-28 DIAGNOSIS — E785 Hyperlipidemia, unspecified: Secondary | ICD-10-CM | POA: Diagnosis not present

## 2016-01-04 DIAGNOSIS — L89314 Pressure ulcer of right buttock, stage 4: Secondary | ICD-10-CM | POA: Diagnosis not present

## 2016-01-10 ENCOUNTER — Encounter: Payer: Self-pay | Admitting: Internal Medicine

## 2016-01-10 NOTE — Progress Notes (Signed)
Patient ID: Cameron Zavala, male   DOB: 1976-03-28, 40 y.o.   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

## 2016-01-11 ENCOUNTER — Emergency Department (HOSPITAL_COMMUNITY): Payer: Medicaid Other

## 2016-01-11 ENCOUNTER — Encounter (HOSPITAL_COMMUNITY): Payer: Self-pay | Admitting: Emergency Medicine

## 2016-01-11 ENCOUNTER — Inpatient Hospital Stay (HOSPITAL_COMMUNITY): Payer: Medicaid Other

## 2016-01-11 ENCOUNTER — Inpatient Hospital Stay (HOSPITAL_COMMUNITY)
Admission: EM | Admit: 2016-01-11 | Discharge: 2016-01-13 | DRG: 853 | Disposition: A | Discharge status: Other Acute Inpatient Hospital | Payer: Medicaid Other | Attending: Internal Medicine | Admitting: Internal Medicine

## 2016-01-11 DIAGNOSIS — M726 Necrotizing fasciitis: Secondary | ICD-10-CM | POA: Diagnosis present

## 2016-01-11 DIAGNOSIS — R7303 Prediabetes: Secondary | ICD-10-CM | POA: Diagnosis not present

## 2016-01-11 DIAGNOSIS — E860 Dehydration: Secondary | ICD-10-CM | POA: Diagnosis present

## 2016-01-11 DIAGNOSIS — I10 Essential (primary) hypertension: Secondary | ICD-10-CM

## 2016-01-11 DIAGNOSIS — A419 Sepsis, unspecified organism: Secondary | ICD-10-CM | POA: Diagnosis present

## 2016-01-11 DIAGNOSIS — N179 Acute kidney failure, unspecified: Secondary | ICD-10-CM | POA: Diagnosis present

## 2016-01-11 DIAGNOSIS — Z9109 Other allergy status, other than to drugs and biological substances: Secondary | ICD-10-CM | POA: Diagnosis not present

## 2016-01-11 DIAGNOSIS — E872 Acidosis: Secondary | ICD-10-CM | POA: Diagnosis present

## 2016-01-11 DIAGNOSIS — K2970 Gastritis, unspecified, without bleeding: Secondary | ICD-10-CM | POA: Diagnosis present

## 2016-01-11 DIAGNOSIS — R6521 Severe sepsis with septic shock: Secondary | ICD-10-CM | POA: Diagnosis present

## 2016-01-11 DIAGNOSIS — Z8739 Personal history of other diseases of the musculoskeletal system and connective tissue: Secondary | ICD-10-CM

## 2016-01-11 DIAGNOSIS — L89152 Pressure ulcer of sacral region, stage 2: Secondary | ICD-10-CM | POA: Diagnosis present

## 2016-01-11 DIAGNOSIS — E785 Hyperlipidemia, unspecified: Secondary | ICD-10-CM | POA: Diagnosis not present

## 2016-01-11 DIAGNOSIS — Z791 Long term (current) use of non-steroidal anti-inflammatories (NSAID): Secondary | ICD-10-CM

## 2016-01-11 DIAGNOSIS — F17200 Nicotine dependence, unspecified, uncomplicated: Secondary | ICD-10-CM | POA: Diagnosis present

## 2016-01-11 DIAGNOSIS — N390 Urinary tract infection, site not specified: Secondary | ICD-10-CM | POA: Diagnosis present

## 2016-01-11 DIAGNOSIS — R109 Unspecified abdominal pain: Secondary | ICD-10-CM

## 2016-01-11 DIAGNOSIS — Z882 Allergy status to sulfonamides status: Secondary | ICD-10-CM | POA: Diagnosis not present

## 2016-01-11 DIAGNOSIS — R112 Nausea with vomiting, unspecified: Secondary | ICD-10-CM | POA: Diagnosis present

## 2016-01-11 DIAGNOSIS — N319 Neuromuscular dysfunction of bladder, unspecified: Secondary | ICD-10-CM | POA: Diagnosis present

## 2016-01-11 DIAGNOSIS — Z79899 Other long term (current) drug therapy: Secondary | ICD-10-CM | POA: Diagnosis not present

## 2016-01-11 DIAGNOSIS — N39498 Other specified urinary incontinence: Secondary | ICD-10-CM | POA: Diagnosis present

## 2016-01-11 DIAGNOSIS — D649 Anemia, unspecified: Secondary | ICD-10-CM | POA: Diagnosis not present

## 2016-01-11 DIAGNOSIS — R7989 Other specified abnormal findings of blood chemistry: Secondary | ICD-10-CM | POA: Diagnosis present

## 2016-01-11 DIAGNOSIS — R6520 Severe sepsis without septic shock: Secondary | ICD-10-CM | POA: Diagnosis present

## 2016-01-11 DIAGNOSIS — A4159 Other Gram-negative sepsis: Secondary | ICD-10-CM | POA: Diagnosis present

## 2016-01-11 DIAGNOSIS — K219 Gastro-esophageal reflux disease without esophagitis: Secondary | ICD-10-CM | POA: Diagnosis not present

## 2016-01-11 DIAGNOSIS — D72829 Elevated white blood cell count, unspecified: Secondary | ICD-10-CM | POA: Diagnosis not present

## 2016-01-11 DIAGNOSIS — A414 Sepsis due to anaerobes: Secondary | ICD-10-CM | POA: Diagnosis present

## 2016-01-11 DIAGNOSIS — Z888 Allergy status to other drugs, medicaments and biological substances status: Secondary | ICD-10-CM

## 2016-01-11 DIAGNOSIS — R0602 Shortness of breath: Secondary | ICD-10-CM

## 2016-01-11 DIAGNOSIS — G8220 Paraplegia, unspecified: Secondary | ICD-10-CM | POA: Diagnosis present

## 2016-01-11 DIAGNOSIS — L89159 Pressure ulcer of sacral region, unspecified stage: Secondary | ICD-10-CM

## 2016-01-11 HISTORY — DX: Unspecified injury at unspecified level of thoracic spinal cord, initial encounter: S24.109A

## 2016-01-11 LAB — COMPREHENSIVE METABOLIC PANEL WITH GFR
ALT: 29 U/L (ref 17–63)
AST: 55 U/L — ABNORMAL HIGH (ref 15–41)
Albumin: 2.5 g/dL — ABNORMAL LOW (ref 3.5–5.0)
Alkaline Phosphatase: 294 U/L — ABNORMAL HIGH (ref 38–126)
Anion gap: 22 — ABNORMAL HIGH (ref 5–15)
BUN: 51 mg/dL — ABNORMAL HIGH (ref 6–20)
CO2: 16 mmol/L — ABNORMAL LOW (ref 22–32)
Calcium: 8.1 mg/dL — ABNORMAL LOW (ref 8.9–10.3)
Chloride: 98 mmol/L — ABNORMAL LOW (ref 101–111)
Creatinine, Ser: 2.35 mg/dL — ABNORMAL HIGH (ref 0.61–1.24)
GFR calc Af Amer: 38 mL/min — ABNORMAL LOW
GFR calc non Af Amer: 33 mL/min — ABNORMAL LOW
Glucose, Bld: 72 mg/dL (ref 65–99)
Potassium: 4 mmol/L (ref 3.5–5.1)
Sodium: 136 mmol/L (ref 135–145)
Total Bilirubin: 1.8 mg/dL — ABNORMAL HIGH (ref 0.3–1.2)
Total Protein: 6.9 g/dL (ref 6.5–8.1)

## 2016-01-11 LAB — CBC WITH DIFFERENTIAL/PLATELET
Band Neutrophils: 26 %
Basophils Absolute: 0 10*3/uL (ref 0.0–0.1)
Basophils Relative: 0 %
Blasts: 0 %
Eosinophils Absolute: 0.1 10*3/uL (ref 0.0–0.7)
Eosinophils Relative: 1 %
HCT: 44.3 % (ref 39.0–52.0)
Hemoglobin: 15.5 g/dL (ref 13.0–17.0)
Lymphocytes Relative: 3 %
Lymphs Abs: 0.2 10*3/uL — ABNORMAL LOW (ref 0.7–4.0)
MCH: 29.5 pg (ref 26.0–34.0)
MCHC: 35 g/dL (ref 30.0–36.0)
MCV: 84.4 fL (ref 78.0–100.0)
Metamyelocytes Relative: 3 %
Monocytes Absolute: 0.1 10*3/uL (ref 0.1–1.0)
Monocytes Relative: 2 %
Myelocytes: 3 %
Neutro Abs: 6 10*3/uL (ref 1.7–7.7)
Neutrophils Relative %: 62 %
Other: 0 %
Platelets: 307 10*3/uL (ref 150–400)
Promyelocytes Absolute: 0 %
RBC: 5.25 MIL/uL (ref 4.22–5.81)
RDW: 14.3 % (ref 11.5–15.5)
WBC: 6.4 10*3/uL (ref 4.0–10.5)
nRBC: 0 /100{WBCs}

## 2016-01-11 LAB — URINALYSIS, ROUTINE W REFLEX MICROSCOPIC
Glucose, UA: NEGATIVE mg/dL
Ketones, ur: NEGATIVE mg/dL
Nitrite: POSITIVE — AB
Protein, ur: 30 mg/dL — AB
Specific Gravity, Urine: 1.025 (ref 1.005–1.030)
pH: 5 (ref 5.0–8.0)

## 2016-01-11 LAB — BASIC METABOLIC PANEL
Anion gap: 16 — ABNORMAL HIGH (ref 5–15)
BUN: 46 mg/dL — AB (ref 6–20)
CO2: 17 mmol/L — ABNORMAL LOW (ref 22–32)
CREATININE: 2.19 mg/dL — AB (ref 0.61–1.24)
Calcium: 6.5 mg/dL — ABNORMAL LOW (ref 8.9–10.3)
Chloride: 106 mmol/L (ref 101–111)
GFR calc Af Amer: 42 mL/min — ABNORMAL LOW (ref >60)
GFR, EST NON AFRICAN AMERICAN: 36 mL/min — AB (ref >60)
GLUCOSE: 93 mg/dL (ref 65–99)
POTASSIUM: 3.5 mmol/L (ref 3.5–5.1)
Sodium: 139 mmol/L (ref 135–145)

## 2016-01-11 LAB — I-STAT CG4 LACTIC ACID, ED: Lactic Acid, Venous: 10.24 mmol/L (ref 0.5–2.0)

## 2016-01-11 LAB — PROCALCITONIN: PROCALCITONIN: 38.72 ng/mL

## 2016-01-11 LAB — GLUCOSE, CAPILLARY
GLUCOSE-CAPILLARY: 83 mg/dL (ref 65–99)
Glucose-Capillary: 80 mg/dL (ref 65–99)

## 2016-01-11 LAB — URINE MICROSCOPIC-ADD ON

## 2016-01-11 LAB — LIPASE, BLOOD: LIPASE: 16 U/L (ref 11–51)

## 2016-01-11 LAB — LACTIC ACID, PLASMA: Lactic Acid, Venous: 4.7 mmol/L (ref 0.5–2.0)

## 2016-01-11 LAB — MRSA PCR SCREENING: MRSA BY PCR: NEGATIVE

## 2016-01-11 MED ORDER — ONDANSETRON HCL 4 MG PO TABS
4.0000 mg | ORAL_TABLET | Freq: Four times a day (QID) | ORAL | Status: DC | PRN
Start: 2016-01-11 — End: 2016-01-13

## 2016-01-11 MED ORDER — SODIUM CHLORIDE 0.9% FLUSH
3.0000 mL | Freq: Two times a day (BID) | INTRAVENOUS | Status: DC
  Administered 2016-01-12 (×3): 3 mL via INTRAVENOUS

## 2016-01-11 MED ORDER — ACETAMINOPHEN 650 MG RE SUPP: 650.0000 mg | Freq: Four times a day (QID) | RECTAL | Status: DC | PRN

## 2016-01-11 MED ORDER — ACETAMINOPHEN 500 MG PO TABS
1000.0000 mg | ORAL_TABLET | Freq: Once | ORAL | Status: AC
  Administered 2016-01-11: 1000 mg via ORAL
  Filled 2016-01-11: qty 2

## 2016-01-11 MED ORDER — VANCOMYCIN HCL IN DEXTROSE 1-5 GM/200ML-% IV SOLN: 1000.0000 mg | INTRAVENOUS | Status: DC

## 2016-01-11 MED ORDER — DAKINS (1/4 STRENGTH) 0.125 % EX SOLN
Freq: Two times a day (BID) | CUTANEOUS | Status: DC
Start: 2016-01-11 — End: 2016-01-13
  Administered 2016-01-11: 19:00:00
  Filled 2016-01-11: qty 473

## 2016-01-11 MED ORDER — PIPERACILLIN-TAZOBACTAM 3.375 G IVPB 30 MIN
3.3750 g | INTRAVENOUS | Status: AC
  Administered 2016-01-11: 3.375 g via INTRAVENOUS
  Filled 2016-01-11: qty 50

## 2016-01-11 MED ORDER — LACTATED RINGERS IV BOLUS (SEPSIS)
1000.0000 mL | Freq: Once | INTRAVENOUS | Status: AC
  Administered 2016-01-11: 1000 mL via INTRAVENOUS

## 2016-01-11 MED ORDER — DIATRIZOATE MEGLUMINE & SODIUM 66-10 % PO SOLN: 15.0000 mL | Freq: Two times a day (BID) | ORAL | Status: AC

## 2016-01-11 MED ORDER — PIPERACILLIN-TAZOBACTAM 3.375 G IVPB
3.3750 g | Freq: Three times a day (TID) | INTRAVENOUS | Status: DC
  Administered 2016-01-11 – 2016-01-12 (×4): 3.375 g via INTRAVENOUS
  Filled 2016-01-11 (×3): qty 50

## 2016-01-11 MED ORDER — LOPERAMIDE HCL 2 MG PO CAPS: 2.0000 mg | ORAL_CAPSULE | Freq: Three times a day (TID) | ORAL | Status: DC | PRN

## 2016-01-11 MED ORDER — DOCUSATE SODIUM 100 MG PO CAPS: 100.0000 mg | ORAL_CAPSULE | Freq: Two times a day (BID) | ORAL | Status: DC

## 2016-01-11 MED ORDER — LACTATED RINGERS IV SOLN: INTRAVENOUS | Status: DC

## 2016-01-11 MED ORDER — LACTATED RINGERS IV BOLUS (SEPSIS): 1000.0000 mL | Freq: Three times a day (TID) | INTRAVENOUS | Status: DC | PRN

## 2016-01-11 MED ORDER — SILVER NITRATE-POT NITRATE 75-25 % EX MISC
1.0000 "application " | CUTANEOUS | Status: DC | PRN
  Filled 2016-01-11: qty 1

## 2016-01-11 MED ORDER — SODIUM CHLORIDE 0.9 % IV SOLN
Freq: Once | INTRAVENOUS | Status: AC
  Administered 2016-01-11: 14:00:00 via INTRAVENOUS

## 2016-01-11 MED ORDER — MORPHINE SULFATE (PF) 2 MG/ML IV SOLN: 1.0000 mg | INTRAVENOUS | Status: DC | PRN

## 2016-01-11 MED ORDER — SODIUM CHLORIDE 0.9 % IV SOLN
Freq: Once | INTRAVENOUS | Status: AC
  Administered 2016-01-11: 16:00:00 via INTRAVENOUS

## 2016-01-11 MED ORDER — ONDANSETRON HCL 4 MG/2ML IJ SOLN
4.0000 mg | Freq: Four times a day (QID) | INTRAMUSCULAR | Status: DC | PRN
  Administered 2016-01-11: 4 mg via INTRAVENOUS
  Filled 2016-01-11: qty 2

## 2016-01-11 MED ORDER — SODIUM CHLORIDE 0.9 % IV BOLUS (SEPSIS)
2000.0000 mL | Freq: Once | INTRAVENOUS | Status: AC
  Administered 2016-01-11: 2000 mL via INTRAVENOUS

## 2016-01-11 MED ORDER — SODIUM CHLORIDE 0.9 % IV SOLN
INTRAVENOUS | Status: DC
  Administered 2016-01-11 – 2016-01-12 (×2): via INTRAVENOUS

## 2016-01-11 MED ORDER — VANCOMYCIN HCL IN DEXTROSE 1-5 GM/200ML-% IV SOLN
1000.0000 mg | INTRAVENOUS | Status: AC
  Administered 2016-01-11: 1000 mg via INTRAVENOUS
  Filled 2016-01-11: qty 200

## 2016-01-11 MED ORDER — ONDANSETRON HCL 4 MG/2ML IJ SOLN
4.0000 mg | Freq: Once | INTRAMUSCULAR | Status: AC
  Administered 2016-01-11: 4 mg via INTRAVENOUS

## 2016-01-11 MED ORDER — ENOXAPARIN SODIUM 40 MG/0.4ML ~~LOC~~ SOLN
40.0000 mg | SUBCUTANEOUS | Status: DC
  Administered 2016-01-12: 40 mg via SUBCUTANEOUS
  Filled 2016-01-11: qty 0.4

## 2016-01-11 MED ORDER — CYCLOBENZAPRINE HCL 10 MG PO TABS: 10.0000 mg | ORAL_TABLET | Freq: Three times a day (TID) | ORAL | Status: DC | PRN

## 2016-01-11 MED ORDER — POLYETHYLENE GLYCOL 3350 17 G PO PACK: 17.0000 g | PACK | Freq: Every day | ORAL | Status: DC | PRN

## 2016-01-11 MED ORDER — ONDANSETRON HCL 4 MG/2ML IJ SOLN
INTRAMUSCULAR | Status: AC
  Filled 2016-01-11: qty 2

## 2016-01-11 MED ORDER — ACETAMINOPHEN 325 MG PO TABS: 650.0000 mg | ORAL_TABLET | Freq: Four times a day (QID) | ORAL | Status: DC | PRN

## 2016-01-11 MED ORDER — SODIUM CHLORIDE 0.9 % IV BOLUS (SEPSIS)
1000.0000 mL | Freq: Once | INTRAVENOUS | Status: AC
  Administered 2016-01-11: 1000 mL via INTRAVENOUS

## 2016-01-11 MED ORDER — PANTOPRAZOLE SODIUM 40 MG IV SOLR
40.0000 mg | INTRAVENOUS | Status: DC
  Administered 2016-01-11 – 2016-01-12 (×2): 40 mg via INTRAVENOUS
  Filled 2016-01-11 (×2): qty 40

## 2016-01-11 MED ORDER — ALBUTEROL SULFATE (2.5 MG/3ML) 0.083% IN NEBU: 2.5000 mg | INHALATION_SOLUTION | RESPIRATORY_TRACT | Status: DC | PRN

## 2016-01-11 MED ORDER — BISMUTH SUBSALICYLATE 262 MG/15ML PO SUSP
30.0000 mL | Freq: Two times a day (BID) | ORAL | Status: DC
  Administered 2016-01-11: 30 mL via ORAL
  Filled 2016-01-11 (×2): qty 118
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
## 2016-01-11 NOTE — Progress Notes (Signed)
Spoke with Dr Bennie Pierini about admission   entered in d/c instructions  [facility] Go on 01/16/2016 You have a 11 am appt on 01/16/16 to renew your [facility] card [address] [phone] mc [place] [address] [phone]

## 2016-01-11 NOTE — Progress Notes (Signed)
CRITICAL VALUE ALERT  Critical value received:  Lactic acid 4.7  Date of notification:  01/11/16  Time of notification:  2010  Critical value read back:Yes.    Nurse who received alert:  Heloise Purpura RN  MD notified (1st page):  Kirby-Graham NP  Time of first page:  2015  MD notified (2nd page):  Time of second page:  Responding MD:  Tereso Newcomer NP  Time MD responded:  2020
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
## 2016-01-11 NOTE — ED Notes (Signed)
Bed: WA16 Expected date:  Expected time:  Means of arrival:  Comments: No bed  

## 2016-01-11 NOTE — ED Notes (Signed)
MD at bedside. Hongalgi

## 2016-01-11 NOTE — Op Note (Signed)
01/11/2016  7:46 PM  PATIENT:  Cameron Zavala  40 y.o. male  Patient Care Team: Beather Arbour, MD as PCP - General  PRE-OPERATIVE DIAGNOSIS:  Malodorous sacral decubitus.  Possible necrotizing fasciitis.  POST-OPERATIVE DIAGNOSIS:    Malodorous sacral decubitus.  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  PROCEDURE:  Debridement of sacral decubitus wound with unroofing of necrotizing fasciitis and packing  SURGEON:  Vallen Calabrese C.   ASSISTANT: ICU RN Becky   ANESTHESIA:   none (paraplegic)  EBL:     Delay start of Pharmacological VTE agent (>24hrs) due to surgical blood loss or risk of bleeding:  no  DRAINS: none   SPECIMEN:  Source of Specimen:  necrotic infected posterior gluteus muscle & fat  DISPOSITION OF SPECIMEN:  Microbiology  COUNTS:  YES  PLAN OF CARE: Admit to inpatient   PATIENT DISPOSITION:  ICU - extubated and critical.  INDICATION: Pleasant Hispanic male with paraplegia and chronic right ischial tuberosity decubitus.  Comes in with fevers, renal failure, septic shock.  Foul wound.  Suspicious for necrosis.  Possible necrotizing fasciitis.  Recommendation made for examination and debridement.  The anatomy and physiology of skin abscesses was discussed. Pathophysiology of SQ abscess, possible progression to fasciitis & sepsis, etc discussed . I stressed good hygiene & wound care. Possible redebridement was discussed as well.   Possibility of recurrence was discussed. Risks, benefits, alternatives were discussed. I noted a good likelihood this will help address the problem. Risks of anesthesia and other risks discussed. Questions answered. The patient is does wish to proceed.   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  Right fat.  Right ischial tuberosity completely exposed and draped in necrotic fat and pus.    Fasciitis tracking 15 cm  distally on the right posterior lateral thigh.  Came around to axillary line on posterior thigh and gluteus.  Did not breach towards the midline rectal wall.  Tracked cephalad to posterior lumbar region even above the sacrum x 20cm.  5-10 cm depth.  10cm deep abscess with 7x7cm opening  Wound measures 35cm by 10cm at its widest.  It is 10cm deepest from the skin   DESCRIPTION:   Informed consent was confirmed. The patient received IV antibiotics.  Because he was tachycardic and labile, decided to do the initial wound evaluation debridement at the bedside in the intensive care unit.  The patient was positioned left side down decubitus.   I used sharp dissection to debride out necrotic skin and fat in the subcutaneous tissues.  Use scalpel and heavy scissors Mayos.  Sharply debrided down to remove the necrotic and malodorous fat and gluteal muscle until all the usual tuberosity was exposed.    I could tell there was concerning fascia necrosis.  I could bring the hand and fist over the gluteus and sacrum up towards the lumbar region.  This was the area most foul discharge and necrosis with brackish fluid.  Very suspicious for necrotizing fasciitis.  I excised some necrotic fascia and fat in the area and sent that for culture.  An aggressive debridement until I came to much healthier tissue.  More bleeding tissue.  I then focused more distally and encountered a pocket on the posterior thigh fascia as well.  Less brackish fluid.  Hold more firm 15 cm distal to the decubitus opening.  Stayed firm at the midaxillary line.  I swept more medially.  It does not seem to go up to the scrotum.  He does not seem to go and up the levators along the rectum.  There is no evidence of any purulence or fluctuance perianally.  Again did sharp debridement until necrotic fat muscle and fascia was removed.  I got to healthier bleeding muscle fat and skin.  Hemostasis ensured with pressure.  Wound packed with large Kerlix  gauze soaked in Dakin's solution.  Pressure held.  Hemostasis good.  He tolerated well.  Because I felt like I had opened up all the pockets and done debridement most of the necrotic and even more of the infected tissue, I decided to stop after 20 minutes of aggressive sharp debridement.  Patient tolerated the procedure well.  No pain or discomfort.  I explained the operative findings to the patient & the ICU nurse.  Suspect that he will need reoperation with probable counterincisions on his lower back or mid thigh.  I think the next operation should be done in the operating room under at least deep sedation if not anesthesia to allow better opportunity for hemostasis and deeper debridement.  However he is mentating fine and his blood pressure is improving with fluid resuscitation.  He is receiving IV antibiotics.  We'll consider repeat operation in the morning when he is more hemodynamically stable.  Ardeth Sportsman, M.D., F.A.C.S. Gastrointestinal and Minimally Invasive Surgery [facility] [address] [place], [place] [address] [phone] Main / Paging

## 2016-01-11 NOTE — ED Notes (Signed)
Wound vac taken out of pt's wound per [facility] Md's request for assessment.

## 2016-01-11 NOTE — ED Notes (Signed)
Spoke to [employer] with wound care re pt's wound vac.  States send wound vac home with pt's family and place NS wet-to-dry dressing BID while pt is in the hospital.  [facility] MD made aware.

## 2016-01-11 NOTE — Progress Notes (Signed)
Pharmacy Antibiotic Note  Cameron Zavala is a 40 y.o. male admitted on 01/11/2016 with a chronic sacral pressure ulcer and wound vac in place, sent from the wound center with fever and tachycardia. Lactic acid is elevated. Code sepsis is activated. Pharmacy has been consulted for Vanc and Zosyn dosing. SCr is elevated on admission, CrCl ~41ml/min. No hx of CKD.  Plan: Give Zosyn 3.375g IV x 1 over STAT then Q8H, each infused over 4hrs. Give Vanc 1g IV x 1 STAT then q24h. Measure Vanc trough at steady state. Follow up renal fxn, culture results, and clinical course.  Height: 5\' 4"  (162.6 cm) Weight: 130 lb (58.968 kg) IBW/kg (Calculated) : 59.2  Temp (24hrs), Avg:100.5 F (38.1 C), Min:100.5 F (38.1 C), Max:100.5 F (38.1 C)   Recent Labs Lab 01/11/16 1353 01/11/16 1409  WBC 6.4  --   CREATININE 2.35*  --   LATICACIDVEN  --  10.24*    Estimated Creatinine Clearance: 35.2 mL/min (by C-G formula based on Cr of 2.35).    Allergies  Allergen Reactions  . Baclofen     Itching   . Carbamazepine     Rash and hives  . Sulfonamide Derivatives   . Tape Itching    Plastic tape.  Can only use paper tape.  . Statins Itching and Rash    Antimicrobials this admission: Zosyn 4/19 >>  Vanc 4/19 >>   Dose adjustments this admission:  Microbiology results: 4/19 BCx: 4/19 UCx:   Thank you for allowing pharmacy to be a part of this patient's care.  Charolotte Eke, PharmD, pager [phone]. 01/11/2016,2:59 PM.

## 2016-01-12 ENCOUNTER — Inpatient Hospital Stay (HOSPITAL_COMMUNITY): Payer: Medicaid Other | Admitting: Anesthesiology

## 2016-01-12 ENCOUNTER — Encounter (HOSPITAL_COMMUNITY)
Admission: EM | Disposition: A | Discharge status: Other Acute Inpatient Hospital | Payer: Self-pay | Source: Home / Self Care | Attending: Internal Medicine

## 2016-01-12 ENCOUNTER — Inpatient Hospital Stay (HOSPITAL_COMMUNITY): Payer: Medicaid Other

## 2016-01-12 DIAGNOSIS — N390 Urinary tract infection, site not specified: Secondary | ICD-10-CM | POA: Diagnosis present

## 2016-01-12 DIAGNOSIS — A4159 Other Gram-negative sepsis: Secondary | ICD-10-CM | POA: Diagnosis present

## 2016-01-12 DIAGNOSIS — M726 Necrotizing fasciitis: Secondary | ICD-10-CM

## 2016-01-12 DIAGNOSIS — A414 Sepsis due to anaerobes: Secondary | ICD-10-CM | POA: Diagnosis present

## 2016-01-12 DIAGNOSIS — D72829 Elevated white blood cell count, unspecified: Secondary | ICD-10-CM

## 2016-01-12 DIAGNOSIS — N179 Acute kidney failure, unspecified: Secondary | ICD-10-CM

## 2016-01-12 DIAGNOSIS — R7881 Bacteremia: Secondary | ICD-10-CM

## 2016-01-12 DIAGNOSIS — D649 Anemia, unspecified: Secondary | ICD-10-CM | POA: Diagnosis not present

## 2016-01-12 HISTORY — PX: IRRIGATION AND DEBRIDEMENT BUTTOCKS: SHX6601

## 2016-01-12 LAB — COMPREHENSIVE METABOLIC PANEL
ALT: 42 U/L (ref 17–63)
ANION GAP: 10 (ref 5–15)
AST: 94 U/L — ABNORMAL HIGH (ref 15–41)
Albumin: 1.8 g/dL — ABNORMAL LOW (ref 3.5–5.0)
Alkaline Phosphatase: 128 U/L — ABNORMAL HIGH (ref 38–126)
BUN: 44 mg/dL — ABNORMAL HIGH (ref 6–20)
CALCIUM: 6.5 mg/dL — AB (ref 8.9–10.3)
CHLORIDE: 110 mmol/L (ref 101–111)
CO2: 19 mmol/L — AB (ref 22–32)
Creatinine, Ser: 1.94 mg/dL — ABNORMAL HIGH (ref 0.61–1.24)
GFR calc non Af Amer: 42 mL/min — ABNORMAL LOW (ref >60)
GFR, EST AFRICAN AMERICAN: 48 mL/min — AB (ref >60)
Glucose, Bld: 88 mg/dL (ref 65–99)
Potassium: 4.3 mmol/L (ref 3.5–5.1)
SODIUM: 139 mmol/L (ref 135–145)
Total Bilirubin: 2.3 mg/dL — ABNORMAL HIGH (ref 0.3–1.2)
Total Protein: 5.3 g/dL — ABNORMAL LOW (ref 6.5–8.1)

## 2016-01-12 LAB — GLUCOSE, CAPILLARY
GLUCOSE-CAPILLARY: 112 mg/dL — AB (ref 65–99)
GLUCOSE-CAPILLARY: 126 mg/dL — AB (ref 65–99)
Glucose-Capillary: 72 mg/dL (ref 65–99)
Glucose-Capillary: 81 mg/dL (ref 65–99)
Glucose-Capillary: 65 mg/dL (ref 65–99)
Glucose-Capillary: 67 mg/dL (ref 65–99)
Glucose-Capillary: 113 mg/dL — ABNORMAL HIGH (ref 65–99)

## 2016-01-12 LAB — CBC
HCT: 27 % — ABNORMAL LOW (ref 39.0–52.0)
HCT: 28 % — ABNORMAL LOW (ref 39.0–52.0)
HEMOGLOBIN: 9.1 g/dL — AB (ref 13.0–17.0)
Hemoglobin: 9.4 g/dL — ABNORMAL LOW (ref 13.0–17.0)
MCH: 28.6 pg (ref 26.0–34.0)
MCH: 28.4 pg (ref 26.0–34.0)
MCHC: 33.7 g/dL (ref 30.0–36.0)
MCHC: 33.6 g/dL (ref 30.0–36.0)
MCV: 84.9 fL (ref 78.0–100.0)
MCV: 84.6 fL (ref 78.0–100.0)
PLATELETS: 276 10*3/uL (ref 150–400)
Platelets: 279 10*3/uL (ref 150–400)
RBC: 3.18 MIL/uL — AB (ref 4.22–5.81)
RBC: 3.31 MIL/uL — AB (ref 4.22–5.81)
RDW: 14.5 % (ref 11.5–15.5)
RDW: 14.6 % (ref 11.5–15.5)
WBC: 27.5 10*3/uL — ABNORMAL HIGH (ref 4.0–10.5)
WBC: 24.5 10*3/uL — AB (ref 4.0–10.5)

## 2016-01-12 LAB — PREPARE RBC (CROSSMATCH)

## 2016-01-12 LAB — PHOSPHORUS: Phosphorus: 4.6 mg/dL (ref 2.5–4.6)

## 2016-01-12 LAB — PROCALCITONIN: PROCALCITONIN: 40.52 ng/mL

## 2016-01-12 LAB — LACTIC ACID, PLASMA
LACTIC ACID, VENOUS: 2.2 mmol/L — AB (ref 0.5–2.0)
Lactic Acid, Venous: 2.1 mmol/L (ref 0.5–2.0)

## 2016-01-12 LAB — ABO/RH: ABO/RH(D): O POS

## 2016-01-12 LAB — MAGNESIUM: Magnesium: 2 mg/dL (ref 1.7–2.4)

## 2016-01-12 SURGERY — IRRIGATION AND DEBRIDEMENT BUTTOCKS
Anesthesia: General | Laterality: Right

## 2016-01-12 MED ORDER — ALBUTEROL SULFATE (2.5 MG/3ML) 0.083% IN NEBU: 2.5000 mg | INHALATION_SOLUTION | RESPIRATORY_TRACT | Status: DC | PRN

## 2016-01-12 MED ORDER — FENTANYL CITRATE (PF) 250 MCG/5ML IJ SOLN
INTRAMUSCULAR | Status: DC | PRN
  Administered 2016-01-12 (×4): 50 ug via INTRAVENOUS

## 2016-01-12 MED ORDER — DEXTROSE-NACL 5-0.9 % IV SOLN: 125.0000 mL | INTRAVENOUS | Status: DC

## 2016-01-12 MED ORDER — ENSURE ENLIVE PO LIQD: 237.0000 mL | Freq: Three times a day (TID) | ORAL | Status: DC

## 2016-01-12 MED ORDER — 0.9 % SODIUM CHLORIDE (POUR BTL) OPTIME
TOPICAL | Status: DC | PRN
  Administered 2016-01-12: 1000 mL

## 2016-01-12 MED ORDER — ONDANSETRON HCL 4 MG/2ML IJ SOLN
INTRAMUSCULAR | Status: AC
  Filled 2016-01-12: qty 2

## 2016-01-12 MED ORDER — LACTATED RINGERS IV SOLN
INTRAVENOUS | Status: DC | PRN
  Administered 2016-01-12: 10:00:00 via INTRAVENOUS

## 2016-01-12 MED ORDER — VANCOMYCIN HCL IN DEXTROSE 750-5 MG/150ML-% IV SOLN
750.0000 mg | Freq: Two times a day (BID) | INTRAVENOUS | Status: DC
  Administered 2016-01-12 (×2): 750 mg via INTRAVENOUS
  Filled 2016-01-12 (×2): qty 150

## 2016-01-12 MED ORDER — ONDANSETRON HCL 4 MG PO TABS: 4.0000 mg | ORAL_TABLET | Freq: Four times a day (QID) | ORAL | Status: DC | PRN

## 2016-01-12 MED ORDER — DEXTROSE-NACL 5-0.9 % IV SOLN
INTRAVENOUS | Status: DC
  Administered 2016-01-12: 15:00:00 via INTRAVENOUS

## 2016-01-12 MED ORDER — SODIUM CHLORIDE 0.9 % IV SOLN
INTRAVENOUS | Status: DC
  Administered 2016-01-12 (×2): via INTRAVENOUS

## 2016-01-12 MED ORDER — ADULT MULTIVITAMIN LIQUID CH
15.0000 mL | Freq: Every day | ORAL | Status: DC
  Filled 2016-01-12: qty 15

## 2016-01-12 MED ORDER — DEXTROSE 50 % IV SOLN
25.0000 mL | Freq: Once | INTRAVENOUS | Status: AC
  Administered 2016-01-12: 25 mL via INTRAVENOUS

## 2016-01-12 MED ORDER — ACETAMINOPHEN 325 MG PO TABS: 650.0000 mg | ORAL_TABLET | Freq: Four times a day (QID) | ORAL | Status: DC | PRN

## 2016-01-12 MED ORDER — PANTOPRAZOLE SODIUM 40 MG IV SOLR: 40.0000 mg | INTRAVENOUS | Status: DC

## 2016-01-12 MED ORDER — PROPOFOL 10 MG/ML IV BOLUS
INTRAVENOUS | Status: DC | PRN
  Administered 2016-01-12: 200 mg via INTRAVENOUS

## 2016-01-12 MED ORDER — PHENYLEPHRINE HCL 10 MG/ML IJ SOLN
INTRAMUSCULAR | Status: DC | PRN
  Administered 2016-01-12 (×3): 80 ug via INTRAVENOUS

## 2016-01-12 MED ORDER — LIDOCAINE HCL (CARDIAC) 20 MG/ML IV SOLN
INTRAVENOUS | Status: AC
  Filled 2016-01-12: qty 5

## 2016-01-12 MED ORDER — LIDOCAINE HCL (CARDIAC) 20 MG/ML IV SOLN
INTRAVENOUS | Status: DC | PRN
  Administered 2016-01-12: 80 mg via INTRATRACHEAL

## 2016-01-12 MED ORDER — FENTANYL CITRATE (PF) 100 MCG/2ML IJ SOLN
INTRAMUSCULAR | Status: AC
  Filled 2016-01-12: qty 2

## 2016-01-12 MED ORDER — PIPERACILLIN-TAZOBACTAM 3.375 G IVPB: 3.3750 g | Freq: Three times a day (TID) | INTRAVENOUS | Status: DC

## 2016-01-12 MED ORDER — HYDROMORPHONE HCL 1 MG/ML IJ SOLN: 0.2500 mg | INTRAMUSCULAR | Status: DC | PRN

## 2016-01-12 MED ORDER — ADULT MULTIVITAMIN LIQUID CH: 15.0000 mL | Freq: Every day | ORAL | Status: DC

## 2016-01-12 MED ORDER — ENOXAPARIN SODIUM 40 MG/0.4ML ~~LOC~~ SOLN: 40.0000 mg | SUBCUTANEOUS | Status: DC

## 2016-01-12 MED ORDER — DIPHENHYDRAMINE HCL 25 MG PO CAPS: 25.0000 mg | ORAL_CAPSULE | Freq: Four times a day (QID) | ORAL | Status: DC | PRN

## 2016-01-12 MED ORDER — DAKINS (1/4 STRENGTH) 0.125 % EX SOLN: Freq: Two times a day (BID) | CUTANEOUS | Status: DC

## 2016-01-12 MED ORDER — MORPHINE SULFATE (PF) 2 MG/ML IV SOLN: 1.0000 mg | INTRAVENOUS | Status: DC | PRN

## 2016-01-12 MED ORDER — VANCOMYCIN HCL IN DEXTROSE 750-5 MG/150ML-% IV SOLN: 750.0000 mg | Freq: Two times a day (BID) | INTRAVENOUS | Status: DC

## 2016-01-12 MED ORDER — ONDANSETRON HCL 4 MG/2ML IJ SOLN
INTRAMUSCULAR | Status: DC | PRN
  Administered 2016-01-12: 4 mg via INTRAVENOUS

## 2016-01-12 MED ORDER — ROCURONIUM BROMIDE 100 MG/10ML IV SOLN
INTRAVENOUS | Status: AC
  Filled 2016-01-12: qty 1

## 2016-01-12 MED ORDER — PROPOFOL 10 MG/ML IV BOLUS
INTRAVENOUS | Status: AC
  Filled 2016-01-12: qty 20

## 2016-01-12 MED ORDER — PHENYLEPHRINE 40 MCG/ML (10ML) SYRINGE FOR IV PUSH (FOR BLOOD PRESSURE SUPPORT)
PREFILLED_SYRINGE | INTRAVENOUS | Status: AC
  Filled 2016-01-12: qty 10

## 2016-01-12 MED ORDER — DEXTROSE 50 % IV SOLN
INTRAVENOUS | Status: AC
  Filled 2016-01-12: qty 50

## 2016-01-12 SURGICAL SUPPLY — 31 items
BANDAGE ELASTIC 6 VELCRO ST LF (GAUZE/BANDAGES/DRESSINGS) ×2 IMPLANT
BLADE SURG 15 STRL LF DISP TIS (BLADE) ×1 IMPLANT
BLADE SURG 15 STRL SS (BLADE)
BNDG GAUZE ELAST 4 BULKY (GAUZE/BANDAGES/DRESSINGS) ×6 IMPLANT
COVER SURGICAL LIGHT HANDLE (MISCELLANEOUS) ×4 IMPLANT
DECANTER SPIKE VIAL GLASS SM (MISCELLANEOUS) IMPLANT
DRAPE LAPAROSCOPIC ABDOMINAL (DRAPES) ×2 IMPLANT
DRSG PAD ABDOMINAL 8X10 ST (GAUZE/BANDAGES/DRESSINGS) IMPLANT
ELECT PENCIL ROCKER SW 15FT (MISCELLANEOUS) ×1 IMPLANT
ELECT REM PT RETURN 9FT ADLT (ELECTROSURGICAL) ×3
ELECTRODE REM PT RTRN 9FT ADLT (ELECTROSURGICAL) ×1 IMPLANT
GAUZE SPONGE 4X4 12PLY STRL (GAUZE/BANDAGES/DRESSINGS) IMPLANT
GLOVE BIO SURGEON STRL SZ7.5 (GLOVE) ×3 IMPLANT
GOWN STRL REUS W/TWL LRG LVL3 (GOWN DISPOSABLE) ×8 IMPLANT
KIT BASIN OR (CUSTOM PROCEDURE TRAY) ×3 IMPLANT
NDL HYPO 25X1 1.5 SAFETY (NEEDLE) IMPLANT
NEEDLE HYPO 25X1 1.5 SAFETY (NEEDLE) IMPLANT
NS IRRIG 1000ML POUR BTL (IV SOLUTION) ×3 IMPLANT
PACK GENERAL/GYN (CUSTOM PROCEDURE TRAY) IMPLANT
PAD ABD 8X10 STRL (GAUZE/BANDAGES/DRESSINGS) ×6 IMPLANT
SPONGE LAP 18X18 X RAY DECT (DISPOSABLE) ×1 IMPLANT
SUT MNCRL AB 4-0 PS2 18 (SUTURE) IMPLANT
SUT VIC AB 3-0 SH 27 (SUTURE)
SUT VIC AB 3-0 SH 27XBRD (SUTURE) IMPLANT
SWAB COLLECTION DEVICE MRSA (MISCELLANEOUS) ×2 IMPLANT
SWAB CULTURE ESWAB REG 1ML (MISCELLANEOUS) IMPLANT
SYR BULB 3OZ (MISCELLANEOUS) ×1 IMPLANT
SYR CONTROL 10ML LL (SYRINGE) IMPLANT
TAPE CLOTH SURG 4X10 WHT LF (GAUZE/BANDAGES/DRESSINGS) ×2 IMPLANT
TOWEL OR 17X26 10 PK STRL BLUE (TOWEL DISPOSABLE) ×5 IMPLANT
YANKAUER SUCT BULB TIP NO VENT (SUCTIONS) ×1 IMPLANT

## 2016-01-12 NOTE — Op Note (Signed)
01/11/2016 - 01/12/2016  11:33 AM  PATIENT:  Cameron Zavala  40 y.o. male  PRE-OPERATIVE DIAGNOSIS:  Necrotizing fasciitis lower back and right buttock  POST-OPERATIVE DIAGNOSIS:  Necrotizing fasciitis lower back and right buttock and thigh  PROCEDURE:  Procedure(s): IRRIGATION AND DEBRIDEMENT LOWER BACK AND RIGHT BUTTOCK (Right)  SURGEON:  Surgeon(s) and Role:    * Griselda Miner, MD - Primary  PHYSICIAN ASSISTANT:   ASSISTANTS: none   ANESTHESIA:   general  EBL:  Total I/O In: 375 [I.V.:225; IV Piggyback:150] Out: 575 [Urine:475; Blood:100]  BLOOD ADMINISTERED:none  DRAINS: none   LOCAL MEDICATIONS USED:  NONE  SPECIMEN:  Source of Specimen:  cultures  DISPOSITION OF SPECIMEN:  lab  COUNTS:  YES  TOURNIQUET:  * No tourniquets in log *  DICTATION: .Dragon Dictation   After informed consent was obtained the patient was brought to the operating room and left in the supine position on the stretcher. After adequate induction of general anesthesia the patient was moved into a prone position on the operating room table and all pressure points are padded. The patient's back, buttock and right thigh area were prepped with Betadine and draped in usual sterile manner. An appropriate timeout was performed. There was a necrotic opening in the right ischial area. This area was probed and the cavity seemed to open superiorly through the buttock and inferiorly down the back of the right thigh. The skin and subcutaneous tissue in both directions was opened sharply with the electrocautery until the tissue planes seemed to hold without separation. There was necrotic tissue and dishwater fluid as well as pus surrounding the muscle groups of the right thigh. All of these tissue planes were opened Bluntly. Cultures were obtained. Superiorly once the cavity was opened we were essentially looking at the bony pelvis and all of the tendinous attachments to the pelvis appeared dead. Some of this  was debrided sharply with the electrocautery. Once the fascial planes were opened around the muscles of the right thigh there was some bleeding of the muscles and the muscles did have a pink color suggesting that they were viable. Intraoperatively I spoke with orthopedics regarding the appearance of the pelvis and it was their opinion that to control infection the patient would likely need a hemipelvectomy which the orthopedic surgeons in [place] do not do. At this point the open wound was packed with multiple Kerlix gauze rolls moistened with saline and the thigh was covered with ABVD pads and an Ace wrap and dressings were applied. At the end of the case all needle sponge and counts were correct. The patient will be taken back to the ICU for further resuscitation measures.  PLAN OF CARE: Admit to inpatient   PATIENT DISPOSITION:  PACU - guarded condition.   Delay start of Pharmacological VTE agent (>24hrs) due to surgical blood loss or risk of bleeding: no

## 2016-01-12 NOTE — Progress Notes (Signed)
CRITICAL VALUE ALERT  Critical value received:  Lactic Acid 2.2  Date of notification:  01/12/2016  Time of notification:  0945  Critical value read back:Yes.    Nurse who received alert:  Judd Gaudier, RN   MD notified (1st page):  Waymon Amato, MD  Time of first page:  [phone]  MD notified (2nd page):  Time of second page:  Responding MD:  Waymon Amato, MD  Time MD responded:  1045

## 2016-01-12 NOTE — Transfer of Care (Signed)
Immediate Anesthesia Transfer of Care Note  Patient: Cameron Zavala  Procedure(s) Performed: Procedure(s): IRRIGATION AND DEBRIDEMENT LOWER BACK AND RIGHT BUTTOCK (Right)  Patient Location: PACU  Anesthesia Type:General  Level of Consciousness: awake, alert  and patient cooperative  Airway & Oxygen Therapy: Patient Spontanous Breathing and Patient connected to face mask oxygen  Post-op Assessment: Report given to RN and Post -op Vital signs reviewed and stable  Post vital signs: Reviewed and stable  Last Vitals:  Filed Vitals:   01/12/16 0800 01/12/16 0852  BP: 143/94   Pulse:    Temp:    Resp: 20 32    Complications: No apparent anesthesia complications

## 2016-01-12 NOTE — Progress Notes (Signed)
Pharmacy Antibiotic Note  Cameron Zavala is a 40 y.o. male admitted on 01/11/2016 with a chronic sacral pressure ulcer and wound vac in place, sent from the wound center with fever and tachycardia. Lactic acid is elevated. Code sepsis is activated. Pharmacy has been consulted for Vanc and Zosyn dosing.   Today, 01/12/2016:  S/p debridement on 4/19 for sacral decubitus w/ necrotizing fasciitis of buttock posterior thigh and right posterior back.  SCr improved to 1.94, CrCl ~47 CG Hypotension improved LA remains elevated but improved 10.24 > 4.7; PCT: 38.7 > 40.5  Plan:  Continue Zosyn 3.375g IV Q8H infused over 4hrs.   Increase to Vancomycin 750 mg IV q12h.  Measure Vanc trough at steady state.  Follow up renal fxn, culture results, and clinical course.   Height: 5\' 4"  (162.6 cm) Weight: 166 lb 3.6 oz (75.4 kg) IBW/kg (Calculated) : 59.2  Temp (24hrs), Avg:99.3 F (37.4 C), Min:98.8 F (37.1 C), Max:100.5 F (38.1 C)   Recent Labs Lab 01/11/16 1353 01/11/16 1409 01/11/16 1915 01/12/16 0317  WBC 6.4  --   --  27.5*  CREATININE 2.35*  --  2.19* 1.94*  LATICACIDVEN  --  10.24* 4.7*  --     Estimated Creatinine Clearance: 47.5 mL/min (by C-G formula based on Cr of 1.94).    Allergies  Allergen Reactions  . Baclofen     Itching   . Carbamazepine     Rash and hives  . Sulfonamide Derivatives   . Tape Itching    Plastic tape.  Can only use paper tape.  . Statins Itching and Rash    Antimicrobials this admission: Zosyn 4/19 >>  Vanc 4/19 >>   Dose adjustments this admission: 4/20 Vancomycin dose increased for improved renal function  Microbiology results: 4/19 BCx: 1/2 GPC chains (anaerobic bottle only) 4/19 UCx: sent 4/19 MRSA: negative 4/19 wound cx: sent  Thank you for allowing pharmacy to be a part of this patient's care.  Lynann Beaver PharmD, BCPS Pager [phone] 01/12/2016 8:18 AM

## 2016-01-12 NOTE — Anesthesia Procedure Notes (Signed)
Procedure Name: Intubation Date/Time: 01/12/2016 10:33 AM Performed by: Delphia Grates Pre-anesthesia Checklist: Emergency Drugs available, Suction available, Patient being monitored and Patient identified Patient Re-evaluated:Patient Re-evaluated prior to inductionOxygen Delivery Method: Circle system utilized Preoxygenation: Pre-oxygenation with 100% oxygen Intubation Type: IV induction Laryngoscope Size: Mac and 4 Grade View: Grade I Tube type: Oral Number of attempts: 1 Airway Equipment and Method: Stylet Placement Confirmation: ETT inserted through vocal cords under direct vision,  breath sounds checked- equal and bilateral and positive ETCO2 Secured at: 22 cm Tube secured with: Tape Dental Injury: Teeth and Oropharynx as per pre-operative assessment  Comments: Intubated without muscle relaxant.
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
## 2016-01-12 NOTE — Consult Note (Signed)
WOC ostomy consult note Requested to preoperatively mark for possible diverting ostomy; WOC nurse began day at another campus and was not able to see patient prior to operative procedure. WOC nursing team will not follow, but will remain available to this patient, the nursing, surgical and medical teams in the event an ostomy is created intraoperatively.  Please re-consult if needed. Thanks, Ladona Mow, MSN, RN, GNP, [facility]  Pager# [phone]

## 2016-01-12 NOTE — Clinical Documentation Improvement (Signed)
General Surgery Internal Medicine  Can the diagnosis of pressure ulcer prior to debridement be further specified by stage ?  Thank you   Pressure Ulcer Stage - Stage1, Stage 2, Stage 3, Stage 4, Unstageable, Unspecified, Unable to Clinically Determine  Other  Clinically Undetermined    Supporting Information: Admitted with infected sacral ulcer  Treatment: surgical debridement  Please exercise your independent, professional judgment when responding. A specific answer is not anticipated or expected.   Thank You,  Lavonda Jumbo Health Information Management  [phone]

## 2016-01-12 NOTE — Anesthesia Postprocedure Evaluation (Signed)
Anesthesia Post Note  Patient: Cameron Zavala  Procedure(s) Performed: Procedure(s) (LRB): IRRIGATION AND DEBRIDEMENT LOWER BACK AND RIGHT BUTTOCK (Right)  Patient location during evaluation: PACU Anesthesia Type: General Level of consciousness: sedated Pain management: satisfactory to patient Vital Signs Assessment: post-procedure vital signs reviewed and stable Respiratory status: spontaneous breathing Cardiovascular status: stable Anesthetic complications: no    Last Vitals:  Filed Vitals:   01/12/16 1145 01/12/16 1200  BP: 127/72 121/77  Pulse: 96 91  Temp: 36.7 C   Resp: 19 18    Last Pain:  Filed Vitals:   01/12/16 1203  PainSc: 0-No pain                 Eshawn Coor EDWARD

## 2016-01-12 NOTE — Progress Notes (Signed)
Patient ID: Cameron Zavala, male   DOB: 07-27-1976, 40 y.o.   MRN: [id number] Risks and benefits of surgery discussed with patient and he understands and wishes to proceed
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
## 2016-01-12 NOTE — Progress Notes (Signed)
CRITICAL VALUE ALERT  Critical value received:  GRAM POSITIVE COCCI IN CHAINS  AEROBIC BOTTLE   Date of notification:  01/12/2016  Time of notification:  1037  Critical value read back:Yes.    Nurse who received alert:  Judd Gaudier, RN  MD notified (1st page):  [place],MD  Time of first page:  1041  MD notified (2nd page):  Time of second page:  Responding MD:  Waymon Amato, MD  Time MD responded:  1048

## 2016-01-12 NOTE — Progress Notes (Signed)
CRITICAL VALUE ALERT  Critical value received:  Gram positive cocci in chains in anaerobic bottle   Date of notification:  01/12/2016  Time of notification:  0725  Critical value read back:Yes.    Nurse who received alert:  Heloise Purpura, RN  MD notified (1st page):  Waymon Amato, MD  Time of first page:  [phone]  MD notified (2nd page):  Time of second page:  Responding MD:  Waymon Amato, MD  Time MD responded:  [phone]

## 2016-01-12 NOTE — Consult Note (Signed)
WOC ostomy consult note Conversation with bedside RN who indicates that this patient is to be care for at another facility if arrangements can be made due to the complexity of the surgical intervention required.   WOC nursing team will not follow, but will remain available to this patient, the nursing and medical teams.  Please re-consult if needed. Thanks, Ladona Mow, MSN, RN, GNP, [facility]  Pager# [phone]
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
## 2016-01-13 ENCOUNTER — Encounter (HOSPITAL_COMMUNITY): Payer: Self-pay | Admitting: General Surgery

## 2016-01-13 LAB — URINE CULTURE: Culture: NO GROWTH

## 2016-01-13 LAB — GLUCOSE, CAPILLARY: Glucose-Capillary: 150 mg/dL — ABNORMAL HIGH (ref 65–99)

## 2016-01-13 NOTE — Progress Notes (Signed)
Patient left the unit at 0039 via [facility].

## 2016-01-13 NOTE — Progress Notes (Signed)
CRITICAL VALUE ALERT  Critical value received:  +ve blood culture, Anaerobic bottle, gram +ve rods  Date of notification: 01/12/16  Time of notification: 2318  Critical value read back:Yes.    Nurse who received alert:  Reuel Boom RN  MD notified (1st page):  Maren Reamer, NP  Time of first page:  1234  MD notified (2nd page):   Time of second page:  Responding MD:  Maren Reamer  Time MD responded:  Awaiting response

## 2016-01-13 NOTE — Progress Notes (Signed)
Report called to Tempie Hoist RN of [facility] [phone]). Patient to be transported to room 828, cancer center on 8th floor. Patient's wife Luisa Dago) also notified of the transfer.

## 2016-01-15 LAB — CULTURE, BLOOD (ROUTINE X 2)

## 2016-01-16 ENCOUNTER — Ambulatory Visit: Payer: No Typology Code available for payment source

## 2016-01-16 LAB — TYPE AND SCREEN
ABO/RH(D): O POS
ANTIBODY SCREEN: NEGATIVE
UNIT DIVISION: 0
Unit division: 0

## 2016-01-16 LAB — WOUND CULTURE

## 2016-01-17 LAB — ANAEROBIC CULTURE

## 2016-01-17 LAB — TISSUE CULTURE: GRAM STAIN: NONE SEEN

## 2016-02-29 ENCOUNTER — Telehealth: Payer: Self-pay | Admitting: Internal Medicine

## 2016-02-29 NOTE — Telephone Encounter (Signed)
i do not understand the med list, looks like pt gets iv pain med Please advise

## 2016-02-29 NOTE — Telephone Encounter (Signed)
I concur. Where is he receiving this medication? It is unclear if he is at a skilled nursing facility now.

## 2016-02-29 NOTE — Telephone Encounter (Signed)
Needs pain med refill °

## 2016-03-01 ENCOUNTER — Telehealth: Payer: Self-pay | Admitting: *Deleted

## 2016-03-01 NOTE — Telephone Encounter (Signed)
Spoke w/ pt's wife, she states no he does not need iv pain med but does need something, he also needs wound care but they have not been happy with care provided so far, when he was at [facility] he got really good care but traveling there for wound care will be hard, appt made for pt, also will send to [facility]. For sw assist

## 2016-03-02 NOTE — Telephone Encounter (Signed)
CSW placed call to Pt, utilizing telephonic interpreter.  Spouse states she speaks [place] and interpreter was not needed.  CSW returned call to spouse without interpreter.  CSW placed call to follow up on pt's wound care services and which services spouse was not happy with receiving.  Cameron Zavala is currently at home receiving home health wound care/wound vac through [facility].  HH RN comes 2x week for wound care and colostomy care.   Pt was recently discharged from [facility] and set up to return to [facility] for outpatient wound care.  However, pt does not have transportation out of [place].  Cameron Zavala utilizes [facility], which services the city of [place] only.  CSW discussed option of utilizing [facility] transportation, spouse states this is not an option due to her limitations and scheduling.   Pt was initially referred to [facility] utilizing [facility] and [facility] transportation.  Spouse voiced concern regarding pt's care he received while outpatient with [facility].  Cameron Zavala inquired if pt could be referred to another wound care physician at [facility] other than Dr. Meyer Russel or another wound care specialist that accepts the [facility] card. Ms. Cameron Zavala clarified that she is very please with Dr. Allena Katz and current services at [facility].  Spouse aware this request will be forwarded to PCP.

## 2016-03-04 NOTE — Telephone Encounter (Signed)
As far as I know, I do not know of any wound care clinic that take the [facility] discount card. Maybe we can see if he can be seen by another MD at the clinic?  I will copy Ms. Mamie.

## 2016-03-05 ENCOUNTER — Other Ambulatory Visit: Payer: Self-pay | Admitting: Internal Medicine

## 2016-03-05 NOTE — Telephone Encounter (Signed)
Refill for naprasin (thinks that is the name of the med) [facility] [address]

## 2016-03-07 NOTE — Telephone Encounter (Signed)
Can you assess why he is asking for this medication? I'm assuming pain control but just want to verify.

## 2016-03-09 ENCOUNTER — Other Ambulatory Visit: Payer: Self-pay | Admitting: Internal Medicine

## 2016-03-09 ENCOUNTER — Telehealth: Payer: Self-pay

## 2016-03-09 NOTE — Telephone Encounter (Signed)
I am okay with this. I also recommend he follow-up in clinic as he has been requesting naproxen for his pain and just want to make sure we are treating appropriately.

## 2016-03-09 NOTE — Telephone Encounter (Signed)
HH calls for verb order for a condom cath, pt is incont of urine and is sitting in urine all the time, verbal given, are you ok with this?

## 2016-03-09 NOTE — Telephone Encounter (Signed)
Tracy from [facility] requesting VO. Please call back.  

## 2016-03-12 ENCOUNTER — Telehealth: Payer: Self-pay

## 2016-03-12 NOTE — Telephone Encounter (Signed)
Pt wife requesting naproxen to be filled. Please call pt back.

## 2016-03-20 ENCOUNTER — Telehealth: Payer: Self-pay | Admitting: Internal Medicine

## 2016-03-20 NOTE — Telephone Encounter (Signed)
Calling nurse back from yesterday

## 2016-03-23 NOTE — Telephone Encounter (Signed)
Called last evening and today, lm for rtc

## 2016-03-28 NOTE — Telephone Encounter (Signed)
Cameron Zavala from [facility] needs to speak with a nurse regarding wound care. Please call back.

## 2016-03-28 NOTE — Telephone Encounter (Signed)
LVM to return call.

## 2016-03-29 NOTE — Telephone Encounter (Signed)
Spoke with Bethann Berkshire, she said that she would find out who initiated the order and contact them to discontinue. She is going to fax [place] the results for pt. Follow up appointment. Also said to pass on that it was okay to remove wound vac at follow-up and she would meet the patient at home afterwards to reapply if necessary.

## 2016-03-29 NOTE — Telephone Encounter (Signed)
I am not sure who ordered the weekly CBC and who they are sending them to. I am guessing this was ordered from the surgeons at [facility]? Or the [facility] wound center? We have not been receiving any of the results here in our clinic. I even check Dr. Eliane Decree box and there are no results in there either. We can certainly check his blood at his upcoming clinic visit, but I would advise the [facility] to double check which clinic is receiving these results, and if it is someone else to ask them how much longer the weekly CBC checks should continue.

## 2016-03-29 NOTE — Telephone Encounter (Signed)
Bethann Berkshire, [facility] RN states that wound looks well and she is currently doing [employer] wound vac changes on Monday and Thursday. She is also collecting a weekly CBC with diff. On Thursday's and wants to know if she needs to continue to draw this. Patient has an appointment in the clinic on 04/06/16 for follow-up. Please advise. Thanks!

## 2016-04-05 ENCOUNTER — Encounter (HOSPITAL_COMMUNITY): Payer: Self-pay | Admitting: Emergency Medicine

## 2016-04-05 ENCOUNTER — Inpatient Hospital Stay (HOSPITAL_COMMUNITY)
Admission: EM | Admit: 2016-04-05 | Discharge: 2016-04-26 | DRG: 540 | Disposition: A | Discharge status: Other Acute Inpatient Hospital | Payer: Medicaid Other | Attending: Internal Medicine | Admitting: Internal Medicine

## 2016-04-05 ENCOUNTER — Telehealth: Payer: Self-pay

## 2016-04-05 ENCOUNTER — Emergency Department (HOSPITAL_COMMUNITY): Payer: Medicaid Other

## 2016-04-05 DIAGNOSIS — L89319 Pressure ulcer of right buttock, unspecified stage: Secondary | ICD-10-CM | POA: Diagnosis present

## 2016-04-05 DIAGNOSIS — Z933 Colostomy status: Secondary | ICD-10-CM

## 2016-04-05 DIAGNOSIS — R7303 Prediabetes: Secondary | ICD-10-CM | POA: Diagnosis present

## 2016-04-05 DIAGNOSIS — I10 Essential (primary) hypertension: Secondary | ICD-10-CM | POA: Diagnosis present

## 2016-04-05 DIAGNOSIS — L02415 Cutaneous abscess of right lower limb: Secondary | ICD-10-CM | POA: Diagnosis present

## 2016-04-05 DIAGNOSIS — F329 Major depressive disorder, single episode, unspecified: Secondary | ICD-10-CM | POA: Diagnosis present

## 2016-04-05 DIAGNOSIS — F17200 Nicotine dependence, unspecified, uncomplicated: Secondary | ICD-10-CM | POA: Diagnosis present

## 2016-04-05 DIAGNOSIS — M869 Osteomyelitis, unspecified: Principal | ICD-10-CM | POA: Diagnosis present

## 2016-04-05 DIAGNOSIS — E871 Hypo-osmolality and hyponatremia: Secondary | ICD-10-CM | POA: Diagnosis present

## 2016-04-05 DIAGNOSIS — Z8739 Personal history of other diseases of the musculoskeletal system and connective tissue: Secondary | ICD-10-CM

## 2016-04-05 DIAGNOSIS — N320 Bladder-neck obstruction: Secondary | ICD-10-CM | POA: Diagnosis present

## 2016-04-05 DIAGNOSIS — E785 Hyperlipidemia, unspecified: Secondary | ICD-10-CM | POA: Diagnosis present

## 2016-04-05 DIAGNOSIS — N3289 Other specified disorders of bladder: Secondary | ICD-10-CM | POA: Diagnosis present

## 2016-04-05 DIAGNOSIS — Z7401 Bed confinement status: Secondary | ICD-10-CM

## 2016-04-05 DIAGNOSIS — L89150 Pressure ulcer of sacral region, unstageable: Secondary | ICD-10-CM | POA: Diagnosis present

## 2016-04-05 DIAGNOSIS — M84451A Pathological fracture, right femur, initial encounter for fracture: Secondary | ICD-10-CM | POA: Diagnosis present

## 2016-04-05 DIAGNOSIS — L309 Dermatitis, unspecified: Secondary | ICD-10-CM | POA: Diagnosis present

## 2016-04-05 DIAGNOSIS — D649 Anemia, unspecified: Secondary | ICD-10-CM | POA: Diagnosis present

## 2016-04-05 DIAGNOSIS — G8220 Paraplegia, unspecified: Secondary | ICD-10-CM | POA: Diagnosis present

## 2016-04-05 DIAGNOSIS — R509 Fever, unspecified: Secondary | ICD-10-CM | POA: Diagnosis present

## 2016-04-05 DIAGNOSIS — N39498 Other specified urinary incontinence: Secondary | ICD-10-CM | POA: Diagnosis present

## 2016-04-05 DIAGNOSIS — L89222 Pressure ulcer of left hip, stage 2: Secondary | ICD-10-CM | POA: Diagnosis present

## 2016-04-05 DIAGNOSIS — R6510 Systemic inflammatory response syndrome (SIRS) of non-infectious origin without acute organ dysfunction: Secondary | ICD-10-CM

## 2016-04-05 DIAGNOSIS — K219 Gastro-esophageal reflux disease without esophagitis: Secondary | ICD-10-CM | POA: Diagnosis present

## 2016-04-05 LAB — COMPREHENSIVE METABOLIC PANEL
ALBUMIN: 2.9 g/dL — AB (ref 3.5–5.0)
ALK PHOS: 78 U/L (ref 38–126)
ALT: 14 U/L — AB (ref 17–63)
AST: 19 U/L (ref 15–41)
Anion gap: 9 (ref 5–15)
BILIRUBIN TOTAL: 0.9 mg/dL (ref 0.3–1.2)
BUN: 17 mg/dL (ref 6–20)
CALCIUM: 8.8 mg/dL — AB (ref 8.9–10.3)
CO2: 23 mmol/L (ref 22–32)
Chloride: 100 mmol/L — ABNORMAL LOW (ref 101–111)
Creatinine, Ser: 0.87 mg/dL (ref 0.61–1.24)
GFR calc Af Amer: >60 mL/min (ref >60)
GFR calc non Af Amer: >60 mL/min (ref >60)
GLUCOSE: 138 mg/dL — AB (ref 65–99)
Potassium: 3.6 mmol/L (ref 3.5–5.1)
SODIUM: 132 mmol/L — AB (ref 135–145)
TOTAL PROTEIN: 7.3 g/dL (ref 6.5–8.1)

## 2016-04-05 LAB — CBC WITH DIFFERENTIAL/PLATELET
BASOS PCT: 0 %
Basophils Absolute: 0 10*3/uL (ref 0.0–0.1)
EOS ABS: 0 10*3/uL (ref 0.0–0.7)
Eosinophils Relative: 0 %
HEMATOCRIT: 35.3 % — AB (ref 39.0–52.0)
HEMOGLOBIN: 11.1 g/dL — AB (ref 13.0–17.0)
LYMPHS ABS: 1.5 10*3/uL (ref 0.7–4.0)
Lymphocytes Relative: 14 %
MCH: 24.8 pg — AB (ref 26.0–34.0)
MCHC: 31.4 g/dL (ref 30.0–36.0)
MCV: 78.8 fL (ref 78.0–100.0)
MONO ABS: 0.8 10*3/uL (ref 0.1–1.0)
MONOS PCT: 8 %
Neutro Abs: 8 10*3/uL — ABNORMAL HIGH (ref 1.7–7.7)
Neutrophils Relative %: 78 %
Platelets: 279 10*3/uL (ref 150–400)
RBC: 4.48 MIL/uL (ref 4.22–5.81)
RDW: 16.6 % — AB (ref 11.5–15.5)
WBC: 10.3 10*3/uL (ref 4.0–10.5)

## 2016-04-05 LAB — URINALYSIS, ROUTINE W REFLEX MICROSCOPIC
BILIRUBIN URINE: NEGATIVE
Glucose, UA: NEGATIVE mg/dL
HGB URINE DIPSTICK: NEGATIVE
KETONES UR: NEGATIVE mg/dL
Leukocytes, UA: NEGATIVE
NITRITE: NEGATIVE
PH: 6 (ref 5.0–8.0)
Protein, ur: NEGATIVE mg/dL
SPECIFIC GRAVITY, URINE: 1.019 (ref 1.005–1.030)

## 2016-04-05 LAB — I-STAT CG4 LACTIC ACID, ED: Lactic Acid, Venous: 1.42 mmol/L (ref 0.5–1.9)

## 2016-04-05 MED ORDER — HYDROCHLOROTHIAZIDE 25 MG PO TABS
25.0000 mg | ORAL_TABLET | Freq: Every day | ORAL | Status: DC
  Administered 2016-04-06 – 2016-04-16 (×11): 25 mg via ORAL
  Filled 2016-04-05 (×11): qty 1

## 2016-04-05 MED ORDER — ACETAMINOPHEN 500 MG PO TABS
500.0000 mg | ORAL_TABLET | Freq: Every evening | ORAL | Status: DC | PRN
  Administered 2016-04-06 – 2016-04-08 (×4): 500 mg via ORAL
  Filled 2016-04-05 (×4): qty 1

## 2016-04-05 MED ORDER — DOCUSATE SODIUM 100 MG PO CAPS
100.0000 mg | ORAL_CAPSULE | Freq: Every day | ORAL | Status: DC
  Administered 2016-04-06 – 2016-04-26 (×20): 100 mg via ORAL
  Filled 2016-04-05 (×21): qty 1

## 2016-04-05 MED ORDER — LISINOPRIL 20 MG PO TABS
20.0000 mg | ORAL_TABLET | Freq: Every day | ORAL | Status: DC
  Administered 2016-04-06 – 2016-04-16 (×11): 20 mg via ORAL
  Filled 2016-04-05 (×11): qty 1

## 2016-04-05 MED ORDER — TRAMADOL HCL 50 MG PO TABS
50.0000 mg | ORAL_TABLET | Freq: Every day | ORAL | Status: DC | PRN
  Administered 2016-04-09 – 2016-04-25 (×13): 50 mg via ORAL
  Filled 2016-04-05 (×17): qty 1

## 2016-04-05 MED ORDER — ENSURE ENLIVE PO LIQD
237.0000 mL | Freq: Three times a day (TID) | ORAL | Status: DC
  Administered 2016-04-05 – 2016-04-26 (×50): 237 mL via ORAL

## 2016-04-05 MED ORDER — VITAMIN C 500 MG PO TABS
1000.0000 mg | ORAL_TABLET | Freq: Every morning | ORAL | Status: DC
  Administered 2016-04-06 – 2016-04-26 (×21): 1000 mg via ORAL
  Filled 2016-04-05 (×22): qty 2

## 2016-04-05 MED ORDER — SODIUM CHLORIDE 0.9 % IV BOLUS (SEPSIS)
1000.0000 mL | Freq: Once | INTRAVENOUS | Status: AC
  Administered 2016-04-05: 1000 mL via INTRAVENOUS

## 2016-04-05 MED ORDER — VITAMIN D 1000 UNITS PO TABS
1000.0000 [IU] | ORAL_TABLET | Freq: Every day | ORAL | Status: DC
  Administered 2016-04-06 – 2016-04-26 (×21): 1000 [IU] via ORAL
  Filled 2016-04-05 (×21): qty 1

## 2016-04-05 MED ORDER — ACETAMINOPHEN 325 MG PO TABS
650.0000 mg | ORAL_TABLET | Freq: Once | ORAL | Status: AC
  Administered 2016-04-05: 650 mg via ORAL
  Filled 2016-04-05: qty 2

## 2016-04-05 MED ORDER — NAPROXEN 250 MG PO TABS
500.0000 mg | ORAL_TABLET | Freq: Two times a day (BID) | ORAL | Status: DC | PRN
  Administered 2016-04-12 – 2016-04-20 (×3): 500 mg via ORAL
  Filled 2016-04-05 (×3): qty 2

## 2016-04-05 MED ORDER — SODIUM CHLORIDE 0.9 % IV SOLN
INTRAVENOUS | Status: AC
  Administered 2016-04-05: 19:00:00 via INTRAVENOUS

## 2016-04-05 MED ORDER — VANCOMYCIN HCL IN DEXTROSE 750-5 MG/150ML-% IV SOLN
750.0000 mg | Freq: Two times a day (BID) | INTRAVENOUS | Status: DC
  Administered 2016-04-05: 750 mg via INTRAVENOUS
  Filled 2016-04-05 (×2): qty 150

## 2016-04-05 MED ORDER — LISINOPRIL-HYDROCHLOROTHIAZIDE 20-25 MG PO TABS: 1.0000 | ORAL_TABLET | ORAL | Status: DC

## 2016-04-05 MED ORDER — CITALOPRAM HYDROBROMIDE 10 MG PO TABS
20.0000 mg | ORAL_TABLET | Freq: Every morning | ORAL | Status: DC
  Administered 2016-04-06 – 2016-04-26 (×21): 20 mg via ORAL
  Filled 2016-04-05 (×21): qty 2

## 2016-04-05 MED ORDER — ENOXAPARIN SODIUM 40 MG/0.4ML ~~LOC~~ SOLN
40.0000 mg | SUBCUTANEOUS | Status: DC
  Administered 2016-04-05 – 2016-04-26 (×22): 40 mg via SUBCUTANEOUS
  Filled 2016-04-05 (×22): qty 0.4

## 2016-04-05 MED ORDER — PIPERACILLIN-TAZOBACTAM 3.375 G IVPB
3.3750 g | Freq: Three times a day (TID) | INTRAVENOUS | Status: DC
  Administered 2016-04-05: 3.375 g via INTRAVENOUS
  Filled 2016-04-05: qty 50

## 2016-04-05 NOTE — Telephone Encounter (Signed)
Spoke with from [facility] who was in the home this afternoon for wound care. She reports patient to be febrile with TMAX of 101.7 and pulse of 116. Due to current vitals, HX of paraplegia and history of sepsis I recommended that she have the patient go to the ED. French Ana said she was one block from the home and was going right now to tell the wife to take him to the ED or call EMS. Reports that she drew the weekly CBC that was ordered from the surgeon and was running it STAT, but would fax the results here.   FYI

## 2016-04-05 NOTE — Progress Notes (Signed)
Patient admitted to the unit from ED. Patient alert and oriented x 4. Patient oriented to room and made comfortable.

## 2016-04-05 NOTE — Telephone Encounter (Signed)
Cameron Zavala from [facility] requesting to speak with a nurse regarding pt.

## 2016-04-05 NOTE — Telephone Encounter (Signed)
I agree with your assessment. Thank you for letting me know.

## 2016-04-05 NOTE — ED Notes (Signed)
Accidentally clicked off labs for the wrong time. CBC CMP collected at 1438 along with 1st set of blood cultures.

## 2016-04-05 NOTE — ED Notes (Addendum)
Ordered colostomy bag [place]  [phone] from central supply.

## 2016-04-05 NOTE — ED Notes (Addendum)
Per EMS- pt here from home. Pt has hx of sacral wound since March with a wound vac placement in April. Last night pt had one episode of nausea without pain, pt then had fever chills. Pt currently has no complaints besides fever, 103 oral. Pt is paraplegic.

## 2016-04-05 NOTE — Progress Notes (Signed)
Pharmacy Antibiotic Note  Rilen Jerauld is a 40 y.o. male admitted on 04/05/2016 with sepsis.  Pharmacy has been consulted for Vancomycin and Zosyn dosing.  History of sacral wound since March with wound VAC placement in April PMH: paraplegia, HTN, depression, GERD  Plan: Zosyn 3.375g IV q8h (4 hour infusion).  Vancomycin 750 mg iv Q 12 hours  Height: 5\' 4"  (162.6 cm) Weight: 125 lb (56.7 kg) IBW/kg (Calculated) : 59.2  Temp (24hrs), Avg:101 F (38.3 C), Min:98.8 F (37.1 C), Max:103.1 F (39.5 C)   Recent Labs Lab 04/05/16 1438 04/05/16 1451  WBC 10.3  --   CREATININE 0.87  --   LATICACIDVEN  --  1.42    Estimated Creatinine Clearance: 90.5 mL/min (by C-G formula based on Cr of 0.87).    Allergies  Allergen Reactions  . Baclofen     Itching   . Carbamazepine     Rash and hives  . Sulfonamide Derivatives   . Tape Itching    Plastic tape.  Can only use paper tape.  . Statins Itching and Rash     Thank you for allowing pharmacy to be a part of this patient's care. Okey Regal, PharmD [phone] 04/05/2016 4:01 PM

## 2016-04-05 NOTE — ED Notes (Signed)
MD at bedside evaluating the patient.
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
## 2016-04-05 NOTE — ED Notes (Signed)
Patient transported to X-ray 

## 2016-04-05 NOTE — Progress Notes (Signed)
Patient has a wound vac from home.

## 2016-04-06 ENCOUNTER — Encounter (HOSPITAL_COMMUNITY): Payer: Self-pay | Admitting: Radiology

## 2016-04-06 ENCOUNTER — Encounter: Payer: No Typology Code available for payment source | Admitting: Internal Medicine

## 2016-04-06 ENCOUNTER — Inpatient Hospital Stay (HOSPITAL_COMMUNITY): Payer: Medicaid Other

## 2016-04-06 ENCOUNTER — Observation Stay (HOSPITAL_COMMUNITY): Payer: Medicaid Other

## 2016-04-06 DIAGNOSIS — N3289 Other specified disorders of bladder: Secondary | ICD-10-CM | POA: Diagnosis present

## 2016-04-06 DIAGNOSIS — L89222 Pressure ulcer of left hip, stage 2: Secondary | ICD-10-CM | POA: Diagnosis present

## 2016-04-06 DIAGNOSIS — Z7401 Bed confinement status: Secondary | ICD-10-CM | POA: Diagnosis not present

## 2016-04-06 DIAGNOSIS — L02415 Cutaneous abscess of right lower limb: Secondary | ICD-10-CM | POA: Diagnosis present

## 2016-04-06 DIAGNOSIS — Z933 Colostomy status: Secondary | ICD-10-CM

## 2016-04-06 DIAGNOSIS — I10 Essential (primary) hypertension: Secondary | ICD-10-CM

## 2016-04-06 DIAGNOSIS — N39498 Other specified urinary incontinence: Secondary | ICD-10-CM | POA: Diagnosis present

## 2016-04-06 DIAGNOSIS — M84451A Pathological fracture, right femur, initial encounter for fracture: Secondary | ICD-10-CM | POA: Diagnosis present

## 2016-04-06 DIAGNOSIS — M868X8 Other osteomyelitis, other site: Secondary | ICD-10-CM

## 2016-04-06 DIAGNOSIS — L89319 Pressure ulcer of right buttock, unspecified stage: Secondary | ICD-10-CM | POA: Diagnosis present

## 2016-04-06 DIAGNOSIS — K219 Gastro-esophageal reflux disease without esophagitis: Secondary | ICD-10-CM | POA: Diagnosis present

## 2016-04-06 DIAGNOSIS — R7303 Prediabetes: Secondary | ICD-10-CM | POA: Diagnosis present

## 2016-04-06 DIAGNOSIS — D649 Anemia, unspecified: Secondary | ICD-10-CM | POA: Diagnosis present

## 2016-04-06 DIAGNOSIS — E871 Hypo-osmolality and hyponatremia: Secondary | ICD-10-CM | POA: Diagnosis present

## 2016-04-06 DIAGNOSIS — L89152 Pressure ulcer of sacral region, stage 2: Secondary | ICD-10-CM

## 2016-04-06 DIAGNOSIS — R509 Fever, unspecified: Secondary | ICD-10-CM | POA: Diagnosis present

## 2016-04-06 DIAGNOSIS — F329 Major depressive disorder, single episode, unspecified: Secondary | ICD-10-CM | POA: Diagnosis present

## 2016-04-06 DIAGNOSIS — G8220 Paraplegia, unspecified: Secondary | ICD-10-CM | POA: Diagnosis present

## 2016-04-06 DIAGNOSIS — L89150 Pressure ulcer of sacral region, unstageable: Secondary | ICD-10-CM | POA: Diagnosis present

## 2016-04-06 DIAGNOSIS — L309 Dermatitis, unspecified: Secondary | ICD-10-CM | POA: Diagnosis present

## 2016-04-06 DIAGNOSIS — E785 Hyperlipidemia, unspecified: Secondary | ICD-10-CM | POA: Diagnosis present

## 2016-04-06 DIAGNOSIS — N320 Bladder-neck obstruction: Secondary | ICD-10-CM | POA: Diagnosis present

## 2016-04-06 DIAGNOSIS — F17200 Nicotine dependence, unspecified, uncomplicated: Secondary | ICD-10-CM | POA: Diagnosis present

## 2016-04-06 DIAGNOSIS — M869 Osteomyelitis, unspecified: Secondary | ICD-10-CM | POA: Diagnosis present

## 2016-04-06 DIAGNOSIS — R32 Unspecified urinary incontinence: Secondary | ICD-10-CM

## 2016-04-06 LAB — CBC
HCT: 29.4 % — ABNORMAL LOW (ref 39.0–52.0)
Hemoglobin: 9.3 g/dL — ABNORMAL LOW (ref 13.0–17.0)
MCH: 25 pg — AB (ref 26.0–34.0)
MCHC: 31.6 g/dL (ref 30.0–36.0)
MCV: 79 fL (ref 78.0–100.0)
PLATELETS: 246 10*3/uL (ref 150–400)
RBC: 3.72 MIL/uL — ABNORMAL LOW (ref 4.22–5.81)
RDW: 16.8 % — AB (ref 11.5–15.5)
WBC: 9 10*3/uL (ref 4.0–10.5)

## 2016-04-06 LAB — BASIC METABOLIC PANEL
Anion gap: 10 (ref 5–15)
BUN: 11 mg/dL (ref 6–20)
CALCIUM: 8.3 mg/dL — AB (ref 8.9–10.3)
CO2: 24 mmol/L (ref 22–32)
Chloride: 100 mmol/L — ABNORMAL LOW (ref 101–111)
Creatinine, Ser: 0.76 mg/dL (ref 0.61–1.24)
GFR calc Af Amer: >60 mL/min (ref >60)
GLUCOSE: 189 mg/dL — AB (ref 65–99)
Potassium: 3.4 mmol/L — ABNORMAL LOW (ref 3.5–5.1)
Sodium: 134 mmol/L — ABNORMAL LOW (ref 135–145)

## 2016-04-06 LAB — URINE CULTURE: Culture: NO GROWTH

## 2016-04-06 LAB — LACTIC ACID, PLASMA
Lactic Acid, Venous: 1.1 mmol/L (ref 0.5–1.9)
Lactic Acid, Venous: 0.8 mmol/L (ref 0.5–1.9)

## 2016-04-06 MED ORDER — PIPERACILLIN-TAZOBACTAM 3.375 G IVPB
3.3750 g | Freq: Three times a day (TID) | INTRAVENOUS | Status: DC
  Administered 2016-04-06 (×2): 3.375 g via INTRAVENOUS
  Filled 2016-04-06 (×4): qty 50

## 2016-04-06 MED ORDER — PIPERACILLIN-TAZOBACTAM 3.375 G IVPB: 3.3750 g | Freq: Four times a day (QID) | INTRAVENOUS | Status: DC

## 2016-04-06 MED ORDER — VANCOMYCIN HCL IN DEXTROSE 750-5 MG/150ML-% IV SOLN
750.0000 mg | Freq: Two times a day (BID) | INTRAVENOUS | Status: DC
  Administered 2016-04-06 – 2016-04-07 (×3): 750 mg via INTRAVENOUS
  Filled 2016-04-06 (×5): qty 150

## 2016-04-06 MED ORDER — PIPERACILLIN-TAZOBACTAM 3.375 G IVPB 30 MIN
3.3750 g | Freq: Once | INTRAVENOUS | Status: AC
  Administered 2016-04-06: 3.375 g via INTRAVENOUS
  Filled 2016-04-06: qty 50

## 2016-04-06 MED ORDER — IOPAMIDOL (ISOVUE-300) INJECTION 61%
INTRAVENOUS | Status: AC
  Administered 2016-04-06: 100 mL
  Filled 2016-04-06: qty 100

## 2016-04-06 MED ORDER — PIPERACILLIN-TAZOBACTAM 3.375 G IVPB
3.3750 g | Freq: Three times a day (TID) | INTRAVENOUS | Status: DC
  Administered 2016-04-07 – 2016-04-11 (×13): 3.375 g via INTRAVENOUS
  Filled 2016-04-06 (×17): qty 50
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
## 2016-04-07 DIAGNOSIS — R509 Fever, unspecified: Secondary | ICD-10-CM | POA: Insufficient documentation

## 2016-04-07 DIAGNOSIS — L02818 Cutaneous abscess of other sites: Secondary | ICD-10-CM

## 2016-04-07 DIAGNOSIS — M868X0 Other osteomyelitis, multiple sites: Secondary | ICD-10-CM

## 2016-04-07 DIAGNOSIS — M84421A Pathological fracture, right humerus, initial encounter for fracture: Secondary | ICD-10-CM

## 2016-04-07 DIAGNOSIS — B9689 Other specified bacterial agents as the cause of diseases classified elsewhere: Secondary | ICD-10-CM

## 2016-04-07 DIAGNOSIS — M84451A Pathological fracture, right femur, initial encounter for fracture: Secondary | ICD-10-CM

## 2016-04-07 DIAGNOSIS — M869 Osteomyelitis, unspecified: Secondary | ICD-10-CM | POA: Diagnosis present

## 2016-04-07 LAB — VANCOMYCIN, TROUGH: VANCOMYCIN TR: 10 ug/mL — AB (ref 15–20)

## 2016-04-07 MED ORDER — VANCOMYCIN HCL 10 G IV SOLR
1250.0000 mg | Freq: Two times a day (BID) | INTRAVENOUS | Status: DC
  Administered 2016-04-07 – 2016-04-13 (×12): 1250 mg via INTRAVENOUS
  Filled 2016-04-07 (×15): qty 1250

## 2016-04-07 NOTE — Progress Notes (Signed)
Pharmacy Antibiotic Note  Cameron Zavala is a 40 y.o. male admitted on 04/05/2016 with sepsis.  Pharmacy has been consulted for Vancomycin and Zosyn dosing. -VT trough at 1530 today was below goal at 10. Goal vancomycin 15-20.  Vanc 7/13>> Zosyn 7/13>>  7/13 BCx x2 ngtd 7/13 UCx neg/final  Plan: Zosyn 3.375g IV q8h (4 hour infusion).  Change Vancomycin to 1250 mg iv Q 12 hours  Height: 5\' 4"  (162.6 cm) Weight: 134 lb (60.782 kg) IBW/kg (Calculated) : 59.2  Temp (24hrs), Avg:101 F (38.3 C), Min:98.2 F (36.8 C), Max:102.5 F (39.2 C)   Recent Labs Lab 04/05/16 1438 04/05/16 1451 04/06/16 0514 04/06/16 0800 04/07/16 1525  WBC 10.3  --  9.0  --   --   CREATININE 0.87  --  0.76  --   --   LATICACIDVEN  --  1.42 1.1 0.8  --   VANCOTROUGH  --   --   --   --  10*    Estimated Creatinine Clearance: 102.8 mL/min (by C-G formula based on Cr of 0.76).    Allergies  Allergen Reactions  . Baclofen Itching  . Flexeril [Cyclobenzaprine] Other (See Comments)    Extreme tiredness for 24 hours  . Potassium Nausea And Vomiting  . Silver Itching  . Zinc Swelling  . Carbamazepine Hives and Rash  . Chocolate Itching and Rash  . Flour Itching and Rash    Toast, biscuits rolls   . Meat Extract Itching and Rash    Different types of steak  . Other Itching and Rash    Vanilla pudding,  cookies (wafers with white cream in the middle), microwave meals  . Peanut-Containing Drug Products Itching and Rash    Red lips  . Pumpkin Seed Itching and Rash  . Rosuvastatin Calcium Itching and Rash  . Statins Itching and Rash  . Sulfonamide Derivatives Itching and Rash  . Sunflower Seed [Sunflower Oil] Itching and Rash  . Tape Itching and Rash    Plastic tape.  Can only use paper tape.     Thank you for allowing pharmacy to be a part of this patient's care.  Harland Bryndan, Pharm D 04/07/2016 4:15 PM

## 2016-04-07 NOTE — Progress Notes (Signed)
Subjective: No new complaints   Antibiotics:  Anti-infectives    Start     Dose/Rate Route Frequency Ordered Stop   04/07/16 1700  vancomycin (VANCOCIN) 1,250 mg in sodium chloride 0.9 % 250 mL IVPB     1,250 mg 166.7 mL/hr over 90 Minutes Intravenous Every 12 hours 04/07/16 1613     04/07/16 0300  piperacillin-tazobactam (ZOSYN) IVPB 3.375 g     3.375 g 12.5 mL/hr over 240 Minutes Intravenous Every 8 hours 04/06/16 1905     04/06/16 0800  piperacillin-tazobactam (ZOSYN) IVPB 3.375 g  Status:  Discontinued     3.375 g 12.5 mL/hr over 240 Minutes Intravenous Every 8 hours 04/06/16 0224 04/06/16 1905   04/06/16 0400  vancomycin (VANCOCIN) IVPB 750 mg/150 ml premix  Status:  Discontinued     750 mg 150 mL/hr over 60 Minutes Intravenous Every 12 hours 04/06/16 0225 04/07/16 1613   04/06/16 0230  piperacillin-tazobactam (ZOSYN) IVPB 3.375 g  Status:  Discontinued     3.375 g 12.5 mL/hr over 240 Minutes Intravenous Every 6 hours 04/06/16 0220 04/06/16 0223   04/06/16 0230  piperacillin-tazobactam (ZOSYN) IVPB 3.375 g     3.375 g 100 mL/hr over 30 Minutes Intravenous  Once 04/06/16 0224 04/06/16 0323   04/05/16 1700  vancomycin (VANCOCIN) IVPB 750 mg/150 ml premix  Status:  Discontinued     750 mg 150 mL/hr over 60 Minutes Intravenous Every 12 hours 04/05/16 1605 04/05/16 1731   04/05/16 1630  piperacillin-tazobactam (ZOSYN) IVPB 3.375 g  Status:  Discontinued     3.375 g 12.5 mL/hr over 240 Minutes Intravenous Every 8 hours 04/05/16 1605 04/05/16 1731      Medications: Scheduled Meds: . cholecalciferol  1,000 Units Oral Daily  . citalopram  20 mg Oral q morning - 10a  . docusate sodium  100 mg Oral Daily  . enoxaparin (LOVENOX) injection  40 mg Subcutaneous Q24H  . feeding supplement (ENSURE ENLIVE)  237 mL Oral TID BM  . lisinopril  20 mg Oral Daily   And  . hydrochlorothiazide  25 mg Oral Daily  . piperacillin-tazobactam (ZOSYN)  IV  3.375 g Intravenous Q8H  .  vancomycin  1,250 mg Intravenous Q12H  . vitamin C  1,000 mg Oral q morning - 10a   Continuous Infusions:  PRN Meds:.acetaminophen, naproxen, traMADol    Objective: Weight change:   Intake/Output Summary (Last 24 hours) at 04/07/16 1717 Last data filed at 04/07/16 0630  Gross per 24 hour  Intake      0 ml  Output    600 ml  Net   -600 ml   Blood pressure 136/75, pulse 102, temperature 98.2 F (36.8 C), temperature source Oral, resp. rate 18, height 5\' 4"  (1.626 m), weight 134 lb (60.782 kg), SpO2 99 %. Temp:  [98.2 F (36.8 C)-102.5 F (39.2 C)] 98.2 F (36.8 C) (07/15 1351) Pulse Rate:  [102-114] 102 (07/15 1351) Resp:  [18-20] 18 (07/15 1351) BP: (124-149)/(60-90) 136/75 mmHg (07/15 1351) SpO2:  [96 %-99 %] 99 % (07/15 1351)  Physical Exam: General: Alert and awake, oriented x3, not in any acute distress. HEENT: EOMI CVS regular rate, normal r Chest:  no wheezing, no resp distress rally Skin: bandages in place  Neuro: paraplegic  CBC:  CBC Latest Ref Rng 04/06/2016 04/05/2016 01/12/2016  WBC 4.0 - 10.5 K/uL 9.0 10.3 24.5(H)  Hemoglobin 13.0 - 17.0 g/dL 1.6(X) 11.1(L) 9.4(L)  Hematocrit 39.0 - 52.0 %  29.4(L) 35.3(L) 28.0(L)  Platelets 150 - 400 K/uL 246 279 276       BMET  Recent Labs  04/05/16 1438 04/06/16 0514  NA 132* 134*  K 3.6 3.4*  CL 100* 100*  CO2 23 24  GLUCOSE 138* 189*  BUN 17 11  CREATININE 0.87 0.76  CALCIUM 8.8* 8.3*     Liver Panel   Recent Labs  04/05/16 1438  PROT 7.3  ALBUMIN 2.9*  AST 19  ALT 14*  ALKPHOS 78  BILITOT 0.9       Sedimentation Rate No results for input(s): ESRSEDRATE in the last 72 hours. C-Reactive Protein No results for input(s): CRP in the last 72 hours.  Micro Results: Recent Results (from the past 720 hour(s))  Culture, blood (Routine x 2)     Status: None (Preliminary result)   Collection Time: 04/05/16  2:58 PM  Result Value Ref Range Status   Specimen Description BLOOD LEFT FOREARM   Final   Special Requests BOTTLES DRAWN AEROBIC AND ANAEROBIC 5CC  Final   Culture NO GROWTH < 24 HOURS  Final   Report Status PENDING  Incomplete  Culture, blood (Routine x 2)     Status: None (Preliminary result)   Collection Time: 04/05/16  3:00 PM  Result Value Ref Range Status   Specimen Description BLOOD RIGHT ANTECUBITAL  Final   Special Requests BOTTLES DRAWN AEROBIC AND ANAEROBIC 10CC  Final   Culture NO GROWTH < 24 HOURS  Final   Report Status PENDING  Incomplete  Urine culture     Status: None   Collection Time: 04/05/16  3:59 PM  Result Value Ref Range Status   Specimen Description URINE, CATHETERIZED  Final   Special Requests NONE  Final   Culture NO GROWTH  Final   Report Status 04/06/2016 FINAL  Final    Studies/Results: Dg Sacrum/coccyx  04/06/2016  CLINICAL DATA:  Patient has an open sore over the posterior aspect of the right hip. The patient is paraplegic ; suspect osteomyelitis. EXAM: SACRUM AND COCCYX - 2+ VIEW COMPARISON:  CT scan the abdomen pelvis of March 20, 2012 FINDINGS: The sacrum is subjectively osteopenic. The contour of the sacrum and coccyx appears normal on the lateral view. No acute fracture or lytic or blastic lesion is observed. There is dislocation of the right hip joint with osteolysis of the humeral head. The bones are diffusely osteopenic. There are destructive changes of the inferior aspect of the acetabulum and inferior and lateral aspects of the inferior pubic ramus. The left hip is grossly normal. IMPRESSION: 1. No acute abnormality of the sacrum or coccyx. 2. Osteomyelitis involving the right hemipelvis including the inferior aspect of the acetabulum, inferior pubic ramus, and femoral head. 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 Electronically Signed   By: David  [place] M.D.   On: 04/06/2016 13:41   Ct Pelvis W Contrast  04/06/2016  CLINICAL DATA:  Sacral decubitus ulcer and osteomyelitis. EXAM: CT PELVIS WITH  CONTRAST TECHNIQUE: Multidetector CT imaging of the pelvis was performed using the standard protocol following the bolus administration of intravenous contrast. CONTRAST:  ISOVUE-300 IOPAMIDOL (ISOVUE-300) INJECTION 61% COMPARISON:  Previous examinations, including the pelvis and hip radiographs obtained earlier today and pelvis CT dated 03/20/2012. FINDINGS: Urinary Tract: Distended urinary bladder with mild diffuse wall thickening with minimal progression since 03/20/2012. No urinary tract calculi are seen. Bowel: Extensive motion artifacts on the more superior images. Moderate diffuse inferior rectal  wall thickening with progression since 03/20/2012. Vascular/Lymphatic: No pathologically enlarged lymph nodes. No significant vascular abnormality seen. Reproductive: Mildly enlarged prostate gland with a central TUR defect. Other:  None. Musculoskeletal: Linear soft tissue defect at the inferior buttock fold on the right, extending toward the inferior pubic ramus. Again demonstrated is extensive bone destruction involving the inferior pubic ramus, ischium, femoral head and femoral neck with a pathological fracture through the femoral neck and lateral and superior displacement of the remainder of the femur. There is also moderate soft tissue thickening surrounding the right hip region with a fluid collection adjacent to the lateral aspect of the ischium, posteriorly, measuring 3.6 x 2.3 cm on axial image number 38 of series 202 and 2.8 cm in length on coronal image number 56. No significant surrounding rim enhancement seen. No soft tissue gas. IMPRESSION: 1. Decubitus ulcer at the right inferior buttock fold extending toward the inferior pubic ramus with extensive osteomyelitis involving the inferior pubic ramus, ischium, femoral head and femoral neck, as described above. 2. Pathological fracture of the right femoral neck with significant lateral and proximal displacement of the distal fragment. 3. Moderate  soft tissue thickening surrounding the right hip region with a 3.6 x 2.8 x 2.3 cm probable developing abscess adjacent to the inferior, posterior aspect of the ischium. 4. Interval moderate diffuse rectal wall thickening. This is most likely due to a secondary proctitis. Malignancy cannot be excluded but is felt significantly less likely. 5. Chronic bladder outlet obstruction with bladder distention and mild diffuse wall thickening with mild progression. Electronically Signed   By: Beckie Salts M.D.   On: 04/06/2016 16:54   Dg Hip Unilat With Pelvis 2-3 Views Right  04/06/2016  CLINICAL DATA:  Open sore over the posterior aspect of the right hip. Patient has fever ; suspicion of osteomyelitis. EXAM: DG HIP (WITH OR WITHOUT PELVIS) 2-3V RIGHT COMPARISON:  KUB of January 11, 2016 FINDINGS: Since the previous study there has been resorption of the femoral head with superior migration of the femur. There are destructive changes of the acetabulum posteriorly and inferiorly and there is osteolysis of portions of the inferior pubic ramus. IMPRESSION: Severe bony changes of the right hip and hemipelvis consistent with osteomyelitis. There is dislocation of the right hip joint with resorption of the right femoral head and portions of the acetabulum and inferior pubic ramus. These findings have developed since January 11, 2016. These results will be called to the ordering clinician or representative by the Radiologist Assistant, and communication documented in the PACS or zVision Dashboard. Electronically Signed   By: David  [place] M.D.   On: 04/06/2016 13:43      Assessment/Plan:  INTERVAL HISTORY: CT scan done yesterday shows:  1. Decubitus ulcer at the right inferior buttock fold extending toward the inferior pubic ramus with extensive osteomyelitis involving the inferior pubic ramus, ischium, femoral head and femoral neck, as described above. 2. Pathological fracture of the right femoral neck with  significant lateral and proximal displacement of the distal fragment. 3. Moderate soft tissue thickening surrounding the right hip region with a 3.6 x 2.8 x 2.3 cm probable developing abscess adjacent to the inferior, posterior aspect of the ischium. 4. Interval moderate diffuse rectal wall thickening. This is most likely due to a secondary proctitis. Malignancy cannot be excluded but is felt significantly less likely. 5. 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 male with  Paraplegia T10 levelwho was admitted in April with necrotizing fasciitis of his right hip. At that time he had bacteremia with group B strep and enterococcus. He was transferred to [facility] and underwent wound debridement, right Girdlestone procedure and diverting colostomy. He completed 6-1/2 weeks of antibiotic therapy with ampicillin sulbactam on 02/27/2016 and was readmitted with high fevers to [facility]. He has continued to have fevers through vancomycin and zosyn and now found to have a developing abscess near his ischium along with pathological fractue of right humerus and femoral neck as well as extensive osto in pubic ramus, ischium and femoral head   #1 FUO: I think his extensive osteomyelitis in pelvis with fractures and now developing abscess would seem the most likely explanation for his fevers.  Orthopedic surgery is recommending transfer to [facility] for possible hemi-pelvectomy  One could consider the possibility that he might harbor an ESBL but IF he has surgery in his future then we can prove this with deep cultures  No need to broaden abx at this  point  #2 Extensive osteomyelitis: see above. OK to continue vancomycin and zosyn for now       LOS: 1 day   Acey Lav 04/07/2016, 5:17 PM

## 2016-04-07 NOTE — Progress Notes (Signed)
   Subjective: He remained febrile overnight at 102.5 at highest and was 100.4 this morning. He has no complaints at this time. He does not feel any pain below his waist. I updated him and his wife (over the phone) of the CT scan and findings.  Objective: Vital signs in last 24 hours: Filed Vitals:   04/06/16 2225 04/07/16 0123 04/07/16 0629 04/07/16 0915  BP: 143/72 142/72 139/76 124/90  Pulse: 114 111 111 110  Temp: 102.5 F (39.2 C) 102 F (38.9 C) 101.9 F (38.8 C) 100.4 F (38 C)  TempSrc: Oral Oral Oral Oral  Resp: 20 20 18 20   Height:      Weight:      SpO2: 97% 96% 97% 97%   Physical Exam General: paraplegic man resting comfortably in bed, NAD, pleasant CV: RRR, no m/g/r Pulm: CTA bilaterally Skin: right acetablular and sacral decubitus ulcers without significant erythema or fluctuance. Wound VAC over right acetabulum wound is clean.  Neuro: alert and oriented x 3  Imaging CT Pelvis w contrast 7/14: 1. Decubitus ulcer at the right inferior buttock fold extending toward the inferior pubic ramus with extensive osteomyelitis involving the inferior pubic ramus, ischium, femoral head and femoral neck, as described above. 2. Pathological fracture of the right femoral neck with significant lateral and proximal displacement of the distal fragment. 3. Moderate soft tissue thickening surrounding the right hip region with a 3.6 x 2.8 x 2.3 cm probable developing abscess adjacent to the inferior, posterior aspect of the ischium. 4. Interval moderate diffuse rectal wall thickening. This is most likely due to a secondary proctitis. Malignancy cannot be excluded but is felt significantly less likely. 5. Chronic bladder outlet obstruction with bladder distention and mild diffuse wall thickening with mild progression.   Assessment/Plan:  Right Hemipelvis Osteomyelitis: As evidenced by CT scan above. He remained febrile overnight up to 102.5. He is on Vancomycin and Zosyn.  Blood cx from 7/13 are negative to date. Will follow up ID recommendations for today. Orthopedics is recommending transfer to [facility] so that he can be evaluated by the surgeons who operated on him previously. He will need a hemipelvectomy.  - Continue Vanc/Zosyn - Appreciate ID and Ortho recommendations - Trend fever curve  - Tramadol if needed for any pain - Tylenol PRN fever   HTN: BP stable in 120s systolic.  - Continue home HCTZ, Lisinopril  Diet: Heart healthy DVT PPx: Lovenox SQ Dispo: Likely will need transfer to [facility] for surgical intervention  LOS: 1 day   Su Hoff, MD 04/07/2016, 1:20 PM Pager: [phone]

## 2016-04-08 DIAGNOSIS — R6510 Systemic inflammatory response syndrome (SIRS) of non-infectious origin without acute organ dysfunction: Secondary | ICD-10-CM

## 2016-04-08 LAB — BASIC METABOLIC PANEL
ANION GAP: 9 (ref 5–15)
BUN: 7 mg/dL (ref 6–20)
CHLORIDE: 98 mmol/L — AB (ref 101–111)
CO2: 27 mmol/L (ref 22–32)
Calcium: 8.9 mg/dL (ref 8.9–10.3)
Creatinine, Ser: 0.77 mg/dL (ref 0.61–1.24)
GFR calc non Af Amer: >60 mL/min (ref >60)
Glucose, Bld: 113 mg/dL — ABNORMAL HIGH (ref 65–99)
POTASSIUM: 3.6 mmol/L (ref 3.5–5.1)
Sodium: 134 mmol/L — ABNORMAL LOW (ref 135–145)

## 2016-04-08 NOTE — Progress Notes (Signed)
OSTOMY: Ostomy pouches arrived as ordered. Patient preferred to replace pouches himself, he declines to use ostomy rings. After assisting patient in cleaning and drying skin, he applied new pouch. Its clean dry and intact.   V.A.C: V.A.C pump, cannister and medium dressing kit arrived as ordered. Patient's home V.A.C is disconnected, put in a patient's belonging bag, labeled and put in his closet. Patient called his wife to pick up home V.A.C. Hospital provided V.A.C and cannister is connected to patient.  Medium V.A.C dressing kit is in patient's room.

## 2016-04-08 NOTE — Progress Notes (Signed)
   Subjective: No acute events overnight. He remains febrile with Tmax 101.2. He has no complaints.   Objective: Vital signs in last 24 hours: Filed Vitals:   04/07/16 1700 04/07/16 2204 04/08/16 0205 04/08/16 0617  BP: 178/87 148/80 136/83 133/60  Pulse: 100 114 98 100  Temp: 99.1 F (37.3 C) 101.2 F (38.4 C) 100 F (37.8 C) 100.6 F (38.1 C)  TempSrc: Oral Oral Oral Oral  Resp: 18 18 18 20   Height:      Weight:      SpO2: 100% 98% 97% 96%   Physical Exam General: paraplegic man resting comfortably in bed, NAD, pleasant CV: RRR, no m/g/r Pulm: CTA bilaterally Skin: right acetablular and sacral decubitus ulcers without significant erythema or fluctuance. Wound VAC over right acetabulum wound is clean.  Neuro: alert and oriented x 3  Imaging CT Pelvis w contrast 7/14: 1. Decubitus ulcer at the right inferior buttock fold extending toward the inferior pubic ramus with extensive osteomyelitis involving the inferior pubic ramus, ischium, femoral head and femoral neck, as described above. 2. Pathological fracture of the right femoral neck with significant lateral and proximal displacement of the distal fragment. 3. Moderate soft tissue thickening surrounding the right hip region with a 3.6 x 2.8 x 2.3 cm probable developing abscess adjacent to the inferior, posterior aspect of the ischium. 4. Interval moderate diffuse rectal wall thickening. This is most likely due to a secondary proctitis. Malignancy cannot be excluded but is felt significantly less likely. 5. Chronic bladder outlet obstruction with bladder distention and mild diffuse wall thickening with mild progression.   Assessment/Plan:  Right Hemipelvis Osteomyelitis: As evidenced by CT scan above. He remains febrile. He is on Vancomycin and Zosyn. Blood cx from 7/13 are negative to date. I spoke with both Orthopedic Surgery and General Surgery at [facility] and both have refused to accept the patient to their  service. May consider trying again tomorrow to see if a different surgeon is on call vs calling [facility] or [facility]. Very frustrating situation as this gentleman will ultimately require surgery in order to get better.  - Continue Vanc/Zosyn - Appreciate ID and Ortho recommendations - Trend fever curve  - Tramadol if needed for any pain - Tylenol PRN fever   HTN: BP stable in 120s-130s systolic.  - Continue home HCTZ, Lisinopril  Diet: Heart healthy DVT PPx: Lovenox SQ Dispo: Will need transfer to a tertiary center for surgery  LOS: 2 days   Su Hoff, MD 04/08/2016, 10:48 AM Pager: [phone]

## 2016-04-08 NOTE — Progress Notes (Signed)
Patient's wife expressed concerns about wound VAC dressing through MD. Patient is made aware that wound nurse will be in on Monday to change dressing, he stated that he knew that wound nurses are available Monday, Wednesday and Friday. RN also discussed the hospital supplied V.A.C with patient. He said he had no concerns at this time. V.A.C beeped several times according to patient- he called for RN. Upon entering the room the V.A.C reads that there is a "high leak", upon assessing the V.A.C, tubing and patient's wound dressing, it appears that a strip of tape around lower buttocks is not quite stuck to patient.  A clear tegaderm is applied to the aforementioned area to reinforce the tape. This stopped the "high leak" beep on the V.A.C. Will continue to monitor.

## 2016-04-08 NOTE — Consult Note (Signed)
[facility] Nurse ostomy consult note Discussed with patient's bedside RN the need to order ostomy pouches as the one patient is wearing is leaking. Patient reports that he wears a 1-piece pouching system.  RN is assisted by the provision of the Kellie Simmering # to order ostomy supply. Kellie Simmering [phone]) plus a skin barrier ring Kellie Simmering [phone]).  RN is requested to order 3 of each for patient's use while in house. [place] Nurse wound consult note Reason for Consult:Patient arrived on unit with home V.A.C.  Bedside RN is assisted in the ordering of a hospital-issued V.A.C, V.A.C cannister and medium V.A.C dressing kit for the routine change tomorrow, 7/17.  Additionally, she is requested to disconnect the patient from the home V.A.C, place that unit in a patient belonging bag and secure it in the patient's closet and attach the patient to the hospital issued V.A.C.unit. [place] nursing team will see tomorrow for dressing change.  Thanks, Maudie Flakes, MSN, RN, [place], Arther Abbott  Pager# [phone]

## 2016-04-08 NOTE — Progress Notes (Signed)
Subjective: No new complaints, however he did ask me to call his wife on his cell phone and his wife is concerned and apparently now is apparent he is concerned about how the wound vacuum dressing is being changed I relayed these concerns to his nurse and it sounds like communication between the patient's wife and wound care or between [facility] and wound care and wound care locally would be helpful to clarify this   Antibiotics:  Anti-infectives    Start     Dose/Rate Route Frequency Ordered Stop   04/07/16 1700  vancomycin (VANCOCIN) 1,250 mg in sodium chloride 0.9 % 250 mL IVPB     1,250 mg 166.7 mL/hr over 90 Minutes Intravenous Every 12 hours 04/07/16 1613     04/07/16 0300  piperacillin-tazobactam (ZOSYN) IVPB 3.375 g     3.375 g 12.5 mL/hr over 240 Minutes Intravenous Every 8 hours 04/06/16 1905     04/06/16 0800  piperacillin-tazobactam (ZOSYN) IVPB 3.375 g  Status:  Discontinued     3.375 g 12.5 mL/hr over 240 Minutes Intravenous Every 8 hours 04/06/16 0224 04/06/16 1905   04/06/16 0400  vancomycin (VANCOCIN) IVPB 750 mg/150 ml premix  Status:  Discontinued     750 mg 150 mL/hr over 60 Minutes Intravenous Every 12 hours 04/06/16 0225 04/07/16 1613   04/06/16 0230  piperacillin-tazobactam (ZOSYN) IVPB 3.375 g  Status:  Discontinued     3.375 g 12.5 mL/hr over 240 Minutes Intravenous Every 6 hours 04/06/16 0220 04/06/16 0223   04/06/16 0230  piperacillin-tazobactam (ZOSYN) IVPB 3.375 g     3.375 g 100 mL/hr over 30 Minutes Intravenous  Once 04/06/16 0224 04/06/16 0323   04/05/16 1700  vancomycin (VANCOCIN) IVPB 750 mg/150 ml premix  Status:  Discontinued     750 mg 150 mL/hr over 60 Minutes Intravenous Every 12 hours 04/05/16 1605 04/05/16 1731   04/05/16 1630  piperacillin-tazobactam (ZOSYN) IVPB 3.375 g  Status:  Discontinued     3.375 g 12.5 mL/hr over 240 Minutes Intravenous Every 8 hours 04/05/16 1605 04/05/16 1731      Medications: Scheduled  Meds: . cholecalciferol  1,000 Units Oral Daily  . citalopram  20 mg Oral q morning - 10a  . docusate sodium  100 mg Oral Daily  . enoxaparin (LOVENOX) injection  40 mg Subcutaneous Q24H  . feeding supplement (ENSURE ENLIVE)  237 mL Oral TID BM  . lisinopril  20 mg Oral Daily   And  . hydrochlorothiazide  25 mg Oral Daily  . piperacillin-tazobactam (ZOSYN)  IV  3.375 g Intravenous Q8H  . vancomycin  1,250 mg Intravenous Q12H  . vitamin C  1,000 mg Oral q morning - 10a   Continuous Infusions:  PRN Meds:.acetaminophen, naproxen, traMADol    Objective: Weight change:   Intake/Output Summary (Last 24 hours) at 04/08/16 1437 Last data filed at 04/08/16 0206  Gross per 24 hour  Intake      0 ml  Output    600 ml  Net   -600 ml   Blood pressure 139/86, pulse 106, temperature 99.6 F (37.6 C), temperature source Oral, resp. rate 20, height 5\' 4"  (1.626 m), weight 134 lb (60.782 kg), SpO2 97 %. Temp:  [99.1 F (37.3 C)-101.2 F (38.4 C)] 99.6 F (37.6 C) (07/16 1110) Pulse Rate:  [98-114] 106 (07/16 1110) Resp:  [18-20] 20 (07/16 1110) BP: (133-178)/(60-87) 139/86 mmHg (07/16 1110) SpO2:  [96 %-100 %] 97 % (07/16  1110)  Physical Exam: General: Alert and awake, oriented x3, not in any acute distress. HEENT: EOMI CVS regular rate, normal r Chest:  no wheezing, no resp distress rally Skin: bandages in place  Neuro: paraplegic  CBC:  CBC Latest Ref Rng 04/06/2016 04/05/2016 01/12/2016  WBC 4.0 - 10.5 K/uL 9.0 10.3 24.5(H)  Hemoglobin 13.0 - 17.0 g/dL 1.6(X) 11.1(L) 9.4(L)  Hematocrit 39.0 - 52.0 % 29.4(L) 35.3(L) 28.0(L)  Platelets 150 - 400 K/uL 246 279 276       BMET  Recent Labs  04/06/16 0514 04/08/16 0439  NA 134* 134*  K 3.4* 3.6  CL 100* 98*  CO2 24 27  GLUCOSE 189* 113*  BUN 11 7  CREATININE 0.76 0.77  CALCIUM 8.3* 8.9     Liver Panel   Recent Labs  04/05/16 1438  PROT 7.3  ALBUMIN 2.9*  AST 19  ALT 14*  ALKPHOS 78  BILITOT 0.9        Sedimentation Rate No results for input(s): ESRSEDRATE in the last 72 hours. C-Reactive Protein No results for input(s): CRP in the last 72 hours.  Micro Results: Recent Results (from the past 720 hour(s))  Culture, blood (Routine x 2)     Status: None (Preliminary result)   Collection Time: 04/05/16  2:58 PM  Result Value Ref Range Status   Specimen Description BLOOD LEFT FOREARM  Final   Special Requests BOTTLES DRAWN AEROBIC AND ANAEROBIC 5CC  Final   Culture NO GROWTH 2 DAYS  Final   Report Status PENDING  Incomplete  Culture, blood (Routine x 2)     Status: None (Preliminary result)   Collection Time: 04/05/16  3:00 PM  Result Value Ref Range Status   Specimen Description BLOOD RIGHT ANTECUBITAL  Final   Special Requests BOTTLES DRAWN AEROBIC AND ANAEROBIC 10CC  Final   Culture NO GROWTH 2 DAYS  Final   Report Status PENDING  Incomplete  Urine culture     Status: None   Collection Time: 04/05/16  3:59 PM  Result Value Ref Range Status   Specimen Description URINE, CATHETERIZED  Final   Special Requests NONE  Final   Culture NO GROWTH  Final   Report Status 04/06/2016 FINAL  Final    Studies/Results: Ct Pelvis W Contrast  04/06/2016  CLINICAL DATA:  Sacral decubitus ulcer and osteomyelitis. EXAM: CT PELVIS WITH CONTRAST TECHNIQUE: Multidetector CT imaging of the pelvis was performed using the standard protocol following the bolus administration of intravenous contrast. CONTRAST:  ISOVUE-300 IOPAMIDOL (ISOVUE-300) INJECTION 61% COMPARISON:  Previous examinations, including the pelvis and hip radiographs obtained earlier today and pelvis CT dated 03/20/2012. FINDINGS: Urinary Tract: Distended urinary bladder with mild diffuse wall thickening with minimal progression since 03/20/2012. No urinary tract calculi are seen. Bowel: Extensive motion artifacts on the more superior images. Moderate diffuse inferior rectal wall thickening with progression since 03/20/2012.  Vascular/Lymphatic: No pathologically enlarged lymph nodes. No significant vascular abnormality seen. Reproductive: Mildly enlarged prostate gland with a central TUR defect. Other:  None. Musculoskeletal: Linear soft tissue defect at the inferior buttock fold on the right, extending toward the inferior pubic ramus. Again demonstrated is extensive bone destruction involving the inferior pubic ramus, ischium, femoral head and femoral neck with a pathological fracture through the femoral neck and lateral and superior displacement of the remainder of the femur. There is also moderate soft tissue thickening surrounding the right hip region with a fluid collection adjacent to the lateral aspect of the ischium,  posteriorly, measuring 3.6 x 2.3 cm on axial image number 38 of series 202 and 2.8 cm in length on coronal image number 56. No significant surrounding rim enhancement seen. No soft tissue gas. IMPRESSION: 1. Decubitus ulcer at the right inferior buttock fold extending toward the inferior pubic ramus with extensive osteomyelitis involving the inferior pubic ramus, ischium, femoral head and femoral neck, as described above. 2. Pathological fracture of the right femoral neck with significant lateral and proximal displacement of the distal fragment. 3. Moderate soft tissue thickening surrounding the right hip region with a 3.6 x 2.8 x 2.3 cm probable developing abscess adjacent to the inferior, posterior aspect of the ischium. 4. Interval moderate diffuse rectal wall thickening. This is most likely due to a secondary proctitis. Malignancy cannot be excluded but is felt significantly less likely. 5. Chronic bladder outlet obstruction with bladder distention and mild diffuse wall thickening with mild progression. Electronically Signed   By: Beckie Salts M.D.   On: 04/06/2016 16:54      Assessment/Plan:  INTERVAL HISTORY: CT scan done 04/06/16 shows:  1. Decubitus ulcer at the right inferior buttock fold  extending toward the inferior pubic ramus with extensive osteomyelitis involving the inferior pubic ramus, ischium, femoral head and femoral neck, as described above. 2. Pathological fracture of the right femoral neck with significant lateral and proximal displacement of the distal fragment. 3. Moderate soft tissue thickening surrounding the right hip region with a 3.6 x 2.8 x 2.3 cm probable developing abscess adjacent to the inferior, posterior aspect of the ischium. 4. Interval moderate diffuse rectal wall thickening. This is most likely due to a secondary proctitis. Malignancy cannot be excluded but is felt significantly less likely. 5. 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 male with  Paraplegia T10 levelwho was admitted in April with necrotizing fasciitis of his right hip. At that time he had bacteremia with group B strep and enterococcus. He was transferred to [facility] and underwent wound debridement, right Girdlestone procedure and diverting colostomy. He completed 6-1/2 weeks of antibiotic therapy with ampicillin sulbactam on 02/27/2016 and was readmitted with high fevers to [facility]. He has continued to have fevers through vancomycin and zosyn and now found to have a developing abscess near his ischium along with pathological fractue of right humerus and femoral neck as well as extensive osto in pubic ramus, ischium and femoral head   #1 FUO: I continue to believe  his  extensive osteomyelitis in pelvis with fractures and now developing abscess would seem the most likely explanation for his fevers.  Orthopedic surgery is recommending transfer to [facility] care facility  for possible hemi-pelvectomy  One could consider the possibility that he might harbor an ESBL but IF he has surgery in his future then we can prove this with deep cultures   #2 Extensive osteomyelitis: see above. OK to continue vancomycin and zosyn for now  Dr. Luciana Axe to pick up service tomorrow.      LOS: 2 days  Acey Lav 04/08/2016, 2:36 PM

## 2016-04-09 DIAGNOSIS — R509 Fever, unspecified: Secondary | ICD-10-CM | POA: Insufficient documentation

## 2016-04-09 DIAGNOSIS — Z7189 Other specified counseling: Secondary | ICD-10-CM

## 2016-04-09 NOTE — Progress Notes (Signed)
    [facility]   Reason for visit: Follow up on osteomyelitis  Interval History: afebrile now, wife asking about placing a foley to keep wound area dry. States at [facility] they kept foley in since he was on antibiotics anyway.    Physical Exam: Constitutional:  Filed Vitals:   04/09/16 0133 04/09/16 0604  BP: 139/80 140/80  Pulse: 95 92  Temp: 99.7 F (37.6 C) 98.5 F (36.9 C)  Resp: 18 18   patient appears in NAD Respiratory: Normal respiratory effort; CTA B Cardiovascular: RRR  Review of Systems: Constitutional: negative for fevers and chills Gastrointestinal: negative for nausea  Lab Results  Component Value Date   WBC 9.0 04/06/2016   HGB 9.3* 04/06/2016   HCT 29.4* 04/06/2016   MCV 79.0 04/06/2016   PLT 246 04/06/2016    Lab Results  Component Value Date   CREATININE 0.77 04/08/2016   BUN 7 04/08/2016   NA 134* 04/08/2016   K 3.6 04/08/2016   CL 98* 04/08/2016   CO2 27 04/08/2016    Lab Results  Component Value Date   ALT 14* 04/05/2016   AST 19 04/05/2016   ALKPHOS 78 04/05/2016     Microbiology: Recent Results (from the past 240 hour(s))  Culture, blood (Routine x 2)     Status: None (Preliminary result)   Collection Time: 04/05/16  2:58 PM  Result Value Ref Range Status   Specimen Description BLOOD LEFT FOREARM  Final   Special Requests BOTTLES DRAWN AEROBIC AND ANAEROBIC 5CC  Final   Culture NO GROWTH 3 DAYS  Final   Report Status PENDING  Incomplete  Culture, blood (Routine x 2)     Status: None (Preliminary result)   Collection Time: 04/05/16  3:00 PM  Result Value Ref Range Status   Specimen Description BLOOD RIGHT ANTECUBITAL  Final   Special Requests BOTTLES DRAWN AEROBIC AND ANAEROBIC 10CC  Final   Culture NO GROWTH 3 DAYS  Final   Report Status PENDING  Incomplete  Urine culture     Status: None   Collection Time: 04/05/16  3:59 PM  Result Value Ref Range Status   Specimen Description URINE,  CATHETERIZED  Final   Special Requests NONE  Final   Culture NO GROWTH  Final   Report Status 04/06/2016 FINAL  Final    Impression/Plan:  1. Abscess and osteomyelitis - I agree he needs debridement with extensive infection in addition to long term antibiotics.  May need hemipelvectomy.   2. Urine in wound - wife asking about placing a foley to avoid infection.  I recommended to her not to have a foley placed as his risk for urinary infection is high despite antibiotics since it does not 'prophylax' and likelihood of infection for a typically sterile site is low.  Wife voiced her understanding.    I will continue to follow intermittently until transfer

## 2016-04-09 NOTE — Consult Note (Signed)
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  Sacral/coccyx wounds are pink, moist, one with some fibrinous tissue Left trocanter; epithelial buds, with some fibrin Drainage (amount, consistency, odor) minimal from each site Periwound: intact  Dressing procedure/placement/frequency: 1pc of black foam used to fill the wound bed, attempted to place into tunnel.  May benefit from white foam for this reason.  Drape used to protect the skin to the right trochanter region and 1pc of black foam used to bridge TRAC pad from the site to the right hip.1/2 of 2" ostomy barrier ring used to aid in seal where crease is located just distal to the wound. Demonstrated this technique to the wife.  Seal at . Patient tolerated well Patient has only one intact turning surface, therefore I will add a Low air loss mattress for pressure redistribution. Soft silicone foam to the left trochanter, change every 3 days. Add enzymatic debridement ointment to the largest wound on the coccyx, apply daily, cover with moist gauze and foam   WOC team will follow along for complex VAC dressing change Bhumi Godbey Marlena Clipper, [employer]

## 2016-04-09 NOTE — Progress Notes (Signed)
Got a message from the Secretary that someone called from [facility] wanting to give update about pt's plan for transfer, called the number [phone] back but a got a voice mail, dropped a message with my number to call back if needed, pt quiet in bed, will continue to monitor. [name], Son Barkan Efe

## 2016-04-09 NOTE — Progress Notes (Signed)
   Subjective: Pt. Was feeling better, no complaints.He is being afebrile since yesterday. Nurses have some concerns over repeatedly leaking condom catheter and leaking IV line. Objective: Vital signs in last 24 hours: Filed Vitals:   04/08/16 1845 04/08/16 2118 04/09/16 0133 04/09/16 0604  BP: 144/82 152/76 139/80 140/80  Pulse: 92 96 95 92  Temp: 99 F (37.2 C) 98.9 F (37.2 C) 99.7 F (37.6 C) 98.5 F (36.9 C)  TempSrc: Oral Oral Oral Oral  Resp: 16 18 18 18   Height:      Weight:      SpO2: 99% 97% 98% 96%   Physical Exam General: Paraplegic man lying comfortably. No signs of distress. Chest: Clear, No added sound CVS: RRR, No R/G/M. Abd: Soft. There is no tenderness. There is no rebound and no guarding.  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 CNS: Alert, oriented. Paraplegic Extremities: No edema or tenderness, Peripheral pulses +ve. Skin: Warm and dry.There is a wound VAC in place to the right gluteal region with no surrounding erythema, induration, tenderness. There is a sacral decubitus ulcer with minimal local erythema, no drainage. There is a decubitus ulcer on the left hip with minimal erythema, no drainage.  Meds: Reviewed  Labs: Blood cultures remain negative.   Assessment/Plan: Right Hemipelvis Osteomyelitis:He is a febrile since yesterday. He needs surgical invention. As [facility]. And general surgery both refused to take pt. over the week end.Pt. Is more interested at [facility] due to some friends support there. We will try contacting them and then proceed accordingly. Nurses have some concern about leaking condom catheter and putting a pick line for prolong antibiotic use. We will avoid Foley  and  Try to get another peripheral line to avoid another source of infection. -Continue Vanc. And Zosyn - Monitor his temp. - Tylenol PRN for fever   HTN: His BP was 161/74. Continue with his home meds. Lisinopril-HCTZ 20-25  QD   Dispo: Anticipated discharge in approximately 1-2 day(s).   LOS: 3 days   Arnetha Courser, MD 04/09/2016, 11:26 AM Pager: [phone]

## 2016-04-10 LAB — CULTURE, BLOOD (ROUTINE X 2)
CULTURE: NO GROWTH
CULTURE: NO GROWTH

## 2016-04-10 LAB — BASIC METABOLIC PANEL
ANION GAP: 7 (ref 5–15)
BUN: 10 mg/dL (ref 6–20)
CHLORIDE: 100 mmol/L — AB (ref 101–111)
CO2: 27 mmol/L (ref 22–32)
Calcium: 9.1 mg/dL (ref 8.9–10.3)
Creatinine, Ser: 0.74 mg/dL (ref 0.61–1.24)
GFR calc non Af Amer: >60 mL/min (ref >60)
GLUCOSE: 105 mg/dL — AB (ref 65–99)
POTASSIUM: 4.3 mmol/L (ref 3.5–5.1)
Sodium: 134 mmol/L — ABNORMAL LOW (ref 135–145)

## 2016-04-10 NOTE — Progress Notes (Signed)
   Subjective: Pt. Was feeling better, no concerns. Objective: Vital signs in last 24 hours: Filed Vitals:   04/10/16 0300 04/10/16 0626 04/10/16 1037 04/10/16 1443  BP: 120/61 133/74 134/67 142/73  Pulse: 97 86 88 85  Temp: 99.6 F (37.6 C) 98.5 F (36.9 C) 98.1 F (36.7 C) 98.7 F (37.1 C)  TempSrc: Oral Oral Oral Oral  Resp:  18 18 18   Height:      Weight:      SpO2: 98% 100% 100% 100%   Physical Exam  General: Paraplegic man lying comfortably. No signs of distress. Chest: Clear, No added sound CVS: RRR, No R/G/M. Abd: Soft. There is no tenderness. There is no rebound and no guarding.  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 CNS: Alert, oriented. Paraplegic Extremities: No edema or tenderness, Peripheral pulses +ve.  Assessment/Plan:  Right Hemipelvis Osteomyelitis:He is a febrile for the last two days. He needs surgical invention. As [facility]. And general surgery both refused to take pt. over the week end. Pura Spice is trying to get a place at [facility] where he can get optimal care. They said to let us know by tomorrow after consulting the whole team as his care going to involve different specialities. -Continue Vanc. And Zosyn - Monitor his temp. - Tylenol PRN for fever   HTN: His BP was 161/74. Continue with his home meds. Lisinopril-HCTZ 20-25 QD   Dispo: Anticipated discharge in approximately 1-2 day(s).   LOS: 4 days   Arnetha Courser, MD 04/10/2016, 2:50 PM Pager: [phone]

## 2016-04-10 NOTE — Progress Notes (Signed)
Received a call from the transfer coordinator in [facility] hospital wanting to verify if pt needed to be transferred to [facility], said the day shift personnel will call later today to set that up with the doctors. [name], Maika Kaczmarek Efe

## 2016-04-10 NOTE — Progress Notes (Signed)
Pharmacy Antibiotic Note  Cameron Zavala is a 40 y.o. male admitted on 04/05/2016 with sepsis/wound.  Pharmacy has been consulted for Vancomycin and Zosyn dosing.  Vanc 7/13>> Zosyn 7/13>>  7/13 BCx x2 ngtd 7/13 UCx neg/final  Plan: Zosyn 3.375g IV q8h (4 hour infusion).  Vancomycin to 1250 mg iv Q 12 hours Vancomycin trough in AM pending transfer   Height: 5\' 4"  (162.6 cm) Weight: 134 lb (60.782 kg) IBW/kg (Calculated) : 59.2  Temp (24hrs), Avg:99 F (37.2 C), Min:98.5 F (36.9 C), Max:99.6 F (37.6 C)   Recent Labs Lab 04/05/16 1438 04/05/16 1451 04/06/16 0514 04/06/16 0800 04/07/16 1525 04/08/16 0439 04/10/16 0843  WBC 10.3  --  9.0  --   --   --   --   CREATININE 0.87  --  0.76  --   --  0.77 0.74  LATICACIDVEN  --  1.42 1.1 0.8  --   --   --   VANCOTROUGH  --   --   --   --  10*  --   --     Estimated Creatinine Clearance: 102.8 mL/min (by C-G formula based on Cr of 0.74).    Allergies  Allergen Reactions  . Baclofen Itching  . Flexeril [Cyclobenzaprine] Other (See Comments)    Extreme tiredness for 24 hours  . Potassium Nausea And Vomiting  . Silver Itching  . Zinc Swelling  . Carbamazepine Hives and Rash  . Chocolate Itching and Rash  . Flour Itching and Rash    Toast, biscuits rolls   . Meat Extract Itching and Rash    Different types of steak  . Other Itching and Rash    Vanilla pudding,  cookies (wafers with white cream in the middle), microwave meals  . Peanut-Containing Drug Products Itching and Rash    Red lips  . Pumpkin Seed Itching and Rash  . Rosuvastatin Calcium Itching and Rash  . Statins Itching and Rash  . Sulfonamide Derivatives Itching and Rash  . Sunflower Seed [Sunflower Oil] Itching and Rash  . Tape Itching and Rash    Plastic tape.  Can only use paper tape.     Thank you for allowing pharmacy to be a part of this patient's care. Okey Regal, PharmD [phone]  04/10/2016 10:26 AM

## 2016-04-11 DIAGNOSIS — M869 Osteomyelitis, unspecified: Principal | ICD-10-CM

## 2016-04-11 LAB — VANCOMYCIN, TROUGH: Vancomycin Tr: 21 ug/mL (ref 15–20)

## 2016-04-11 MED ORDER — DEXTROSE 5 % IV SOLN
2.0000 g | INTRAVENOUS | Status: DC
Start: 2016-04-11 — End: 2016-04-27
  Administered 2016-04-11 – 2016-04-26 (×16): 2 g via INTRAVENOUS
  Filled 2016-04-11 (×18): qty 2

## 2016-04-11 NOTE — Progress Notes (Addendum)
[facility]   Reason for visit: Follow up on osteomyelitis  Interval History: case discussed by primary team with [facility] and will follow up as outpatient.    Physical Exam: Constitutional:  Filed Vitals:   04/11/16 0615 04/11/16 1000  BP: 130/74 145/73  Pulse: 79 85  Temp: 98.4 F (36.9 C) 98.1 F (36.7 C)  Resp: 18 20   patient appears in NAD  Impression: osteomeylitis.    Plan: 1. Probably needs debridement and will follow with [facility]ics for consideration. 2. I would continue with antibiotics pending orthopedics evaluation with vancomycin and ceftriaxone 2 grams once daily via picc line.   Duration of course will be dependent on operative management and findings.   Will have patient follow up with [place] after evaluation.  I would set the duration now for 4 weeks.   Weekly cbc, cmp, crp esr. thanks

## 2016-04-11 NOTE — Consult Note (Addendum)
WOC Nurse wound follow up Wound type: Stage 4 Pressure Injury Measurement: see previous notes this week from [facility] nurse Wound bed: clean, pink, 100% granulation tissue, unable to visualize wound base Drainage (amount, consistency, odor) minimal in canister, serous Periwound:  intact Dressing procedure/placement/frequency: 1pc of black foam used to fill wound and into tunneled area. Drape used to protect skin for bridge to the right hip, 1pc of black foam used for bridge. Seal obtained at .  Small pc of ostomy barrier ring used in crease just distal from the wound.  Patient tolerated well. Requested low air loss mattress a second time today.   WOC team will follow for complex VAC dressing changes. [name], [place] [phone]
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
## 2016-04-11 NOTE — Progress Notes (Signed)
   Subjective: No concerns today. Had his colostomy bag changed last night. Objective: Vital signs in last 24 hours: Filed Vitals:   04/10/16 2205 04/11/16 0151 04/11/16 0615 04/11/16 1000  BP: 133/75 126/64 130/74 145/73  Pulse: 83 83 79 85  Temp: 98.7 F (37.1 C) 98.8 F (37.1 C) 98.4 F (36.9 C) 98.1 F (36.7 C)  TempSrc: Oral Oral Oral Oral  Resp: 18 18 18 20   Height:      Weight:      SpO2: 98% 99% 99% 98%   Physical Exam General: Paraplegic man lying comfortably. No signs of distress. Chest: Clear, No added sound CVS: RRR, No R/G/M. Abd: Soft. There is no tenderness. There is no rebound and no guarding.  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 CNS: Alert, oriented. Paraplegic Extremities: No edema or tenderness, Peripheral pulses +ve.  Labs: BMP Latest Ref Rng 04/10/2016 04/08/2016 04/06/2016  Glucose 65 - 99 mg/dL 557(D) 220(U) 542(H)  BUN 6 - 20 mg/dL 10 7 11   Creatinine 0.61 - 1.24 mg/dL 0.62 3.76 2.83  Sodium 135 - 145 mmol/L 134(L) 134(L) 134(L)  Potassium 3.5 - 5.1 mmol/L 4.3 3.6 3.4(L)  Chloride 101 - 111 mmol/L 100(L) 98(L) 100(L)  CO2 22 - 32 mmol/L 27 27 24   Calcium 8.9 - 10.3 mg/dL 9.1 8.9 1.5(V)    Assessment/Plan:  Right Hemipelvis Osteomyelitis:He is afebrile since 7/16.Marland Kitchen He needs surgical invention. As [facility]. And general surgery both refused to take pt. over the week end. Pura Spice is trying to get a place at [facility] where he can get optimal care. Dr. Ranae Palms from [facility] wants to see him in his clinic on Tuesday 7/25 at 10:00 am before he can make final decision about his transfer. Dr. Luciana Axe was consulted about his antibiotics and he advised that we can send him home with a picc line and with a slight change in his medicine, D/C Zosyn and start Ceftriaxone  2 gm QHS and continue Vanc.  HTN: His BP was 130/74. Continue with his home meds. Lisinopril-HCTZ 20-25 QD  Dispo: Anticipated discharge in  approximately 1 day(s).   LOS: 5 days   Arnetha Courser, MD 04/11/2016, 2:00 PM Pager: [phone]

## 2016-04-11 NOTE — Progress Notes (Signed)
Pharmacy Antibiotic Note  Cameron Zavala is a 40 y.o. male admitted on 04/05/2016 with extensive osteo/abscess.  Pharmacy has been consulted for Vancomycin dosing.  Antimicrobials this admission: Vanco 7/13 >>  Zosyn 7/13 >>  Drug levels  Vanco trough 7/19>> 21 (drawn slightly early, likely ~20)  Plan: -Continue vancomycin 1250 mg IV q12h -Re-check vancomycin trough in 2-3 days to ensure still therapeutic    Height: 5\' 4"  (162.6 cm) Weight: 134 lb (60.782 kg) IBW/kg (Calculated) : 59.2  Temp (24hrs), Avg:98.5 F (36.9 C), Min:98 F (36.7 C), Max:98.8 F (37.1 C)   Recent Labs Lab 04/05/16 1438 04/05/16 1451 04/06/16 0514 04/06/16 0800 04/07/16 1525 04/08/16 0439 04/10/16 0843 04/11/16 0338  WBC 10.3  --  9.0  --   --   --   --   --   CREATININE 0.87  --  0.76  --   --  0.77 0.74  --   LATICACIDVEN  --  1.42 1.1 0.8  --   --   --   --   VANCOTROUGH  --   --   --   --  10*  --   --  21*    Estimated Creatinine Clearance: 102.8 mL/min (by C-G formula based on Cr of 0.74).    Allergies  Allergen Reactions  . Baclofen Itching  . Flexeril [Cyclobenzaprine] Other (See Comments)    Extreme tiredness for 24 hours  . Potassium Nausea And Vomiting  . Silver Itching  . Zinc Swelling  . Carbamazepine Hives and Rash  . Chocolate Itching and Rash  . Flour Itching and Rash    Toast, biscuits rolls   . Meat Extract Itching and Rash    Different types of steak  . Other Itching and Rash    Vanilla pudding,  cookies (wafers with white cream in the middle), microwave meals  . Peanut-Containing Drug Products Itching and Rash    Red lips  . Pumpkin Seed Itching and Rash  . Rosuvastatin Calcium Itching and Rash  . Statins Itching and Rash  . Sulfonamide Derivatives Itching and Rash  . Sunflower Seed [Sunflower Oil] Itching and Rash  . Tape Itching and Rash    Plastic tape.  Can only use paper tape.    [name] 04/11/2016 4:28 AM

## 2016-04-11 NOTE — Progress Notes (Signed)
CRITICAL VALUE ALERT  Critical value received: Vanco Trough level 21  Date of notification:  04/11/16  Time of notification:  0430  Critical value read back:Yes.    Nurse who received alert:  Lars Masson  MD notified (1st page):  Pharmacist notified  Time of first page:  [phone]  MD notified (2nd page):  Time of second page:  Responding MD:  NA  Time MD responded:  NA

## 2016-04-12 DIAGNOSIS — N39498 Other specified urinary incontinence: Secondary | ICD-10-CM

## 2016-04-12 DIAGNOSIS — G839 Paralytic syndrome, unspecified: Secondary | ICD-10-CM

## 2016-04-12 MED ORDER — SODIUM CHLORIDE 0.9% FLUSH
10.0000 mL | INTRAVENOUS | Status: DC | PRN
  Administered 2016-04-17 – 2016-04-20 (×2): 10 mL
  Administered 2016-04-22: 20 mL
  Administered 2016-04-24 – 2016-04-25 (×2): 10 mL
  Filled 2016-04-12 (×5): qty 40

## 2016-04-12 NOTE — Progress Notes (Signed)
[facility]  Patient Status:   Active pt with [facility] prior to this readmission.   [facility] is providing the following services: HHRN prior to admission and will now add [facility] home Infusion pharmacy team for home IV ABX. See note from earlier today entered by Lawerance Sabal, RN, Case Manager for pt today for details of pt's situation related to lack of caregiver at this time to support IV ABX home. [facility] Infusion Coordinator will work with pt for hands on training  In the a.m. to determine if pt is capable of safe self administration of IV ABX, Vancomycin and Rocephin, in the home upon DC.  CSW also assisting for possible SNF placement if pt is unable to safely learn IV ABX administration.  If patient discharges after hours, please call [phone].   Sedalia Muta 04/12/2016, 11:09 PM

## 2016-04-12 NOTE — Progress Notes (Signed)
Peripherally Inserted Central Catheter/Midline Placement  The IV Nurse has discussed with the patient and/or persons authorized to consent for the patient, the purpose of this procedure and the potential benefits and risks involved with this procedure.  The benefits include less needle sticks, lab draws from the catheter and patient may be discharged home with the catheter.  Risks include, but not limited to, infection, bleeding, blood clot (thrombus formation), and puncture of an artery; nerve damage and irregular heat beat.  Alternatives to this procedure were also discussed.  PICC/Midline Placement Documentation  PICC Single Lumen 04/12/16 PICC Right Basilic 40 cm 0 cm (Active)  Indication for Insertion or Continuance of Line Home intravenous therapies (PICC only) 04/12/2016  9:00 AM  Exposed Catheter (cm) 0 cm 04/12/2016  9:00 AM  Dressing Change Due 04/19/16 04/12/2016  9:00 AM       Franne Grip Renee 04/12/2016, 9:03 AM

## 2016-04-12 NOTE — Progress Notes (Signed)
   Subjective: No concerns today. Pt.was told about transportation to [facility], and asked if any of his friend can help. Objective: Vital signs in last 24 hours: Filed Vitals:   04/12/16 0139 04/12/16 0541 04/12/16 0920 04/12/16 1515  BP: 141/87 118/56 151/79 144/68  Pulse: 88 79 81 88  Temp: 99.2 F (37.3 C) 98.3 F (36.8 C) 99 F (37.2 C) 99 F (37.2 C)  TempSrc: Oral Oral Oral Oral  Resp: 20 20 20 20   Height:      Weight:      SpO2: 98% 99% 95% 99%   Physical Exam General: Paraplegic man lying comfortably. No signs of distress. Chest: Clear, No added sound CVS: RRR, No R/G/M. Abd: Soft. There is no tenderness. There is no rebound and no guarding.  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 CNS: Alert, oriented. Paraplegic Extremities: No edema or tenderness, Peripheral pulses +ve.  Assessment/Plan: Right Hemipelvis Osteomyelitis:He is afebrile since 7/16.Marland Kitchen He needs surgical invention. As [facility]. And general surgery both refused to take pt. over the week end. Pura Spice is trying to get a place at [facility] where he can get optimal care. Dr. Ranae Palms from [facility] wants to see him in his clinic on Tuesday 7/25 at 10:00 am before he can make final decision about his transfer. Dr. Luciana Axe was consulted about his antibiotics and he advised that we can send him home with a picc line and with a slight change in his medicine, D/C Zosyn and start Ceftriaxone 2 gm QHS and continue Vanc.Picc line was inserted yesterday. He has some real problems for being discharged home as his wife is technically blind and cannot administer his IV antibiotics and cannot drive. According to case manager he does not meet criteria for LTAC and now they are looking for a possibility in SNF. We will D/C him after these things are cleared.  HTN: His BP was 144/68. Continue with his home meds. Lisinopril-HCTZ 20-25 QH  Dispo: Anticipated discharge in approximately 1-2  day(s).    LOS: 6 days   Arnetha Courser, MD 04/12/2016, 5:24 PM Pager: [phone]

## 2016-04-12 NOTE — Care Management Note (Addendum)
Case Management Note  Patient Details  Name: Cameron Zavala MRN: [id number] Date of Birth: [birth year]  Subjective/Objective:               Para, in treatment for wound to R hip. Admitted with osteo. Active w/ [facility] for RN wound management, has wound VAC at home per wife, was using prior to admission, will need VAC after DC. PICC placed today. [facility] aware of HH IV Abx needs and resumption of services for DC today. Patient will require PTAR to home. Wife at bedside concerned about transportation to [facility] follow up on Tuesday. CSW consulted. Patient/ family would be responsible for transportation needs after DC.   Patient's caregiver legally blind. Stating that she cannot administer IV Abx. Jeri Modena RN Infusion Specialist [facility] will assess patient's ability to administer medications to himself 04/13/16. CSW consult placed for SNF placement evaluation if home environment is not supportive for discharge.    Action/Plan:  Spoke with Res. Phys. About what needs to be addressed prior to discharge, and asked for orders/ clarification: Can wound dressing be changed to wet to dry dressing for transport to home? Will need order. HH RN will need to be available to set up wound VAC when patient gets home. Home Health order for RN with instructions for wound care, and lab draws. Rx for abx needed with duration of treatment to provide to [facility] pharmacy to fill. Need to identify caregiver at home to administer IV Abx due to significant other being legally blind.   Expected Discharge Date:                  Expected Discharge Plan:  Home w Home Health Services  In-House Referral:  Clinical Social Work  Discharge planning Services  CM Consult  Post Acute Care Choice:  Home Health, Durable Medical Equipment, Resumption of Svcs/PTA Provider Choice offered to:  Patient, Spouse  DME Arranged:  IV pump/equipment DME Agency:  [facility]  HH Arranged:  RN [facility] Agency:  [facility]  Status of Service:  Completed, signed off  If discussed at Long Length of Stay Meetings, dates discussed:    Additional Comments:  Lawerance Sabal, RN 04/12/2016, 2:24 PM

## 2016-04-13 LAB — VANCOMYCIN, TROUGH: VANCOMYCIN TR: 22 ug/mL — AB (ref 15–20)

## 2016-04-13 MED ORDER — VANCOMYCIN HCL 10 G IV SOLR
1250.0000 mg | Freq: Two times a day (BID) | INTRAVENOUS | Status: DC
  Administered 2016-04-13 – 2016-04-19 (×12): 1250 mg via INTRAVENOUS
  Filled 2016-04-13 (×13): qty 1250

## 2016-04-13 NOTE — Care Management (Signed)
Case manager spoke with Dr. Nelson Chimes and explained that per our Medical Advisor, Dr. Jacky Kindle, we can not  arrange transportation to patient's appointment at [facility] from his home. If patient is medically ready for discharge he would have to secure transportation. CM per Dr. Lanell Matar instruction, advised Dr. Nelson Chimes to contact [facility] administrator, Dyanne Carrel for further direction as this is beyond our scope.

## 2016-04-13 NOTE — Clinical Social Work Note (Signed)
Consult received for possible SNF placement. [facility] hospital liaison shares that the patient will be able to return home at discharge. CSW signing off at this time.   Roddie Mc MSW, [place], [place], [phone]

## 2016-04-13 NOTE — Care Management (Signed)
Case manager was contacted by Dulce Sellar, VP of Nursing and [facility] Administrator on call. We discussed patient and request of patient's attending. Per Dewayne Hatch, Patient will remain inpatient until he has seen physician at [facility]. CM contacted Dr. Nelson Chimes and Dr. Jacky Kindle with the determination.

## 2016-04-13 NOTE — Progress Notes (Signed)
CRITICAL VALUE ALERT  Critical value received:  Vanc level 22  Date of notification:  04/13/16  Time of notification: 1749  Critical value read back:Yes.    Nurse who received alert:  Haig Prophet  MD notified (1st page):  Dr. Nelson Chimes  Time of first page:  [phone]

## 2016-04-13 NOTE — Consult Note (Signed)
WOC Nurse wound follow up Wound type: Stage 4 Pressure Injury Measurement: see previous wound care notes Wound EQA:STMHD, 100% granulation tissue, however can not see base of the wound bed due to tunneling  Drainage (amount, consistency, odor) minimal in VAC canister Periwound:intact  Dressing procedure/placement/frequency: 1pc of black foam used to fill wound and tunneled area. Drape used to protect the skin for bridge to the right hip.   1pc of black foam used for bridge.  Seal obtained at .  Patient tolerated well.  Wife arrived during dressing change, she has brought home VAC in for use at DC.  Bedside nursing staff ok to hook current dressing to home VAC unit at the time of DC.  Wife also brought canister for home unit.  WOC nurse will follow along for complex VAC change with bridging. [place] Steidle Tipton RN,CWOCN [phone]

## 2016-04-13 NOTE — Progress Notes (Signed)
   Subjective: No concerns today. Pt.was told about transportation to [facility], and asked if any of his friend can help. Objective: Vital signs in last 24 hours: Filed Vitals:   04/13/16 0618 04/13/16 0916 04/13/16 1003 04/13/16 1329  BP: 153/75 172/104 169/93 157/77  Pulse: 72  82 84  Temp: 97.3 F (36.3 C)  98.1 F (36.7 C) 97.9 F (36.6 C)  TempSrc: Oral  Oral Oral  Resp: 20  18 18   Height:      Weight:      SpO2: 100%  97% 99%   Physical Exam General: Paraplegic man lying comfortably. No signs of distress. Chest: Clear, No added sound CVS: RRR, No R/G/M. Abd: Soft. There is no tenderness. There is no rebound and no guarding.  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 CNS: Alert, oriented. Paraplegic Extremities: No edema or tenderness, Peripheral pulses +ve.  Assessment/Plan:  Right Hemipelvis Osteomyelitis:He is afebrile since 7/16.Marland Kitchen He needs surgical invention. As [facility]. And general surgery both refused to take pt. over the week end. Pura Spice is trying to get a place at [facility] where he can get optimal care. Dr. Ranae Palms from [facility] wants to see him in his clinic on Tuesday 7/25 at 10:00 am before he can make final decision about his transfer. Dr. Luciana Axe was consulted about his antibiotics and he advised that we can send him home with a picc line and with a slight change in his medicine, D/C Zosyn and start Ceftriaxone 2 gm QHS and continue Vanc.Picc line was inserted yesterday. He has some real problems for being discharged home as his wife is technically blind and cannot administer his IV antibiotics and cannot drive. According to case manager he does not meet criteria for LTAC and apparently not qualified for  SNF. Case Manager wants [place] to contact Hospital Administrator as he no longer meets the hospital criteria for in patient? We are not comfortable with sending him home with out proper arrangement for his out patient [facility] visit  and IV antibiotics, as it's really important for him to get optimal care for his infection which will be fetal without any surgical intervention. We will D/C him after these things are cleared.  HTN: His BP was 169/93, higher then his baseline.Continue with his home meds. Lisinopril-HCTZ 20-25 QH and monitor BP.  Dispo: Anticipated discharge in approximately 2-3day(s).   LOS: 7 days   Arnetha Courser, MD 04/13/2016, 2:25 PM Pager: [phone]

## 2016-04-13 NOTE — Progress Notes (Signed)
[facility]  Patient Status : Active pt with [facility] prior to this admission  [facility] is providing the following services:  HHRN and will add Home Infusion pharmacy team this admission.  [facility] Infusion Coordinator provided hands on teaching today with the pt regarding set up and administration of IV ABX.  Pt did very well. Demonstrated good technique and understanding.  Pt performed full mock run through of Vancomycin and Rocephin. Pt states his wife can still help obtain supplies and meds for him when needed to dose.   [facility] is prepared for weekend DC when ordered.  If patient discharges after hours, please call [phone].   Sedalia Muta 04/13/2016, 12:27 PM

## 2016-04-13 NOTE — Progress Notes (Signed)
Pharmacy Antibiotic Note  Cameron Zavala is a 40 y.o. male admitted on 04/05/2016 with osteomyelitis. Vancomycin trough resulted at 22. This was drawn a little early, true trough likely a little lower. Renal fx is stable, good up, 1.5 yesterday, not displaying any signs of accumulation at this time.   Plan: -Continue vancomycin at 1250 mg IV q12h -Monitor renal fx, uop, check trough again in 4-5 days  Height: 5\' 4"  (162.6 cm) Weight: 134 lb (60.782 kg) IBW/kg (Calculated) : 59.2  Temp (24hrs), Avg:98.3 F (36.8 C), Min:97.3 F (36.3 C), Max:99.6 F (37.6 C)   Recent Labs Lab 04/08/16 0439 04/10/16 0843 04/11/16 0338 04/13/16 1515  CREATININE 0.77 0.74  --   --   VANCOTROUGH  --   --  21* 22*    Estimated Creatinine Clearance: 102.8 mL/min (by C-G formula based on Cr of 0.74).    Allergies  Allergen Reactions  . Baclofen Itching  . Flexeril [Cyclobenzaprine] Other (See Comments)    Extreme tiredness for 24 hours  . Potassium Nausea And Vomiting  . Silver Itching  . Zinc Swelling  . Carbamazepine Hives and Rash  . Chocolate Itching and Rash  . Flour Itching and Rash    Toast, biscuits rolls   . Meat Extract Itching and Rash    Different types of steak  . Other Itching and Rash    Vanilla pudding,  cookies (wafers with white cream in the middle), microwave meals  . Peanut-Containing Drug Products Itching and Rash    Red lips  . Pumpkin Seed Itching and Rash  . Rosuvastatin Calcium Itching and Rash  . Statins Itching and Rash  . Sulfonamide Derivatives Itching and Rash  . Sunflower Seed [Sunflower Oil] Itching and Rash  . Tape Itching and Rash    Plastic tape.  Can only use paper tape.      Agapito Games, PharmD, BCPS Clinical Pharmacist 04/13/2016 7:20 PM

## 2016-04-14 NOTE — Progress Notes (Signed)
   Subjective: No concerns today. Pt. Was told about our plans to contact [facility]. 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 No signs of distress. Chest: Clear, No added sound CVS: RRR, No R/G/M. Abd: Soft. There is no tenderness. There is no rebound and no guarding.  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 CNS: Alert, oriented. Paraplegic Extremities: No edema or tenderness, Peripheral pulses +ve.  Assessment/Plan:  Right Hemipelvis Osteomyelitis:He is afebrile since 7/16.Marland Kitchen He needs surgical invention. As [facility]. And general surgery both refused to take pt. over the week end. Pura Spice is trying to get a place at [facility] where he can get optimal care. Dr. Ranae Palms from [facility] wants to see him in his clinic on Tuesday 7/25 at 10:00 am before he can make final decision about his transfer. Dr. Luciana Axe was consulted about his antibiotics and he advised that we can send him home with a picc line and with a slight change in his medicine, D/C Zosyn and start Ceftriaxone 2 gm QHS and continue Vanc.Picc line was inserted yesterday. He has some real problems for being discharged home as his wife is technically blind and cannot administer his IV antibiotics and cannot drive. According to case manager he does not meet criteria for LTAC and apparently not qualified for SNF. Case Manager and hospital administer was contacted and we were told that hospital cannot send him for his out patient appointment. We will keep him in patient and try to contact [facility] if they can provide him the care he needs and  agrees for his transfer.   HTN: His BP was 141/77.Continue with his home meds. Lisinopril-HCTZ 20-25 QH and monitor BP.  Dispo: Anticipated discharge in approximately 2-3 day(s).   LOS: 8 days   Arnetha Courser, MD 04/14/2016, 12:06 PM Pager: [phone]

## 2016-04-15 NOTE — Progress Notes (Signed)
   Subjective: No acute events overnight. He has no complaints today. We discussed again our plans to call [facility] tomorrow. He has relayed this message to his wife.   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 Neuro: Alert and oriented x 3. Paraplegic.  Ext: no edema   Assessment/Plan:  Right Hemipelvis Osteomyelitis: He remains afebrile. PICC line inserted 04/12/16. He is on Ceftriaxone 2g daily and Vancomycin 1250 mg Q12H. He will need surgical intervention (right hemipelvectomy) to treat his osteomyelitis. Transfer to a hospital that can provide this surgery has been difficult. [facility] declined his admission. [facility] did not want to directly admit him and wanted an outpatient evaluation first. However, we were unable to set up transportation to [facility] outpatient orthopedic surgery evaluation appointment. Difficult situation as his wife is legally blind and cannot provide transportation for him. We will call [facility] Orthopedic surgery tomorrow and see if they would be willing to accept this patient. We appreciate everyone's help in this patient's care, including social work and case management.   HTN: BP stable. Continue with his home med Lisinopril-HCTZ 20-25 daily.    Diet: Heart healthy  DVT PPx: Lovenox SQ Dispo: Anticipated discharge in approximately 2-3 day(s). Working on transferring him to another hospital facility as he requires surgical intervention for his osteomyelitis  that we are unable to offer him here.   LOS: 9 days   Su Hoff, MD 04/15/2016, 12:07 PM Pager: [phone]

## 2016-04-16 MED ORDER — LISINOPRIL 20 MG PO TABS
40.0000 mg | ORAL_TABLET | Freq: Every day | ORAL | Status: DC
  Administered 2016-04-17 – 2016-04-26 (×10): 40 mg via ORAL
  Filled 2016-04-16 (×10): qty 2

## 2016-04-16 MED ORDER — COLLAGENASE 250 UNIT/GM EX OINT
TOPICAL_OINTMENT | Freq: Every day | CUTANEOUS | Status: DC
  Administered 2016-04-16 – 2016-04-26 (×8): via TOPICAL
  Filled 2016-04-16: qty 30

## 2016-04-16 MED ORDER — HYDROCHLOROTHIAZIDE 25 MG PO TABS
25.0000 mg | ORAL_TABLET | Freq: Every day | ORAL | Status: DC
  Administered 2016-04-17 – 2016-04-26 (×10): 25 mg via ORAL
  Filled 2016-04-16 (×10): qty 1

## 2016-04-16 NOTE — Progress Notes (Signed)
   Subjective: No concerns today. Pt. Was told about our plans to contact [facility].Need to be bathe. Objective: Vital signs in last 24 hours: Vitals:   04/16/16 0131 04/16/16 0514 04/16/16 0523 04/16/16 0925  BP: 139/68  (!) 147/80 (!) 165/100  Pulse: 97  87 91  Resp: 18  18 20   Temp: 98.9 F (37.2 C) 98.7 F (37.1 C) 98.3 F (36.8 C) 97.9 F (36.6 C)  TempSrc: Oral  Oral Axillary  SpO2: 98%  99% 98%  Weight:      Height:       Physical Exam General: Paraplegic man lying comfortably. No signs of distress. Chest: Clear, No added sound CVS: RRR, No R/G/M. Abd: Soft. There is no tenderness. There is no rebound and no guarding.  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 CNS: Alert, oriented. Paraplegic Extremities: No edema or tenderness, Peripheral pulses +ve.   Assessment/Plan:  Right Hemipelvis Osteomyelitis:He is afebrile since 7/16.Marland Kitchen He needs surgical invention. As [facility]. And general surgery both refused to take pt. over the week end.Dr. Josem Kaufmann is trying to get a place at [facility] now where he can get optimal care. They are looking at his case now and will let us know once make any decision. We will keep him in patient till [facility] can decide on his transfer.  HTN: His BP was high on occasions. .Increase  Lisinopril- to 40mg  and keepHCTZ 25 QH and monitor BP.  Dispo: Anticipated discharge in approximately 2-3day(s).   LOS: 10 days   Arnetha Courser, MD 04/16/2016, 11:57 AM Pager: [phone]

## 2016-04-16 NOTE — Progress Notes (Addendum)
Per Dr. Nelson Chimes, there will no [facility] transfer for outpatient evaluation. MD(s) will attempt to obtained consult from [facility] instead. Patient is aware of update at this time.MD will update nursing staffs on POC.   Sim Boast, RN

## 2016-04-16 NOTE — Consult Note (Signed)
WOC Nurse wound follow up Wound type: Stage 4 Pressure Injury right ischium Unstageable Pressure Injury sacrum Stage 2 Pressure INjury left trochanter  Measurement: Right  ischium: 1.5cm x 2.0cm x 1.5cm with tunnel at 1 o'clock that is 4cm; clean, pink, unable to visualize wound bed base.  Sacrum: now one wound 5cm x 4.5cm x 0.2cm; that has more slough 95% yelllow/grey Left trochanter: clean, pink; 80% pink/20% yellow  Wound bed: see above Drainage (amount, consistency, odor) minimal serous from the ischial wound in the VAC canister Minimal from the sacral wound Minimal from the left trochanter Periwound: intact  Dressing procedure/placement/frequency: Add enzymatic debridement ointment to the sacral wound, it has evolved and has more slough now. 1pc of black foam used to fill the right ischial wound, skin protected to the right hip, 1pc of black foam used to bridge the dressing/TRAC pad to the right hip. 1/4 pc of ostomy barrier ring used to flatten crease distal to the wound bed, covered with VAC drape and sealed at .  Pt tolerated well.  Encouraged maximized nutrition, protein for wound healing.  Continue LALM for pressure redistribution.  WOC Nurse ostomy follow up Stoma type/location: LLQ Stomal assessment/size: pouch changed per patient yesterday Continues to report some denudation just around the stoma, ordered ostomy powder for patient today. Educated on use. Ostomy pouching: 1pc. 3 pouches ordered, ostomy powder ordered  WOC team will follow along for wound and ostomy care/support Katieann Hungate Eliberto Ivory RN,CWOCN [phone]

## 2016-04-16 NOTE — Progress Notes (Signed)
Rickie from [facility] will be prepared a transport pump to [facility] if pt is going. [phone]. Please call Rickie at this number if pt is definitely going to staff can switch out pump. Raelyn Mora, RN

## 2016-04-16 NOTE — Progress Notes (Signed)
Sacral wound change per wound nurse. Apply with santyl, 2x2 gauze with saline and covered with abd. Dressing clean, dry, and intact.

## 2016-04-17 LAB — BASIC METABOLIC PANEL
ANION GAP: 8 (ref 5–15)
BUN: 22 mg/dL — ABNORMAL HIGH (ref 6–20)
CALCIUM: 9 mg/dL (ref 8.9–10.3)
CO2: 27 mmol/L (ref 22–32)
CREATININE: 0.69 mg/dL (ref 0.61–1.24)
Chloride: 101 mmol/L (ref 101–111)
Glucose, Bld: 101 mg/dL — ABNORMAL HIGH (ref 65–99)
Potassium: 4.2 mmol/L (ref 3.5–5.1)
SODIUM: 136 mmol/L (ref 135–145)

## 2016-04-17 LAB — CBC
HCT: 33.4 % — ABNORMAL LOW (ref 39.0–52.0)
HEMOGLOBIN: 10.3 g/dL — AB (ref 13.0–17.0)
MCH: 24.5 pg — ABNORMAL LOW (ref 26.0–34.0)
MCHC: 30.8 g/dL (ref 30.0–36.0)
MCV: 79.3 fL (ref 78.0–100.0)
PLATELETS: 515 10*3/uL — AB (ref 150–400)
RBC: 4.21 MIL/uL — AB (ref 4.22–5.81)
RDW: 16.7 % — ABNORMAL HIGH (ref 11.5–15.5)
WBC: 8.6 10*3/uL (ref 4.0–10.5)

## 2016-04-17 MED ORDER — OXYCODONE-ACETAMINOPHEN 5-325 MG PO TABS: 1.0000 | ORAL_TABLET | ORAL | Status: DC | PRN

## 2016-04-17 NOTE — Care Management Note (Signed)
Case Management Note  Patient Details  Name: Cameron Zavala MRN: [id number] Date of Birth: 1976-06-29  Subjective/Objective:                    Action/Plan: Plan is to transfer patient to [facility] hospital when bed is available. CM continuing to follow for discharge needs.   Expected Discharge Date:                  Expected Discharge Plan:  Home w Home Health Services  In-House Referral:  Clinical Social Work  Discharge planning Services  CM Consult  Post Acute Care Choice:  Home Health, Durable Medical Equipment, Resumption of Svcs/PTA Provider Choice offered to:  Patient, Spouse  DME Arranged:  IV pump/equipment DME Agency:  [facility]  HH Arranged:  RN [facility] Agency:  [facility]  Status of Service:  Completed, signed off  If discussed at Long Length of Stay Meetings, dates discussed:    Additional Comments:  Kermit Balo, RN 04/17/2016, 10:58 AM

## 2016-04-17 NOTE — Progress Notes (Signed)
   Subjective: No concerns. Waiting for bed availability at [facility].   Objective:  Vital signs in last 24 hours: Vitals:   04/17/16 0135 04/17/16 0552 04/17/16 1039 04/17/16 1043  BP: 139/68 117/72 (!) 143/67 (!) 143/67  Pulse: 89 82  98  Resp: 16 18  20   Temp: 98.5 F (36.9 C) 98 F (36.7 C)  98.6 F (37 C)  TempSrc: Oral Oral  Oral  SpO2: 99% 99%  98%  Weight:      Height:       Physical Exam General:Paraplegic man lying comfortably. No signs of distress. Chest: Clear, No added sound CVS: RRR, No R/G/M. Abd: Soft. There is no tenderness. There is no rebound and no guarding.  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 CNS: Alert, oriented. Paraplegic Extremities: No edema or tenderness, Peripheral pulses +ve.  Labs: BMP Latest Ref Rng & Units 04/17/2016 04/10/2016 04/08/2016  Glucose 65 - 99 mg/dL 917(H) 150(V) 697(X)  BUN 6 - 20 mg/dL 48(A) 10 7  Creatinine 0.61 - 1.24 mg/dL 1.65 5.37 4.82  BUN/Creat Ratio 8 - 19 - - -  Sodium 135 - 145 mmol/L 136 134(L) 134(L)  Potassium 3.5 - 5.1 mmol/L 4.2 4.3 3.6  Chloride 101 - 111 mmol/L 101 100(L) 98(L)  CO2 22 - 32 mmol/L 27 27 27   Calcium 8.9 - 10.3 mg/dL 9.0 9.1 8.9   CBC Latest Ref Rng & Units 04/17/2016 04/06/2016 04/05/2016  WBC 4.0 - 10.5 K/uL 8.6 9.0 10.3  Hemoglobin 13.0 - 17.0 g/dL 10.3(L) 9.3(L) 11.1(L)  Hematocrit 39.0 - 52.0 % 33.4(L) 29.4(L) 35.3(L)  Platelets 150 - 400 K/uL 515(H) 246 279    Assessment/Plan:  Right Hemipelvis Osteomyelitis:He is afebrile since 7/16.Marland Kitchen He needs surgical invention. He is being accepted at [facility] IM  Service with consult to ortho. And plastic surgery. Waiting for the bed availability. His DG and CT images has been Fedex to [facility]. He will come back to our service once they complete their evaluation and treatment. Continue with cefriaxone and Vanc.  HTN: His BP was high on occasions, we increased his lisinopril to 40 yesterday. His BP today was  117/72. Continue with HCTZ 25 QH and monitor BP.  Dispo: Anticipated discharge in approximately 1-2 day(s).    LOS: 11 days   Arnetha Courser, MD 04/17/2016, 11:45 AM Pager: [phone]

## 2016-04-17 NOTE — Progress Notes (Signed)
Pharmacy Antibiotic Note  Cameron Zavala is a 40 y.o. male admitted on 04/05/2016 with osteomyelitis.  Pharmacy has been consulted for Vancomycin dosing.  Currently planning 4 weeks of antibiotics. Start date 04/05/16.  Day # 13 Vanc and # 6 Ceftriaxone (after 7 days of Zosyn).  Creatinine stable, but BUN has trended up to 22.  Afebrile, WBC 8.6.  Plan:   Continue Vancomycin 1250 mg IV q12hrs.   Weekly Vanc trough due on 04/20/16, but will check prior to 10am dose on 7/26.  For transfer to [facility] when bed available.   Target troughs 15-20 mcg/ml.   Bmet ~ every 3-4 days (twice a week) while on Vanc.  Height: 5\' 4"  (162.6 cm) Weight: 134 lb (60.8 kg) IBW/kg (Calculated) : 59.2  Temp (24hrs), Avg:98.9 F (37.2 C), Min:98 F (36.7 C), Max:99.8 F (37.7 C)   Recent Labs Lab 04/11/16 0338 04/13/16 1515 04/17/16 0441  WBC  --   --  8.6  CREATININE  --   --  0.69  VANCOTROUGH 21* 22*  --     Estimated Creatinine Clearance: 102.8 mL/min (by C-G formula based on SCr of 0.8 mg/dL).    Allergies  Allergen Reactions  . Baclofen Itching  . Flexeril [Cyclobenzaprine] Other (See Comments)    Extreme tiredness for 24 hours  . Potassium Nausea And Vomiting  . Silver Itching  . Zinc Swelling  . Carbamazepine Hives and Rash  . Chocolate Itching and Rash  . Flour Itching and Rash    Toast, biscuits rolls   . Meat Extract Itching and Rash    Different types of steak  . Other Itching and Rash    Vanilla pudding,  cookies (wafers with white cream in the middle), microwave meals  . Peanut-Containing Drug Products Itching and Rash    Red lips  . Pumpkin Seed Itching and Rash  . Rosuvastatin Calcium Itching and Rash  . Statins Itching and Rash  . Sulfonamide Derivatives Itching and Rash  . Sunflower Seed [Sunflower Oil] Itching and Rash  . Tape Itching and Rash    Plastic tape.  Can only use paper tape.    Antimicrobials this admission: Vanc 7/13>>    7/15 VT= 10 on Vanc 750 mg  IV q12h, dose increased to 1250 mg    7/19 VT= 21 (drawn slightly early, likely ~20), continue vanc 1250 mg IV q12h    7/21 VT = 22 (drawn early), continue same dose Zosyn 7/13>>7/19 Ceftriaxone 7/19>  Dose adjustments this admission:  7/15: increased from 750 mg IV q12h to 1250 mg IV q12h due to low trough level of 10 mcg/ml  Microbiology results:  7/13 blood x 2 - negative  7/13 urine - negative  Cameron Zavala, [place] Pager: [phone] 04/17/2016 2:53 PM
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
## 2016-04-18 ENCOUNTER — Encounter (HOSPITAL_COMMUNITY): Payer: Self-pay

## 2016-04-18 ENCOUNTER — Ambulatory Visit: Payer: No Typology Code available for payment source

## 2016-04-18 ENCOUNTER — Encounter (HOSPITAL_BASED_OUTPATIENT_CLINIC_OR_DEPARTMENT_OTHER): Payer: No Typology Code available for payment source | Attending: Surgery

## 2016-04-18 NOTE — Consult Note (Signed)
WOC Nurse wound follow up Wound type: Stage 4 Pressure Injury right ischium Unstageable Pressure Injury sacrum Stage 2 Pressure INjury left trochanter  Patient seen only for NPWT VAC dressing change to the right ischium today by WOC nurse Measurement: Right  ischium: 1.5cm x 2.0cm x 1.5cm with tunnel at 1 o'clock that is 3.5 cm; clean, pink, unable to visualize wound bed base.  Wound bed: see above Drainage (amount, consistency, odor) minimal serous from the ischial wound in the VAC canister Periwound: intact  Dressing procedure/placement/frequency: 1pc of black foam used to fill the right ischial wound, skin protected to the right hip, 1pc of black foam used to bridge the dressing/TRAC pad to the right hip. 1/4 pc of ostomy barrier ring used to flatten crease distal to the wound bed, covered with VAC drape and sealed at .  Pt tolerated well.   Continue LALM for pressure redistribution. Patient will need LALM at home.  WOC nurse team will follow along with you for continued NPWT VAC dressing changes due to complexity of dressing with bridging. Weekly wound assessments of the left trochanter and sacrum.   Prakriti Carignan [place], [place] [phone]

## 2016-04-18 NOTE — Progress Notes (Signed)
   Subjective:  No concerns. Waiting for bed availability at [facility].Wound care team was in the room.  Objective:  Vital signs in last 24 hours: Vitals:   04/18/16 0145 04/18/16 0700 04/18/16 0958 04/18/16 1404  BP: (!) 148/79 137/88 (!) 160/88 (!) 157/70  Pulse: (!) 102 91 84 76  Resp: 20 20 18 18   Temp: 99.4 F (37.4 C) 98.2 F (36.8 C) 98.4 F (36.9 C) 98.9 F (37.2 C)  TempSrc: Oral Oral Oral Oral  SpO2: 98% 100% 100% 99%  Weight:      Height:       Physical Exam General:Paraplegic man lying comfortably. No signs of distress. Chest: Clear, No added sound CVS: RRR, No R/G/M. Abd: Soft. There is no tenderness. There is no rebound and no guarding.  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 CNS: Alert, oriented. Paraplegic Extremities: No edema or tenderness, Peripheral pulses +ve.  Assessment/Plan:  Right Hemipelvis Osteomyelitis:He is afebrile since 7/16.Marland Kitchen He needs surgical invention. He is being accepted at [facility] IM  Service with consult to ortho. And plastic surgery. Waiting for the bed availability. His DG and CT images has been Fedex to [facility]. He will come back to our service once they complete their evaluation and treatment. Continue with cefriaxone and Vanc.  HTN: His BP was high on occasions, we increased his lisinopril to 40 . His BP today was 137/88. Continue with HCTZ 25 QH and monitor BP.  Dispo: Anticipated discharge in approximately 1-2day(s).   LOS: 12 days   Arnetha Courser, MD 04/18/2016, 5:46 PM Pager: [phone]

## 2016-04-19 LAB — VANCOMYCIN, TROUGH: Vancomycin Tr: 23 ug/mL (ref 15–20)

## 2016-04-19 MED ORDER — VANCOMYCIN HCL IN DEXTROSE 1-5 GM/200ML-% IV SOLN
1000.0000 mg | Freq: Two times a day (BID) | INTRAVENOUS | Status: DC
Start: 2016-04-19 — End: 2016-04-22
  Administered 2016-04-20 – 2016-04-21 (×5): 1000 mg via INTRAVENOUS
  Filled 2016-04-19 (×7): qty 200

## 2016-04-19 NOTE — Progress Notes (Signed)
Pharmacy Antibiotic Note  Cameron Zavala is a 40 y.o. male admitted on 04/05/2016 with osteomyelitis.  Pharmacy has been consulted for Vancomycin dosing. Goal trough 15-20 mcg/mL. Vancomycin trough drawn appropriately and slightly supratherapeutic at 23 mcg/mL. Renal function stable and WBC wnl. Will adjust dose.    Plan: Decrease Vancomycin  to 1000 mg IV Q12H  Continue Rocephin 2 grams IV q24hrs  BMP every 3-4 days, ordered for 7/28 Vancomycin troughs as needed  Continue to monitor clinical picture and renal function   Height: 5\' 4"  (162.6 cm) Weight: 134 lb (60.8 kg) IBW/kg (Calculated) : 59.2  Temp (24hrs), Avg:98.3 F (36.8 C), Min:97.8 F (36.6 C), Max:98.9 F (37.2 C)   Recent Labs Lab 04/13/16 1515 04/17/16 0441 04/19/16 1000  WBC  --  8.6  --   CREATININE  --  0.69  --   VANCOTROUGH 22*  --  23*    Estimated Creatinine Clearance: 102.8 mL/min (by C-G formula based on SCr of 0.8 mg/dL).    Allergies  Allergen Reactions  . Baclofen Itching  . Flexeril [Cyclobenzaprine] Other (See Comments)    Extreme tiredness for 24 hours  . Potassium Nausea And Vomiting  . Silver Itching  . Zinc Swelling  . Carbamazepine Hives and Rash  . Chocolate Itching and Rash  . Flour Itching and Rash    Toast, biscuits rolls   . Meat Extract Itching and Rash    Different types of steak  . Other Itching and Rash    Vanilla pudding,  cookies (wafers with white cream in the middle), microwave meals  . Peanut-Containing Drug Products Itching and Rash    Red lips  . Pumpkin Seed Itching and Rash  . Rosuvastatin Calcium Itching and Rash  . Statins Itching and Rash  . Sulfonamide Derivatives Itching and Rash  . Sunflower Seed [Sunflower Oil] Itching and Rash  . Tape Itching and Rash    Plastic tape.  Can only use paper tape.    Antimicrobials and dose adjustments this admission: Vanc 7/13>>    7/15 VT= 10; change to 1250 mg IV q12h    7/19 VT= 21 (drawn slightly early, likely  ~20), continue same dose     7/21 VT = 22 (drawn early), continue same dose    7/26: VT ordered for 0930, but missed    7/27 VT = 23; change vancomycin to 1000mg  IV q12h Zosyn 7/13>>7/19 Rocephin 7/19>  Microbiology results: 7/13 Blood x 2 - negative 7/13 Urine - negative  Thank you for allowing pharmacy to be a part of this patient's care.  Cherre Huger, PharmD Pharmacy Resident 04/19/2016 11:06 AM  Pager [phone]

## 2016-04-19 NOTE — Progress Notes (Signed)
   Subjective: No concerns. Waiting for bed availability at [facility].  Objective:  Vital signs in last 24 hours: Vitals:   04/18/16 2143 04/19/16 0105 04/19/16 0516 04/19/16 1014  BP: (!) 167/86 (!) 157/79 (!) 161/77 140/89  Pulse: 83 79 82 78  Resp: 20 20 20 18   Temp: 98 F (36.7 C) 98.3 F (36.8 C) 98.1 F (36.7 C) 98.6 F (37 C)  TempSrc: Oral Oral Oral Oral  SpO2: 100% 100% 100% 100%  Weight:      Height:       Physical Exam General:Paraplegic man lying comfortably. No signs of distress. Chest: Clear, No added sound CVS: RRR, No R/G/M. Abd: Soft. There is no tenderness. There is no rebound and no guarding.  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 CNS: Alert, oriented. Paraplegic Extremities: No edema or tenderness, Peripheral pulses +ve.   Assessment/Plan:  Right Hemipelvis Osteomyelitis:He is afebrile since 7/16.Marland Kitchen He needs surgical invention. He is being accepted at [facility] IM [facility] with consult to ortho. And plastic surgery. Waiting for the bed availability. His DG and CT images has been Fedex to [facility]. He will come back to our service once they complete their evaluation and treatment. Continue with cefriaxone and Vanc. His vanc. Levels were supra therapeutic today, pharmacy is adjusting the dose.  HTN: His BP was high on occasions, we increased his lisinopril to 40 . His most recent BP is 140/89. Continue with HCTZ 25 QH and monitor BP.  Dispo: Anticipated discharge in approximately 1-2 day(s).    LOS: 13 days   Arnetha Courser, MD 04/19/2016, 1:56 PM Pager: [phone]

## 2016-04-20 DIAGNOSIS — L309 Dermatitis, unspecified: Secondary | ICD-10-CM

## 2016-04-20 LAB — BASIC METABOLIC PANEL
ANION GAP: 10 (ref 5–15)
BUN: 14 mg/dL (ref 6–20)
CALCIUM: 9.3 mg/dL (ref 8.9–10.3)
CHLORIDE: 95 mmol/L — AB (ref 101–111)
CO2: 28 mmol/L (ref 22–32)
CREATININE: 0.62 mg/dL (ref 0.61–1.24)
GFR calc Af Amer: >60 mL/min (ref >60)
GFR calc non Af Amer: >60 mL/min (ref >60)
GLUCOSE: 103 mg/dL — AB (ref 65–99)
Potassium: 4.3 mmol/L (ref 3.5–5.1)
SODIUM: 133 mmol/L — AB (ref 135–145)

## 2016-04-20 MED ORDER — HYDROCORTISONE 1 % EX CREA
TOPICAL_CREAM | Freq: Three times a day (TID) | CUTANEOUS | Status: DC | PRN
  Administered 2016-04-21 – 2016-04-24 (×2): via TOPICAL
  Filled 2016-04-20: qty 28

## 2016-04-20 NOTE — Consult Note (Addendum)
WOC Nurse wound follow up Wound type: Stage 4 Pressure Injury right ischium Patient seen only for NPWT VAC dressing change to the right ischium today by WOC nurse No change in the measurements and wound appearance since previous assessment. Drainage (amount, consistency, odor) minimal serous from the ischial wound in the VAC canister Periwound: intact  Dressing procedure/placement/frequency: 1pc of black foam used to fill the right ischial wound, skin protected to the right hip, 1pc of black foam used to bridge the dressing/TRAC pad to the right hip. 1/2 pc of ostomy barrier ring used to flatten crease distal to the wound bed, covered with VAC drape and sealed at . Pt tolerated well without discomfort.  Pt's home Vac machine at bedside ready for use upon discharge.  WOC nurse team will follow along with you for continued NPWT VAC dressing changes Q M/W/F during admission due to complexity of dressing with bridging. Weekly wound assessments of the left trochanter and sacrum.  Thank-you,  Cammie Mcgee MSN, RN, CWOCN, [place], CNS [phone]

## 2016-04-20 NOTE — Progress Notes (Signed)
   Subjective: C/O itching around his left ear. No other concerns.Waiting for bed availability at [facility]  Objective:  Vital signs in last 24 hours: Vitals:   04/19/16 2151 04/20/16 0144 04/20/16 0545 04/20/16 0917  BP: (!) 142/66 137/68 125/66 (!) 152/87  Pulse: 98 97 90 88  Resp: 18 18 18 20   Temp: 98.8 F (37.1 C) 98.8 F (37.1 C) 98.7 F (37.1 C) 98.6 F (37 C)  TempSrc: Oral Oral Oral Oral  SpO2: 99% 98% 98% 98%  Weight:      Height:       Physical Exam General:Paraplegic man lying comfortably. No signs of distress. Have some eczema around his left ear. Chest: Clear, No added sound CVS: RRR, No R/G/M. Abd: Soft. There is no tenderness. There is no rebound and no guarding.  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 CNS: Alert, oriented. Paraplegic Extremities: No edema or tenderness, Peripheral pulses +ve.   Assessment/Plan:  Right Hemipelvis Osteomyelitis:He is afebrile since 7/16.Marland Kitchen He needs surgical invention. He is being accepted at [facility] with consult to ortho. And plastic surgery. Waiting for the bed availability. His DG and CT images has been Fedex to [facility]. He will come back to our service once they complete their evaluation and treatment. Continue with cefriaxone and Vanc. His vanc. Levels were supra therapeutic today, pharmacy is adjusting the dose.  HTN: His BP was high on occasions, we increased his lisinopril to 40 . His most recent BP is 140/89. Continue with HCTZ 25 QH and monitor BP.  Eczema: We are giving him some hydrocortisone cream 1% for itching.  Dispo: Anticipated discharge in approximately 1-2 day(s).   LOS: 14 days   Arnetha Courser, MD 04/20/2016, 11:57 AM Pager: [phone]

## 2016-04-20 NOTE — Progress Notes (Signed)
RN spoke with Lyda Perone with [facility], Patient is stable and still in need of bed placement.

## 2016-04-20 NOTE — Care Management Note (Signed)
Case Management Note  Patient Details  Name: Cameron Zavala MRN: [id number] Date of Birth: [birth year]  Subjective/Objective:                    Action/Plan: Pt continues on IV abx. Waiting on bed availability at [facility]. CM following for further d/c needs.   Expected Discharge Date:                  Expected Discharge Plan:  Home w Home Health Services  In-House Referral:  Clinical Social Work  Discharge planning Services  CM Consult  Post Acute Care Choice:  Home Health, Durable Medical Equipment, Resumption of Svcs/PTA Provider Choice offered to:  Patient, Spouse  DME Arranged:  IV pump/equipment DME Agency:  [facility]  HH Arranged:  RN [facility] Agency:  [facility]  Status of Service:  Completed, signed off  If discussed at Long Length of Stay Meetings, dates discussed:    Additional Comments:  Kermit Balo, RN 04/20/2016, 2:57 PM

## 2016-04-21 NOTE — Progress Notes (Signed)
Received message from Keisha([phone]) of [facility], asking if Patient is still transferring? Cameron Zavala was called back on the phone number above. She said she was just calling to ask if he was still interested in transferring so that she can update her wait list. Confirm that patient was still interested.

## 2016-04-21 NOTE — Progress Notes (Signed)
   Subjective: No  concerns.Waiting for bed availability at [facility]. Pt. States he never got cream for itching which was ordered yesterday.  Objective:  Vital signs in last 24 hours: Vitals:   04/20/16 1831 04/20/16 2157 04/21/16 0157 04/21/16 0604  BP: 122/79 132/65 (!) 115/49 117/66  Pulse: (!) 108 (!) 112 (!) 111 82  Resp: 20 20 20 18   Temp: 99.2 F (37.3 C) 99.2 F (37.3 C) 99.4 F (37.4 C) 98.2 F (36.8 C)  TempSrc: Oral Oral Oral Oral  SpO2: 97% 98% 94% 100%  Weight:      Height:       Physical Exam General:Paraplegic man lying comfortably. No signs of distress. Have some eczema around his left ear. Chest: Clear, No added sound CVS: RRR, No R/G/M. Abd: Soft. There is no tenderness. There is no rebound and no guarding.  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 CNS: Alert, oriented. Paraplegic Extremities: No edema or tenderness, Peripheral pulses +ve.  Assessment/Plan:  Right Hemipelvis Osteomyelitis:He is afebrile since 7/16.Marland Kitchen He needs surgical invention. He is being accepted at [facility] IM [facility] with consult to ortho. And plastic surgery. Waiting for the bed availability. His DG and CT images has been Fedex to [facility]. He will come back to our service once they complete their evaluation and treatment. Continue with cefriaxone and Vanc.   HTN: His BP was high on occasions, we increased his lisinopril to 40 . His most recent BP is 117/66. Continue with HCTZ 25 QH and monitor BP.  Eczema: We gave him some hydrocortisone cream 1% for itching. Pt. States he never got any? Will clarify from nurse.  Dispo: Anticipated discharge in approximately 1-2 day(s). Waiting for bed availability at [facility].   LOS: 15 days   Arnetha Courser, MD 04/21/2016, 9:02 AM Pager: [phone]

## 2016-04-22 LAB — BASIC METABOLIC PANEL
ANION GAP: 8 (ref 5–15)
BUN: 32 mg/dL — ABNORMAL HIGH (ref 6–20)
CALCIUM: 9.3 mg/dL (ref 8.9–10.3)
CO2: 30 mmol/L (ref 22–32)
CREATININE: 0.9 mg/dL (ref 0.61–1.24)
Chloride: 97 mmol/L — ABNORMAL LOW (ref 101–111)
Glucose, Bld: 119 mg/dL — ABNORMAL HIGH (ref 65–99)
Potassium: 4.2 mmol/L (ref 3.5–5.1)
SODIUM: 135 mmol/L (ref 135–145)

## 2016-04-22 LAB — VANCOMYCIN, TROUGH: Vancomycin Tr: 23 ug/mL (ref 15–20)

## 2016-04-22 MED ORDER — VANCOMYCIN HCL IN DEXTROSE 750-5 MG/150ML-% IV SOLN
750.0000 mg | Freq: Two times a day (BID) | INTRAVENOUS | Status: DC
  Administered 2016-04-22 – 2016-04-26 (×9): 750 mg via INTRAVENOUS
  Filled 2016-04-22 (×12): qty 150

## 2016-04-22 NOTE — Progress Notes (Signed)
Ostomy pouch  is replaced as the one in place is leaking. New pouches are re-ordered [facility] [place] [phone]) fro future use.

## 2016-04-22 NOTE — Progress Notes (Signed)
Pharmacy Antibiotic Note  Marguis Zavala is a 40 y.o. male admitted on 04/05/2016 with osteomyelitis.  Pharmacy has been consulted for Vancomycin dosing. Goal trough 15-20 mcg/mL. Vancomycin trough drawn appropriately and slightly supratherapeutic at 23 mcg/mL. Slight bump in SCr 0.6 > 0.9, but still appears to be at baseline, good uop.    Plan: Decrease Vancomycin  to 750 mg IV q12h  Continue Rocephin 2 grams IV q24h  BMP q 3 days VT at [facility] Continue to monitor clinical picture and renal function    Height: 5\' 4"  (162.6 cm) Weight: 134 lb (60.8 kg) IBW/kg (Calculated) : 59.2  Temp (24hrs), Avg:98.6 F (37 C), Min:98.3 F (36.8 C), Max:98.8 F (37.1 C)   Recent Labs Lab 04/17/16 0441 04/19/16 1000 04/20/16 0400 04/22/16 0445 04/22/16 1140  WBC 8.6  --   --   --   --   CREATININE 0.69  --  0.62 0.90  --   VANCOTROUGH  --  23*  --   --  23*    Estimated Creatinine Clearance: 91.4 mL/min (by C-G formula based on SCr of 0.9 mg/dL).    Allergies  Allergen Reactions  . Baclofen Itching  . Flexeril [Cyclobenzaprine] Other (See Comments)    Extreme tiredness for 24 hours  . Potassium Nausea And Vomiting  . Silver Itching  . Zinc Swelling  . Carbamazepine Hives and Rash  . Chocolate Itching and Rash  . Flour Itching and Rash    Toast, biscuits rolls   . Meat Extract Itching and Rash    Different types of steak  . Other Itching and Rash    Vanilla pudding,  cookies (wafers with white cream in the middle), microwave meals  . Peanut-Containing Drug Products Itching and Rash    Red lips  . Pumpkin Seed Itching and Rash  . Rosuvastatin Calcium Itching and Rash  . Statins Itching and Rash  . Sulfonamide Derivatives Itching and Rash  . Sunflower Seed [Sunflower Oil] Itching and Rash  . Tape Itching and Rash    Plastic tape.  Can only use paper tape.    Antimicrobials and dose adjustments this admission: Vanc 7/13>>    7/15 VT= 10; change to 1250 mg IV q12h    7/19  VT= 21 (drawn slightly early, likely ~20), continue same dose     7/21 VT = 22 (drawn early), continue same dose    7/27 VT = 23; change vancomycin to 1000mg  IV q12h    7/30 VT = 23 Zosyn 7/13>>7/19 Rocephin 7/19>   Microbiology results: 7/13 Blood x 2 - negative 7/13 Urine - negative    Cameron Zavala, PharmD, BCPS Clinical Pharmacist 04/22/2016 1:40 PM

## 2016-04-22 NOTE — Progress Notes (Signed)
Vanc trough of 23 received from lab, informed MD. MD instructions to inform pharmacist of trough level. Pharmacist is informed. Pharmacist states that [place] dosing will be recalculated. Will continue to monitor

## 2016-04-22 NOTE — Progress Notes (Signed)
   Subjective: No  concerns.Waiting for bed availability at [facility]. His itching is better since he start applying hydrocortisone cream.  Objective:  Vital signs in last 24 hours: Vitals:   04/21/16 2154 04/22/16 0100 04/22/16 0640 04/22/16 0952  BP: 138/80 (!) 144/82 136/82 (!) 162/91  Pulse: 88 94 82 90  Resp: 19 19 18 16   Temp: 98.5 F (36.9 C) 98.3 F (36.8 C) 98.6 F (37 C) 98.8 F (37.1 C)  TempSrc: Oral Oral Oral Oral  SpO2: 99% 98% 98% 99%  Weight:      Height:       Physical Exam General:Paraplegic man lying comfortably. No signs of distress. Have some eczema around his left ear. Chest: Clear, No added sound CVS: RRR, No R/G/M. Abd: Soft. There is no tenderness. There is no rebound and no guarding.  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 CNS: Alert, oriented. Paraplegic Extremities: No edema or tenderness, Peripheral pulses +ve.   Assessment/Plan:  Right Hemipelvis Osteomyelitis:He is afebrile since 7/16.Marland Kitchen He needs surgical invention. He is being accepted at [facility] with consult to ortho. And plastic surgery. Waiting for the bed availability. His DG and CT images has been Fedex to [facility]. He will come back to our service once they complete their evaluation and treatment. Continue with cefriaxone and Vanc.   HTN: His BP was high on occasions, we increased his lisinopril to 40 . His most recent BP is 130/80 Continue with HCTZ 25 QH and monitor BP.  Eczema:We gave him some hydrocortisone cream 1% for itching.   Dispo: Anticipated discharge in approximately 1-2 day(s). Waiting for bed availability at [facility].    LOS: 16 days   Arnetha Courser, MD 04/22/2016, 1:01 PM Pager: [phone]

## 2016-04-23 ENCOUNTER — Encounter (HOSPITAL_COMMUNITY): Payer: Self-pay | Admitting: *Deleted

## 2016-04-23 DIAGNOSIS — M545 Low back pain: Secondary | ICD-10-CM

## 2016-04-23 NOTE — Progress Notes (Signed)
Pt changing colostomy bag

## 2016-04-23 NOTE — Care Management Note (Signed)
Case Management Note  Patient Details  Name: Cameron Zavala MRN: [id number] Date of Birth: Jul 11, 1976  Subjective/Objective:                    Action/Plan: Awaiting bed at [facility] for transfer. CM continuing to follow for further d/c needs.   Expected Discharge Date:                  Expected Discharge Plan:  Home w Home Health Services  In-House Referral:  Clinical Social Work  Discharge planning Services  CM Consult  Post Acute Care Choice:  Home Health, Durable Medical Equipment, Resumption of Svcs/PTA Provider Choice offered to:  Patient, Spouse  DME Arranged:  IV pump/equipment DME Agency:  [facility]  HH Arranged:  RN [facility] Agency:  [facility]  Status of Service:  Completed, signed off  If discussed at Long Length of Stay Meetings, dates discussed:    Additional Comments:  Kermit Balo, RN 04/23/2016, 3:42 PM

## 2016-04-23 NOTE — Consult Note (Signed)
WOC Nurse wound follow up Wound type: Stage 4 Pressure Injury right ischium Patient seen only for NPWT VAC dressing change to the right ischium today by WOC nurse No change in wound appearance since previous assessment. Drainage (amount, consistency, odor) minimal serous from the ischial wound in the VAC canister Periwound: intact  Dressing procedure/placement/frequency: 1pc of black foam used to fill the right ischial wound, skin protected to the right hip, 1pc of black foam used to bridge the dressing/TRAC pad to the right hip. 1/2 pc of ostomy barrier ring used to flatten crease distal to the wound bed, covered with VAC drape and sealed at . Pt tolerated well without discomfort.  Pt's home Vac machine at bedside ready for use upon discharge.  WOC nurse team will follow along with you for continued NPWT VAC dressing changes Q M/W/F during admission due to complexity of dressing with bridging. Weekly wound assessments of the left trochanter and sacrum.  Thank-you,  Cammie Mcgee MSN, RN, CWOCN, [place], CNS [phone]

## 2016-04-23 NOTE — Progress Notes (Signed)
   Subjective: C/O mild back pain at upper lumbar area.No other concern.Waiting for bed availability at [facility]  Objective:  Vital signs in last 24 hours: Vitals:   04/23/16 0228 04/23/16 0503 04/23/16 0857 04/23/16 1430  BP: (!) 150/83 132/71 (!) 141/77 139/83  Pulse: 94 93 81 92  Resp: 18 18 15 16   Temp: 99.3 F (37.4 C) 98.2 F (36.8 C) 98.2 F (36.8 C) 98.6 F (37 C)  TempSrc: Oral Oral Oral Oral  SpO2: 99% 97% 99% 99%  Weight:      Height:       Physical Exam General:Paraplegic man lying comfortably. No signs of distress. Have some eczema around his left ear. Chest: Clear, No added sound CVS: RRR, No R/G/M. Abd: Soft. There is no tenderness. There is no rebound and no guarding.  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 CNS: Alert, oriented. Paraplegic Extremities: No edema or tenderness, Peripheral pulses +ve. Musculoskeletal: No erythema, no tenderness, intact skin at his lumber spine and back.  Assessment/Plan:  Right Hemipelvis Osteomyelitis:He is afebrile since 7/16.Marland Kitchen He needs surgical invention. He is being accepted at [facility] with consult to ortho. And plastic surgery. Waiting for the bed availability. His DG and CT images has been Fedex to [facility]. He will come back to our service once they complete their evaluation and treatment. Continue with cefriaxone and Vanc.   HTN: His BP was high on occasions, we increased his lisinopril to 40 . His most recent BP is 139/83 Continue with HCTZ 25 QH and monitor BP.  Eczema:We gavehim some hydrocortisone cream 1% for itching.   Backache; Mostly muscular, his being bed bound most of the day. -tylenol PRN -Encourage to get out of bed and sit in chair.  Dispo: Anticipated discharge in approximately 1-2day(s). Waiting for bed availability at [facility].   LOS: 17 days   Arnetha Courser, MD 04/23/2016, 4:15 PM Pager: [phone]

## 2016-04-23 NOTE — Progress Notes (Signed)
Wound vac nurse stopped by, pt has home wound vac in room, to transport with to [place]. Do not have pt go with hospital wound vac machine.   Personal wound vac in room has pt sticker on it.

## 2016-04-24 LAB — VANCOMYCIN, TROUGH: VANCOMYCIN TR: 20 ug/mL (ref 15–20)

## 2016-04-24 LAB — CBC
HCT: 34.5 % — ABNORMAL LOW (ref 39.0–52.0)
Hemoglobin: 10.6 g/dL — ABNORMAL LOW (ref 13.0–17.0)
MCH: 24 pg — AB (ref 26.0–34.0)
MCHC: 30.7 g/dL (ref 30.0–36.0)
MCV: 78.1 fL (ref 78.0–100.0)
PLATELETS: 407 10*3/uL — AB (ref 150–400)
RBC: 4.42 MIL/uL (ref 4.22–5.81)
RDW: 16.6 % — ABNORMAL HIGH (ref 11.5–15.5)
WBC: 6.6 10*3/uL (ref 4.0–10.5)

## 2016-04-24 LAB — BASIC METABOLIC PANEL
Anion gap: 8 (ref 5–15)
BUN: 27 mg/dL — ABNORMAL HIGH (ref 6–20)
CALCIUM: 9.3 mg/dL (ref 8.9–10.3)
CO2: 28 mmol/L (ref 22–32)
CREATININE: 0.85 mg/dL (ref 0.61–1.24)
Chloride: 96 mmol/L — ABNORMAL LOW (ref 101–111)
Glucose, Bld: 99 mg/dL (ref 65–99)
Potassium: 4.7 mmol/L (ref 3.5–5.1)
SODIUM: 132 mmol/L — AB (ref 135–145)
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
## 2016-04-24 NOTE — Progress Notes (Signed)
Patient is incontinent of urine with multiple stage ulcerations around groin and base of spine obtained order based on this and inserted 16 fr foley with Lamont Snowball charge nurse assisting, will email Shaun pt. MRN.

## 2016-04-24 NOTE — Progress Notes (Signed)
Pharmacy Antibiotic Note  Cameron Zavala is a 40 y.o. male admitted on 04/05/2016 with osteomyelitis.  Pharmacy has been consulted for Vancomycin dosing. Goal trough 15-20 mcg/mL. Vancomycin trough is 20 which is at the upper end of the goal range however it was drawn approximately 1 hr early so level is likely therapeutic. Renal function is stable, pt is afebrile and WBC is WNL.   Plan: - Continue vanc 750mg  IV Q12H - F/u renal fxn, C&S, clinical status and trough PRN  Height: 5\' 4"  (162.6 cm) Weight: 134 lb (60.8 kg) IBW/kg (Calculated) : 59.2  Temp (24hrs), Avg:98.5 F (36.9 C), Min:98.1 F (36.7 C), Max:98.8 F (37.1 C)   Recent Labs Lab 04/20/16 0400 04/22/16 0445 04/22/16 1140 04/24/16 0500 04/24/16 1314  WBC  --   --   --  6.6  --   CREATININE 0.62 0.90  --  0.85  --   VANCOTROUGH  --   --  23*  --  20    Estimated Creatinine Clearance: 96.7 mL/min (by C-G formula based on SCr of 0.85 mg/dL).    Allergies  Allergen Reactions  . Baclofen Itching  . Flexeril [Cyclobenzaprine] Other (See Comments)    Extreme tiredness for 24 hours  . Potassium Nausea And Vomiting  . Silver Itching  . Zinc Swelling  . Carbamazepine Hives and Rash  . Chocolate Itching and Rash  . Flour Itching and Rash    Toast, biscuits rolls   . Meat Extract Itching and Rash    Different types of steak  . Other Itching and Rash    Vanilla pudding,  cookies (wafers with white cream in the middle), microwave meals  . Peanut-Containing Drug Products Itching and Rash    Red lips  . Pumpkin Seed Itching and Rash  . Rosuvastatin Calcium Itching and Rash  . Statins Itching and Rash  . Sulfonamide Derivatives Itching and Rash  . Sunflower Seed [Sunflower Oil] Itching and Rash  . Tape Itching and Rash    Plastic tape.  Can only use paper tape.    Antimicrobials and dose adjustments this admission: Vanc 7/13>>    7/15 VT= 10; change to 1250 mg IV q12h    7/19 VT= 21 (drawn slightly early,  likely ~20), continue same dose     7/21 VT = 22 (drawn early), continue same dose    7/27 VT = 23; change vancomycin to 1000mg  IV q12h    7/30 VT = 23 Zosyn 7/13>>7/19 Rocephin 7/19>   Microbiology results: 7/13 Blood x 2 - negative 7/13 Urine - negative    Agapito Games, PharmD, BCPS Clinical Pharmacist 04/24/2016 1:58 PM
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
## 2016-04-24 NOTE — Progress Notes (Signed)
[facility] called, confirming pt still wants to be transferred. RN reported yes. [facility] reports they are still waiting on an available bed and do not know a timeline.

## 2016-04-25 LAB — BASIC METABOLIC PANEL
ANION GAP: 7 (ref 5–15)
BUN: 28 mg/dL — ABNORMAL HIGH (ref 6–20)
CHLORIDE: 97 mmol/L — AB (ref 101–111)
CO2: 28 mmol/L (ref 22–32)
Calcium: 9.1 mg/dL (ref 8.9–10.3)
Creatinine, Ser: 0.74 mg/dL (ref 0.61–1.24)
GFR calc non Af Amer: >60 mL/min (ref >60)
GLUCOSE: 97 mg/dL (ref 65–99)
POTASSIUM: 4.5 mmol/L (ref 3.5–5.1)
Sodium: 132 mmol/L — ABNORMAL LOW (ref 135–145)

## 2016-04-25 NOTE — Progress Notes (Signed)
Pt was having trouble with colostomy bag seal around stoma, I cleaned and applied skin prep and powder to stoma area this obtained good seal, will continue to monitor.

## 2016-04-25 NOTE — Progress Notes (Signed)
  Date: 04/25/2016  Patient name: Cameron Zavala  Medical record number: [id number]  Date of birth: Feb 12, 1976   This patient has been seen and the plan of care was discussed with the house staff. Please see their note for complete details. I concur with their findings with the following additions/corrections: Mr Luczak remains unchanged. Wound care re-eval his wounds. Awaiting [facility] bed for transfer for definitive surgical care.  Burns Spain, MD 04/25/2016, 2:37 PM
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
## 2016-04-25 NOTE — Consult Note (Addendum)
WOC Nurse wound follow up Wound type: Stage 4 Pressure Injury right ischium Wound measurements unchanged since last week; 1.5X2X1.5cm, with tunneling at 12:00 o'clock to 4 cm.  Wound bed 95% red, 5% yellow, small amt yellow drainage in the cannister, no odor. Periwound: intact  Dressing procedure/placement/frequency: 1pc of black foam used to fill the right ischial wound, skin protected to the right hip, 1pc of black foam used to bridge the dressing/TRAC pad to the right hip. 1/2 pc of ostomy barrier ring used to flatten crease distal to the wound bed, covered with VAC drape and sealed at . Pt tolerated well without discomfort.  Pt's home Vac machine at bedside ready for use upon discharge.  Ostomy pouch intact with good seal; patient states he applied it yesterday and extra supplies are at the bedside for staff nurse/patient use. Left hip with stage 2 pressure injury; 2X1X.1cm, has decreased in since since last week's assessment; pink and dry, no odor or drainage. Continue foam dressing to protect and promote healing.  Sacrum wound has improved since last week's assessment; 5X3.5cm, 50% red and moist, 50% yellow slough, small amt yellow drainage, no odor. Continue Santyl for enzymatic debridement of nonviable tissue.  WOC nurse team will follow along with you for continued NPWT VAC dressing changes Q M/W/F during admission due to complexity of dressing with bridging and perform weekly wound assessments of the left trochanter and sacrum wounds.  Thank-you,    Cammie Mcgee MSN, RN, CWOCN, [place], CNS [phone]

## 2016-04-26 MED ORDER — VANCOMYCIN HCL IN DEXTROSE 750-5 MG/150ML-% IV SOLN: 750.0000 mg | Freq: Two times a day (BID) | INTRAVENOUS | Status: DC

## 2016-04-26 MED ORDER — DEXTROSE 5 % IV SOLN: 2.0000 g | INTRAVENOUS | Status: DC

## 2016-04-26 NOTE — Progress Notes (Signed)
RN received phone call from [place], with [facility] transfer services, and confirm patients need of bed/tranfer.

## 2016-04-26 NOTE — Discharge Instructions (Signed)
You will be going to [facility], they will look at your hip and perform surgery there

## 2016-04-26 NOTE — Care Management Note (Signed)
Case Management Note  Patient Details  Name: Cameron Zavala MRN: [id number] Date of Birth: 06-Feb-1976  Subjective/Objective:                    Action/Plan: Waiting on bed at [facility] hospital to transfer patient. CM continuing to follow for d/c needs.   Expected Discharge Date:                  Expected Discharge Plan:  Home w Home Health Services  In-House Referral:  Clinical Social Work  Discharge planning Services  CM Consult  Post Acute Care Choice:  Home Health, Durable Medical Equipment, Resumption of Svcs/PTA Provider Choice offered to:  Patient, Spouse  DME Arranged:  IV pump/equipment DME Agency:  [facility]  HH Arranged:  RN [facility] Agency:  [facility]  Status of Service:  Completed, signed off  If discussed at Long Length of Stay Meetings, dates discussed:    Additional Comments:  Kermit Balo, RN 04/26/2016, 2:21 PM
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
## 2016-04-26 NOTE — Progress Notes (Signed)
Patient's discharge instructions received. Report given to Victorino Dike, Charity fundraiser at 3W at [facility]. [facility] notified that patient will be transferred tonight, waiting for discharge summary and CT disk for transport. Receiving MD specifically requested CD on transfer. Current attending MD prefers patient transfer with wound vac attached and continuous suction on. Patient does not have his wound vac at bedside, states it was taken back by home health services since patient is at hospital. [facility] states that they can return wound vac if it needs to be transported with patient. Waiting for discharge summary, CT disk and then [facility] will be called. Report given to night RN. Patient aware of transfer, stated to Kennith [place], RN that he will notify his wife of transport.

## 2016-04-26 NOTE — Progress Notes (Signed)
Transferred to [facility] via [facility].  VSS, Wound Vac clamped. IV Saline locked.  Lovenox / Ensure given prior to transport.  Denies pain.  [facility] called that [facility] is on way.

## 2016-05-03 ENCOUNTER — Inpatient Hospital Stay (HOSPITAL_COMMUNITY)
Admission: AD | Admit: 2016-05-03 | Discharge: 2016-05-12 | DRG: 540 | Disposition: A | Discharge status: Skilled Nursing Facility | Payer: Medicaid Other | Source: Other Acute Inpatient Hospital | Attending: Oncology | Admitting: Oncology

## 2016-05-03 DIAGNOSIS — F17200 Nicotine dependence, unspecified, uncomplicated: Secondary | ICD-10-CM | POA: Diagnosis present

## 2016-05-03 DIAGNOSIS — D649 Anemia, unspecified: Secondary | ICD-10-CM | POA: Diagnosis present

## 2016-05-03 DIAGNOSIS — M545 Low back pain: Secondary | ICD-10-CM | POA: Diagnosis present

## 2016-05-03 DIAGNOSIS — L89150 Pressure ulcer of sacral region, unstageable: Secondary | ICD-10-CM | POA: Diagnosis present

## 2016-05-03 DIAGNOSIS — E785 Hyperlipidemia, unspecified: Secondary | ICD-10-CM | POA: Diagnosis present

## 2016-05-03 DIAGNOSIS — I10 Essential (primary) hypertension: Secondary | ICD-10-CM | POA: Diagnosis present

## 2016-05-03 DIAGNOSIS — G8220 Paraplegia, unspecified: Secondary | ICD-10-CM | POA: Diagnosis present

## 2016-05-03 DIAGNOSIS — L89222 Pressure ulcer of left hip, stage 2: Secondary | ICD-10-CM | POA: Diagnosis present

## 2016-05-03 DIAGNOSIS — Z8739 Personal history of other diseases of the musculoskeletal system and connective tissue: Secondary | ICD-10-CM

## 2016-05-03 DIAGNOSIS — M869 Osteomyelitis, unspecified: Secondary | ICD-10-CM | POA: Diagnosis present

## 2016-05-03 DIAGNOSIS — L239 Allergic contact dermatitis, unspecified cause: Secondary | ICD-10-CM | POA: Diagnosis present

## 2016-05-03 DIAGNOSIS — K5900 Constipation, unspecified: Secondary | ICD-10-CM | POA: Diagnosis present

## 2016-05-03 DIAGNOSIS — W3400XS Accidental discharge from unspecified firearms or gun, sequela: Secondary | ICD-10-CM | POA: Diagnosis not present

## 2016-05-03 DIAGNOSIS — N39498 Other specified urinary incontinence: Secondary | ICD-10-CM | POA: Diagnosis present

## 2016-05-03 DIAGNOSIS — F329 Major depressive disorder, single episode, unspecified: Secondary | ICD-10-CM | POA: Diagnosis not present

## 2016-05-04 DIAGNOSIS — M8668 Other chronic osteomyelitis, other site: Secondary | ICD-10-CM

## 2016-05-04 DIAGNOSIS — Z933 Colostomy status: Secondary | ICD-10-CM

## 2016-05-04 LAB — CBC WITH DIFFERENTIAL/PLATELET
Basophils Absolute: 0 10*3/uL (ref 0.0–0.1)
Basophils Relative: 0 %
EOS ABS: 0.3 10*3/uL (ref 0.0–0.7)
Eosinophils Relative: 3 %
HCT: 31.7 % — ABNORMAL LOW (ref 39.0–52.0)
Hemoglobin: 9.6 g/dL — ABNORMAL LOW (ref 13.0–17.0)
LYMPHS PCT: 16 %
Lymphs Abs: 1.2 10*3/uL (ref 0.7–4.0)
MCH: 23.6 pg — ABNORMAL LOW (ref 26.0–34.0)
MCHC: 30.3 g/dL (ref 30.0–36.0)
MCV: 77.9 fL — ABNORMAL LOW (ref 78.0–100.0)
Monocytes Absolute: 0.7 10*3/uL (ref 0.1–1.0)
Monocytes Relative: 9 %
NEUTROS PCT: 72 %
Neutro Abs: 5.5 10*3/uL (ref 1.7–7.7)
PLATELETS: 279 10*3/uL (ref 150–400)
RBC: 4.07 MIL/uL — AB (ref 4.22–5.81)
RDW: 16.8 % — ABNORMAL HIGH (ref 11.5–15.5)
WBC: 7.7 10*3/uL (ref 4.0–10.5)

## 2016-05-04 LAB — COMPREHENSIVE METABOLIC PANEL
ALT: 52 U/L (ref 17–63)
ANION GAP: 12 (ref 5–15)
AST: 33 U/L (ref 15–41)
Albumin: 2.8 g/dL — ABNORMAL LOW (ref 3.5–5.0)
Alkaline Phosphatase: 107 U/L (ref 38–126)
BUN: 23 mg/dL — ABNORMAL HIGH (ref 6–20)
CHLORIDE: 99 mmol/L — AB (ref 101–111)
CO2: 25 mmol/L (ref 22–32)
Calcium: 8.8 mg/dL — ABNORMAL LOW (ref 8.9–10.3)
Creatinine, Ser: 0.88 mg/dL (ref 0.61–1.24)
GFR calc Af Amer: >60 mL/min (ref >60)
Glucose, Bld: 104 mg/dL — ABNORMAL HIGH (ref 65–99)
POTASSIUM: 4.2 mmol/L (ref 3.5–5.1)
SODIUM: 136 mmol/L (ref 135–145)
Total Bilirubin: 0.4 mg/dL (ref 0.3–1.2)
Total Protein: 7.1 g/dL (ref 6.5–8.1)

## 2016-05-04 MED ORDER — DEXTROSE 5 % IV SOLN
2.0000 g | Freq: Once | INTRAVENOUS | Status: AC
  Administered 2016-05-04: 2 g via INTRAVENOUS
  Filled 2016-05-04: qty 2

## 2016-05-04 MED ORDER — ENSURE ENLIVE PO LIQD
237.0000 mL | Freq: Three times a day (TID) | ORAL | Status: DC
  Administered 2016-05-04 – 2016-05-12 (×23): 237 mL via ORAL

## 2016-05-04 MED ORDER — VITAMIN D 1000 UNITS PO TABS
1000.0000 [IU] | ORAL_TABLET | Freq: Every day | ORAL | Status: DC
  Administered 2016-05-04 – 2016-05-12 (×9): 1000 [IU] via ORAL
  Filled 2016-05-04 (×9): qty 1

## 2016-05-04 MED ORDER — ENOXAPARIN SODIUM 40 MG/0.4ML ~~LOC~~ SOLN
40.0000 mg | SUBCUTANEOUS | Status: DC
  Administered 2016-05-04 – 2016-05-12 (×9): 40 mg via SUBCUTANEOUS
  Filled 2016-05-04 (×9): qty 0.4

## 2016-05-04 MED ORDER — CEFTRIAXONE SODIUM 2 G IJ SOLR
2.0000 g | INTRAMUSCULAR | Status: DC
Start: 2016-05-05 — End: 2016-05-12
  Administered 2016-05-05 – 2016-05-12 (×8): 2 g via INTRAVENOUS
  Filled 2016-05-04 (×9): qty 2

## 2016-05-04 MED ORDER — DOCUSATE SODIUM 100 MG PO CAPS
100.0000 mg | ORAL_CAPSULE | Freq: Every day | ORAL | Status: DC
  Administered 2016-05-04 – 2016-05-12 (×9): 100 mg via ORAL
  Filled 2016-05-04 (×10): qty 1

## 2016-05-04 MED ORDER — COLLAGENASE 250 UNIT/GM EX OINT
TOPICAL_OINTMENT | Freq: Every day | CUTANEOUS | Status: DC
  Administered 2016-05-05 – 2016-05-10 (×6): via TOPICAL
  Administered 2016-05-11: 1 via TOPICAL
  Administered 2016-05-12: 10:00:00 via TOPICAL
  Filled 2016-05-04: qty 30

## 2016-05-04 MED ORDER — SODIUM CHLORIDE 0.9% FLUSH
10.0000 mL | INTRAVENOUS | Status: DC | PRN
  Administered 2016-05-05: 20 mL
  Administered 2016-05-10: 10 mL
  Filled 2016-05-04 (×2): qty 40

## 2016-05-04 MED ORDER — VITAMIN C 500 MG PO TABS
1000.0000 mg | ORAL_TABLET | Freq: Every day | ORAL | Status: DC
Start: 2016-05-04 — End: 2016-05-12
  Administered 2016-05-04 – 2016-05-12 (×9): 1000 mg via ORAL
  Filled 2016-05-04 (×10): qty 2

## 2016-05-04 MED ORDER — CITALOPRAM HYDROBROMIDE 20 MG PO TABS
20.0000 mg | ORAL_TABLET | Freq: Every day | ORAL | Status: DC
  Administered 2016-05-04 – 2016-05-12 (×9): 20 mg via ORAL
  Filled 2016-05-04 (×9): qty 1

## 2016-05-04 MED ORDER — ACETAMINOPHEN 500 MG PO TABS
500.0000 mg | ORAL_TABLET | Freq: Every evening | ORAL | Status: DC | PRN
  Administered 2016-05-04: 500 mg via ORAL
  Filled 2016-05-04: qty 1

## 2016-05-04 MED ORDER — LISINOPRIL-HYDROCHLOROTHIAZIDE 20-25 MG PO TABS: 1.0000 | ORAL_TABLET | ORAL | Status: DC

## 2016-05-04 MED ORDER — TRAMADOL HCL 50 MG PO TABS
50.0000 mg | ORAL_TABLET | Freq: Every day | ORAL | Status: DC | PRN
  Administered 2016-05-04 – 2016-05-11 (×5): 50 mg via ORAL
  Filled 2016-05-04 (×5): qty 1

## 2016-05-04 MED ORDER — LISINOPRIL 20 MG PO TABS
20.0000 mg | ORAL_TABLET | Freq: Every day | ORAL | Status: DC
  Administered 2016-05-04 – 2016-05-12 (×9): 20 mg via ORAL
  Filled 2016-05-04 (×9): qty 1

## 2016-05-04 MED ORDER — ACETAMINOPHEN 325 MG PO TABS: 650.0000 mg | ORAL_TABLET | Freq: Four times a day (QID) | ORAL | Status: DC | PRN

## 2016-05-04 MED ORDER — HYDROCHLOROTHIAZIDE 25 MG PO TABS
25.0000 mg | ORAL_TABLET | Freq: Every day | ORAL | Status: DC
  Administered 2016-05-04 – 2016-05-12 (×9): 25 mg via ORAL
  Filled 2016-05-04 (×9): qty 1

## 2016-05-04 MED ORDER — VANCOMYCIN HCL IN DEXTROSE 1-5 GM/200ML-% IV SOLN
1000.0000 mg | Freq: Two times a day (BID) | INTRAVENOUS | Status: DC
  Administered 2016-05-04 – 2016-05-05 (×4): 1000 mg via INTRAVENOUS
  Filled 2016-05-04 (×5): qty 200

## 2016-05-04 MED ORDER — NAPROXEN 250 MG PO TABS
500.0000 mg | ORAL_TABLET | Freq: Two times a day (BID) | ORAL | Status: DC | PRN
  Administered 2016-05-06: 500 mg via ORAL
  Filled 2016-05-04: qty 2

## 2016-05-04 MED ORDER — VANCOMYCIN HCL IN DEXTROSE 750-5 MG/150ML-% IV SOLN
750.0000 mg | Freq: Two times a day (BID) | INTRAVENOUS | Status: DC
  Filled 2016-05-04: qty 150

## 2016-05-04 NOTE — Care Management Note (Signed)
Case Management Note  Patient Details  Name: Teal Ouellette MRN: [id number] Date of Birth: [birth year]  Subjective/Objective:  Osteomyelitis and ischial sacral, and hip ulcers , needs IV abx until 9/17                 Action/Plan: Discharge Planning:  NCM spoke to pt and wife, Stanton Kidney at bedside. Pt has the [facility] orange card. He currently lives at home with wife. Wife states she will not be able to administer the IV abx at home. She is legally blind. Requesting pt dc to SNF. CSW referral sent for SNF. Will need wound vac at facility. NCM contacted [employer] rep, Ricki  # and she will arrange wound vac once SNF facility has been arranged. Please contact her for a portable wound vac to transport pt to facility.    Expected Discharge Date:                  Expected Discharge Plan:  Skilled Nursing Facility  In-House Referral:  Clinical Social Work  Discharge planning Services  CM Consult  Post Acute Care Choice:  NA Choice offered to:  NA  DME Arranged:  Vac DME Agency:  [employer] ([employer] will provided wound vac to scheduled SNF, they will deliver a portable vac to room at dc)  HH Arranged:  NA HH Agency:  NA  Status of Service:  In process, will continue to follow  If discussed at Long Length of Stay Meetings, dates discussed:    Additional Comments:  Elliot Cousin, RN 05/04/2016, 2:58 PM

## 2016-05-04 NOTE — Progress Notes (Signed)
Pharmacy Antibiotic Note  Cameron Zavala is a 40 y.o. male admitted on 05/03/2016 with  ongoing bone infection in pelvis and upper leg bone.  Pharmacy has been consulted for Vancomycin dosing. Pt also on Rocephin. Pt known to pharmacy from previous admission. Pt was transferred to [facility] for possible advanced surgical intervention for ongoing bone infection in pelvis and upper leg bone. Surgery too high risk at this time so plan per ID MDs to complete 8 week course of abx (through 9/9).Last vanc dose 1gm given at [facility] at 8/10 ~1400 and last Rocephin 2gm dose given 8/10 ~0200.  8/8 Vancomycin trough 16.9 mcg/ml (therapeutic) on 1gm IV q12h at [facility]. Scr 0.92 on this day  8/9 Labs from [facility]: WBC 6.1, SCr 1.05 (noted pt with paraplegia so SCr not reflective of renal function)  Plan: Continue Vancomycin 1gm IV q12h. Next dose now. F/u SCr trend, UOP, pt's clinical condition Will check Vanc trough with 4th dose here    Temp (24hrs), Avg:99.5 F (37.5 C), Min:99.5 F (37.5 C), Max:99.5 F (37.5 C)  No results for input(s): WBC, CREATININE, LATICACIDVEN, VANCOTROUGH, VANCOPEAK, VANCORANDOM, GENTTROUGH, GENTPEAK, GENTRANDOM, TOBRATROUGH, TOBRAPEAK, TOBRARND, AMIKACINPEAK, AMIKACINTROU, AMIKACIN in the last 168 hours.  CrCl cannot be calculated (Unknown ideal weight.).    Allergies  Allergen Reactions  . Baclofen Itching  . Flexeril [Cyclobenzaprine] Other (See Comments)    Extreme tiredness for 24 hours  . Potassium Nausea And Vomiting  . Silver Itching  . Zinc Swelling  . Carbamazepine Hives and Rash  . Chocolate Itching and Rash  . Flour Itching and Rash    Toast, biscuits rolls   . Meat Extract Itching and Rash    Different types of steak  . Other Itching and Rash    Vanilla pudding,  cookies (wafers with white cream in the middle), microwave meals  . Peanut-Containing Drug Products Itching and Rash    Red lips  . Pumpkin Seed Itching and Rash  . Rosuvastatin Calcium Itching and Rash   . Statins Itching and Rash  . Sulfonamide Derivatives Itching and Rash  . Sunflower Seed [Sunflower Oil] Itching and Rash  . Tape Itching and Rash    Plastic tape.  Can only use paper tape.    Antimicrobials this admission: Vanc 7/13>> Zosyn 7/13>>7/19 Rocephin 7/19>   Thank you for allowing pharmacy to be a part of this patient's care.  Christoper Fabian, PharmD, BCPS Clinical pharmacist, pager [phone] 05/04/2016 1:34 AM

## 2016-05-04 NOTE — Consult Note (Addendum)
WOC Nurse wound follow up Wound type: Stage 4 Pressure Injury right ischium, beefy red with bone palpable. Wound measurements: 1.5X2X1.5cm, with tunneling at 12:00 o'clock to 4 cm.  Wound bed 100% red, small amt yellow drainage in the cannister, no odor. Periwound: intact  Dressing procedure/placement/frequency: 1pc of black foam used to fill the right ischial wound, skin protected to the right hip, 1pc of black foam used to bridge the dressing/TRAC pad to the right hip. 1/2 pc of ostomy barrier ring used to flatten crease distal to the wound bed, covered with VAC drape and sealed at . Pt tolerated well without discomfort.  Ostomy pouch intact with good seal; patient states he applied it 2 days ago and extra supplies are at the bedside for staff nurse/patient use.  Left hip with stage 2 pressure injury; 2X1X.1cm, pink and dry, no odor or drainage. Continue foam dressing to protect and promote healing.  Sacrum unstageable wound has improved since last week's assessment; 5X4cm, 60% red and moist, 40% yellow slough, small amt yellow drainage, no odor. Continue Santyl for enzymatic debridement of nonviable tissue.  WOC nurse team will follow along with you for continued NPWT VAC dressing changes Q M/W/F during admission due to complexity of dressing with bridging and perform weekly wound assessments of the left trochanter and sacrum wounds. Air mattress ordered to decrease pressure. Thank-you,  Cammie Mcgee MSN, RN, CWOCN, [place], CNS [phone]

## 2016-05-04 NOTE — NC FL2 (Signed)
[employer] MEDICAID FL2 LEVEL OF CARE SCREENING TOOL     IDENTIFICATION  Patient Name: Cameron Zavala Birthdate: [birth year] Sex: male Admission Date (Current Location): 05/03/2016  [facility] and [place] Number:  Producer, television/film/video and Address:  [facility] [place], [address], [place], [place] [address]      Provider Number: [id number]  Attending Physician Name and Address:  Burns Spain, MD  Relative Name and Phone Number:       Current Level of Care: Hospital Recommended Level of Care: Skilled Nursing Facility Prior Approval Number:    Date Approved/Denied:   PASRR Number:    Discharge Plan: SNF    Current Diagnoses: Patient Active Problem List   Diagnosis Date Noted  . Osteomyelitis (HCC) 05/04/2016  . Pressure ulcer 04/16/2016  . Pyrexia   . FUO (fever of unknown origin)   . Osteomyelitis, pelvic region and thigh (HCC)   . Anemia 01/12/2016  . 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 History of necrotizing fasciitis 01/11/2016  . Prediabetes 12/07/2015  . Hyperlipidemia 12/07/2015  . Vitamin D deficiency 12/07/2015  . Neuropathic pain of right flank 11/04/2015  . Chronic infective otitis externa of both ears 08/29/2014  . Preventative health care 07/29/2013  . Food allergy 01/06/2013  . Blurry vision, bilateral 11/05/2011  . Decubitus ulcers   . Back pain 07/12/2011  . Neurogenic urinary incontinence 04/09/2011  . Right flank pain, chronic 03/06/2011  . INSOMNIA 08/01/2009  . Osteopenia 06/30/2008  . Constipation 05/25/2008  . Metabolic syndrome 04/01/2007  . Unspecified mood (affective) disorder (HCC) 04/01/2007  . Essential hypertension 04/01/2007  . ALLERGIC RHINITIS 04/01/2007    Orientation RESPIRATION BLADDER Height & Weight     Self, Time, Situation, Place  Normal Continent Weight: 133 lb 7.9 oz (60.6 kg) Height:  5\' 4"  (162.6 cm)  BEHAVIORAL SYMPTOMS/MOOD NEUROLOGICAL BOWEL NUTRITION STATUS      Continent     AMBULATORY STATUS COMMUNICATION OF NEEDS Skin   Total Care Verbally Normal                       Personal Care Assistance Level of Assistance  Bathing, Dressing Bathing Assistance: Maximum assistance   Dressing Assistance: Maximum assistance     Functional Limitations Info             SPECIAL CARE FACTORS FREQUENCY                       Contractures      Additional Factors Info                  Current Medications (05/04/2016):  This is the current hospital active medication list Current Facility-Administered Medications  Medication Dose Route Frequency Provider Last Rate Last Dose  . acetaminophen (TYLENOL) tablet 500 mg  500 mg Oral QHS PRN Su Hoff, MD      . Melene Muller ON 05/05/2016] cefTRIAXone (ROCEPHIN) 2 g in dextrose 5 % 50 mL IVPB  2 g Intravenous Q24H Carly J Rivet, MD      . cholecalciferol (VITAMIN D) tablet 1,000 Units  1,000 Units Oral Daily Su Hoff, MD   1,000 Units at 05/04/16 1040  . citalopram (CELEXA) tablet 20 mg  20 mg Oral Daily Su Hoff, MD   20 mg at 05/04/16 1040  . collagenase (SANTYL) ointment   Topical Daily Burns Spain, MD      .  docusate sodium (COLACE) capsule 100 mg  100 mg Oral Daily Su Hoff, MD   100 mg at 05/04/16 1040  . enoxaparin (LOVENOX) injection 40 mg  40 mg Subcutaneous Q24H Su Hoff, MD   40 mg at 05/04/16 1114  . feeding supplement (ENSURE ENLIVE) (ENSURE ENLIVE) liquid 237 mL  237 mL Oral TID BM Carly J Rivet, MD   237 mL at 05/04/16 1040  . lisinopril (PRINIVIL,ZESTRIL) tablet 20 mg  20 mg Oral Daily Su Hoff, MD   20 mg at 05/04/16 1040   And  . hydrochlorothiazide (HYDRODIURIL) tablet 25 mg  25 mg Oral Daily Carly J Rivet, MD   25 mg at 05/04/16 1040  . naproxen (NAPROSYN) tablet 500 mg  500 mg Oral BID PRN Su Hoff, MD      . sodium chloride flush (NS) 0.9 % injection 10-40 mL  10-40 mL Intracatheter PRN Burns Spain, MD      . traMADol Janean Sark) tablet 50 mg   50 mg Oral Daily PRN Su Hoff, MD   50 mg at 05/04/16 0500  . vancomycin (VANCOCIN) IVPB 1000 mg/200 mL premix  1,000 mg Intravenous Q12H Titus Mould, RPH   1,000 mg at 05/04/16 1619  . vitamin C (ASCORBIC ACID) tablet 1,000 mg  1,000 mg Oral Daily Su Hoff, MD   1,000 mg at 05/04/16 1040     Discharge Medications: Please see discharge summary for a list of discharge medications.  Relevant Imaging Results:  Relevant Lab Results:   Additional Information    Harlon Flor, [place] R
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
## 2016-05-05 ENCOUNTER — Encounter (HOSPITAL_COMMUNITY): Payer: Self-pay

## 2016-05-05 LAB — BASIC METABOLIC PANEL
Anion gap: 9 (ref 5–15)
BUN: 22 mg/dL — ABNORMAL HIGH (ref 6–20)
CHLORIDE: 99 mmol/L — AB (ref 101–111)
CO2: 25 mmol/L (ref 22–32)
CREATININE: 0.81 mg/dL (ref 0.61–1.24)
Calcium: 8.7 mg/dL — ABNORMAL LOW (ref 8.9–10.3)
Glucose, Bld: 95 mg/dL (ref 65–99)
POTASSIUM: 4.4 mmol/L (ref 3.5–5.1)
SODIUM: 133 mmol/L — AB (ref 135–145)
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
## 2016-05-05 NOTE — Progress Notes (Signed)
Air mattress ordered and delivered to floor. Patient states he prefers to be transferred onto new mattress in am. RN explains reasoning for air mattress to prevent further skin breakdown. Patient states, "I will keep moving in bed".

## 2016-05-06 LAB — VANCOMYCIN, RANDOM: VANCOMYCIN RM: 40

## 2016-05-06 LAB — BASIC METABOLIC PANEL
ANION GAP: 10 (ref 5–15)
BUN: 28 mg/dL — ABNORMAL HIGH (ref 6–20)
CO2: 26 mmol/L (ref 22–32)
Calcium: 8.7 mg/dL — ABNORMAL LOW (ref 8.9–10.3)
Chloride: 99 mmol/L — ABNORMAL LOW (ref 101–111)
Creatinine, Ser: 1.16 mg/dL (ref 0.61–1.24)
Glucose, Bld: 130 mg/dL — ABNORMAL HIGH (ref 65–99)
POTASSIUM: 4.1 mmol/L (ref 3.5–5.1)
SODIUM: 135 mmol/L (ref 135–145)

## 2016-05-06 LAB — VANCOMYCIN, TROUGH: Vancomycin Tr: 52 ug/mL (ref 15–20)

## 2016-05-06 MED ORDER — ONDANSETRON HCL 4 MG PO TABS
4.0000 mg | ORAL_TABLET | Freq: Once | ORAL | Status: AC
  Administered 2016-05-06: 4 mg via ORAL
  Filled 2016-05-06: qty 1

## 2016-05-06 NOTE — Progress Notes (Signed)
Pharmacy Antibiotic Note  Cameron Zavala is a 40 y.o. male admitted on 05/03/2016 with osteomyelitis.  Pharmacy has been consulted for vancomycin dosing.  Vanc trough 52 >>> goal; lab was drawn correctly and RN notes that dose was not hung prior to lab draw; however, this result is unexpected 2/2 this dose was based on troughs at [facility], discussed w/ RPh at [facility] on pt's arrival here.  No change in renal function.  RN had hung next dose of vanc, about half of the dose was infused when result was called, infusion was subsequently stopped.  Plan: Will get random vanc level in 12hr to verify accuracy; calculating PK will likely be difficult 2/2 pt getting half dose.  Will also get updated BMET.  Height: 5\' 4"  (162.6 cm) Weight: 133 lb 7.9 oz (60.6 kg) IBW/kg (Calculated) : 59.2  Temp (24hrs), Avg:98.4 F (36.9 C), Min:98.1 F (36.7 C), Max:98.8 F (37.1 C)   Recent Labs Lab 05/04/16 0543 05/05/16 0835 05/06/16 0400  WBC 7.7  --   --   CREATININE 0.88 0.81  --   VANCOTROUGH  --   --  52*    Estimated Creatinine Clearance: 101.5 mL/min (by C-G formula based on SCr of 0.81 mg/dL).    Allergies  Allergen Reactions  . Baclofen Itching  . Flexeril [Cyclobenzaprine] Other (See Comments)    Extreme tiredness for 24 hours  . Potassium Nausea And Vomiting  . Silver Itching  . Zinc Swelling  . Carbamazepine Hives and Rash  . Chocolate Itching and Rash  . Flour Itching and Rash    Toast, biscuits rolls   . Meat Extract Itching and Rash    Different types of steak  . Other Itching and Rash    Vanilla pudding,  cookies (wafers with white cream in the middle), microwave meals  . Peanut-Containing Drug Products Itching and Rash    Red lips  . Pumpkin Seed Itching and Rash  . Rosuvastatin Calcium Itching and Rash  . Statins Itching and Rash  . Sulfonamide Derivatives Itching and Rash  . Sunflower Seed [Sunflower Oil] Itching and Rash  . Tape Itching and Rash    Plastic tape.  Can  only use paper tape.    Antimicrobials this admission: Vanc 7/13>> Zosyn 7/13>>7/19 Rocephin 7/19>  Dose adjustments this admission: 8/8 vanc trough = 16.9 on 1g IV q12 (SCr 0.92)   Thank you for allowing pharmacy to be a part of this patient's care.  Vernard Gambles, PharmD, BCPS  05/06/2016 5:45 AM
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
## 2016-05-06 NOTE — Progress Notes (Signed)
Pharmacy Antibiotic Note  Cameron Zavala is a 40 y.o. male admitted on 05/03/2016 with osteomyelitis.  Pharmacy has been consulted for vancomycin dosing.  Vanc trough 52 >>> goal; lab was drawn correctly and RN notes that dose was not hung prior to lab draw; however, this result is unexpected 2/2 this dose was based on troughs at [facility], discussed w/ RPh at [facility] on pt's arrival here.  No change in renal function.  RN had hung next dose of vanc, about half of the dose was infused when result was called, infusion was subsequently stopped.  Repeat vancomycin random level remains elevated at 40. Will continue to hold vancomycin and recheck vancomycin random in 24h.  Plan: Will get random vanc level in 24hr Monitor culture data, renal function and clinical course  Height: 5\' 4"  (162.6 cm) Weight: 133 lb 7.9 oz (60.6 kg) IBW/kg (Calculated) : 59.2  Temp (24hrs), Avg:98.5 F (36.9 C), Min:98.4 F (36.9 C), Max:98.8 F (37.1 C)   Recent Labs Lab 05/04/16 0543 05/05/16 0835 05/06/16 0400 05/06/16 1820  WBC 7.7  --   --   --   CREATININE 0.88 0.81  --  1.16  VANCOTROUGH  --   --  52*  --   VANCORANDOM  --   --   --  40    Estimated Creatinine Clearance: 70.9 mL/min (by C-G formula based on SCr of 1.16 mg/dL).    Allergies  Allergen Reactions  . Baclofen Itching  . Flexeril [Cyclobenzaprine] Other (See Comments)    Extreme tiredness for 24 hours  . Potassium Nausea And Vomiting  . Silver Itching  . Zinc Swelling  . Carbamazepine Hives and Rash  . Chocolate Itching and Rash  . Flour Itching and Rash    Toast, biscuits rolls   . Meat Extract Itching and Rash    Different types of steak  . Other Itching and Rash    Vanilla pudding,  cookies (wafers with white cream in the middle), microwave meals  . Peanut-Containing Drug Products Itching and Rash    Red lips  . Pumpkin Seed Itching and Rash  . Rosuvastatin Calcium Itching and Rash  . Statins Itching and Rash  .  Sulfonamide Derivatives Itching and Rash  . Sunflower Seed [Sunflower Oil] Itching and Rash  . Tape Itching and Rash    Plastic tape.  Can only use paper tape.    Antimicrobials this admission: Vanc 7/13>> Zosyn 7/13>>7/19 Rocephin 7/19>  Dose adjustments this admission: 8/8 vanc trough = 16.9 on 1g IV q12 (SCr 0.92)   Arlean Hopping. Newman Pies, PharmD, BCPS Clinical Pharmacist Pager [phone] 05/06/2016 7:07 PM

## 2016-05-07 LAB — BASIC METABOLIC PANEL
Anion gap: 7 (ref 5–15)
BUN: 30 mg/dL — ABNORMAL HIGH (ref 6–20)
CALCIUM: 8.7 mg/dL — AB (ref 8.9–10.3)
CO2: 25 mmol/L (ref 22–32)
CREATININE: 1.12 mg/dL (ref 0.61–1.24)
Chloride: 102 mmol/L (ref 101–111)
GFR calc non Af Amer: >60 mL/min (ref >60)
Glucose, Bld: 103 mg/dL — ABNORMAL HIGH (ref 65–99)
Potassium: 4.2 mmol/L (ref 3.5–5.1)
Sodium: 134 mmol/L — ABNORMAL LOW (ref 135–145)

## 2016-05-07 LAB — VANCOMYCIN, RANDOM: Vancomycin Rm: 14

## 2016-05-07 MED ORDER — VANCOMYCIN HCL IN DEXTROSE 1-5 GM/200ML-% IV SOLN
1000.0000 mg | Freq: Once | INTRAVENOUS | Status: AC
  Administered 2016-05-07: 1000 mg via INTRAVENOUS
  Filled 2016-05-07: qty 200

## 2016-05-07 NOTE — Consult Note (Signed)
WOC Nurse wound follow up Wound type: Stage 4 Pressure Injury right ischium, unchanged in appearance since previous consult. Dressing procedure/placement/frequency: 1pc of black foam used to fill the right ischial wound, skin protected to the right hip, 1pc of black foam used to bridge the dressing/TRAC pad to the right hip. 1/2 pc of ostomy barrier ring used to flatten crease distal to the wound bed, covered with VAC drape and sealed at . Pt tolerated well without discomfort.  Sacrum unstageable wound has improved since previous assessment; 30% yellow slough, 70% red, small amt yellow drainage, no odor. Continue Santyl for enzymatic debridement of nonviable tissue.  WOC nurse team will follow along with you for continued NPWT VAC dressing changes Q M/W/F during admission due to complexity of dressing with bridging.  Air mattress in place to decrease pressure. Thank-you,  Cammie Mcgee MSN, RN, CWOCN, [place], CNS [phone]
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
## 2016-05-07 NOTE — Clinical Social Work Note (Signed)
Clinical Social Work Assessment  Patient Details  Name: Cameron Zavala MRN: [id number] Date of Birth: April 08, 1976  Date of referral:  05/07/16               Reason for consult:  Facility Placement                Permission sought to share information with:   (Facilities ) Permission granted to share information::   (Facilities)  Name::        Agency::     Relationship::     Contact Information:     Housing/Transportation Living arrangements for the past 2 months:  Single Family Home (Patient lives at home with his wife.) Source of Information:  Patient Patient Interpreter Needed:  None Criminal Activity/Legal Involvement Pertinent to Current Situation/Hospitalization:  No - Comment as needed Significant Relationships:  Spouse Lives with:  Spouse Do you feel safe going back to the place where you live?   (Patient is agreeable to go to SNF.) Need for family participation in patient care:     Care giving concerns:  Patient is from home and lives with wife. Patient requires assistance with ADL's. SW has referred the pt to facilities in attempts to try and obtain placement for patient.   Social Worker assessment / plan:  SW met with patient at bedside. Patient was alert and oriented. Wife was present. SW informed pt and wife that he has been referred to facilities in order to try and obtain SNF and that she will inform him once facilities start to offer a bed. Patient is agreeable.   Employment status:  Disabled (Comment on whether or not currently receiving Disability) Insurance information:   (None.) PT Recommendations:  [place] / Referral to community resources:   (SW spoke with pt and wife regarding facilities. Both are aware that he will be faxed out and are agreeable.)  Patient/Family's Response to care:  Patient and family are appropriate at this time.  Patient/Family's Understanding of and Emotional Response to Diagnosis, Current Treatment, and  Prognosis:  Patient and wife have no questions.   Emotional Assessment Appearance:  Appears stated age Attitude/Demeanor/Rapport:   (Appropriate.) Affect (typically observed):  Accepting, Appropriate Orientation:  Oriented to Self, Oriented to Place, Oriented to  Time, Oriented to Situation Alcohol / Substance use:  Not Applicable Psych involvement (Current and /or in the community):  No (Comment)  Discharge Needs  Concerns to be addressed:  Adjustment to Illness Readmission within the last 30 days:  No Current discharge risk:   (No INS. ) Barriers to Discharge:  Other (Patient will be LOG. No insurance.)   Cameron Zavala R 05/07/2016, 3:05 PM

## 2016-05-07 NOTE — Care Management (Signed)
Casemanager confirmed with WOC nurse that patient will require a KCI wound vac at discharge. CM informed Social worker, plan is for discharge on tomorrow when facility will have wound vac.

## 2016-05-07 NOTE — Progress Notes (Addendum)
Pharmacy Antibiotic Note  Cameron Zavala is a 40 y.o. male admitted on 05/03/2016 with osteomyelitis.  Pharmacy has been consulted for vancomycin dosing.  Vancomycin levels were elevated and a dose has not been given since 8/12 at ~1630 (however, likely only half of this was given as it was hung before SUPRAtherapeutic level resulted on that day).  Vancomycin level this evening has decreased to 75mcg/mL. Scr relatively the same, however if UOP is accurately charted it is poor, and patient is paraplegic which makes determining true renal function difficult using CrCl. In addition, there was a jump in SCr from 0.8-1.1 which is significant in this patient population.  Plan: -Vancomycin 1g IV x1 tonight with plans to recheck a vancomycin level in 24h -Monitor culture data, renal function and clinical course  Height: 5\' 4"  (162.6 cm) Weight: 133 lb 7.9 oz (60.6 kg) IBW/kg (Calculated) : 59.2  Temp (24hrs), Avg:98.3 F (36.8 C), Min:98.3 F (36.8 C), Max:98.3 F (36.8 C)   Recent Labs Lab 05/04/16 0543 05/05/16 0835 05/06/16 0400 05/06/16 1820 05/07/16 0535 05/07/16 2115  WBC 7.7  --   --   --   --   --   CREATININE 0.88 0.81  --  1.16 1.12  --   VANCOTROUGH  --   --  52*  --   --   --   VANCORANDOM  --   --   --  40  --  14    Estimated Creatinine Clearance: 73.4 mL/min (by C-G formula based on SCr of 1.12 mg/dL).    Allergies  Allergen Reactions  . Baclofen Itching  . Flexeril [Cyclobenzaprine] Other (See Comments)    Extreme tiredness for 24 hours  . Potassium Nausea And Vomiting  . Silver Itching  . Zinc Swelling  . Carbamazepine Hives and Rash  . Chocolate Itching and Rash  . Flour Itching and Rash    Toast, biscuits rolls   . Meat Extract Itching and Rash    Different types of steak  . Other Itching and Rash    Vanilla pudding,  cookies (wafers with white cream in the middle), microwave meals  . Peanut-Containing Drug Products Itching and Rash    Red lips  .  Pumpkin Seed Itching and Rash  . Rosuvastatin Calcium Itching and Rash  . Statins Itching and Rash  . Sulfonamide Derivatives Itching and Rash  . Sunflower Seed [Sunflower Oil] Itching and Rash  . Tape Itching and Rash    Plastic tape.  Can only use paper tape.    Antimicrobials this admission: Vanc 7/13>> Zosyn 7/13>>7/19 Rocephin 7/19>  Dose adjustments this admission: 8/8 VT (@ [facility]) = 16.9 on 1g IV q12 (SCr 0.92) 8/13 VT = 52 on 1g IV q12 (SCr 0.81) 8/13 VR = 40 (SCr 1.16) 8/14 VR = 14 (SCr 1.12)   Nikkole Placzek D. Maysen Sudol, PharmD, BCPS Clinical Pharmacist Pager: [phone] 05/07/2016 10:35 PM

## 2016-05-08 DIAGNOSIS — G8220 Paraplegia, unspecified: Secondary | ICD-10-CM

## 2016-05-08 DIAGNOSIS — B9689 Other specified bacterial agents as the cause of diseases classified elsewhere: Secondary | ICD-10-CM

## 2016-05-08 DIAGNOSIS — I10 Essential (primary) hypertension: Secondary | ICD-10-CM

## 2016-05-08 DIAGNOSIS — L89229 Pressure ulcer of left hip, unspecified stage: Secondary | ICD-10-CM

## 2016-05-08 DIAGNOSIS — L89159 Pressure ulcer of sacral region, unspecified stage: Secondary | ICD-10-CM

## 2016-05-08 DIAGNOSIS — L239 Allergic contact dermatitis, unspecified cause: Secondary | ICD-10-CM

## 2016-05-08 DIAGNOSIS — M868X8 Other osteomyelitis, other site: Secondary | ICD-10-CM

## 2016-05-08 LAB — VANCOMYCIN, RANDOM: VANCOMYCIN RM: 12

## 2016-05-08 MED ORDER — TRAMADOL HCL 50 MG PO TABS: 50.0000 mg | ORAL_TABLET | Freq: Every day | ORAL | 0 refills | Status: DC | PRN

## 2016-05-08 MED ORDER — HYDROCORTISONE 0.5 % EX CREA
TOPICAL_CREAM | Freq: Three times a day (TID) | CUTANEOUS | Status: DC
  Administered 2016-05-08 – 2016-05-12 (×7): via TOPICAL
  Filled 2016-05-08 (×2): qty 28.35

## 2016-05-08 NOTE — Clinical Social Work Note (Addendum)
Patient requiring SNF placement due to patient's wound care needs and IV antibiotics. LCSW has been actively pursuing SNF placement for patient, however, LCSW now requiring assistance from CSW Chiropodist for "Difficult to Place" placement.  Barriers to discharge: Patient is without payer source (uninsured) and unable to complete Medicaid application.  LCSW will assist with patient's discharge disposition once SNF bed obtained by CSW Chiropodist.  Marcelline Deist, LCSW [phone] Orthopedic Social Worker

## 2016-05-08 NOTE — Care Management (Signed)
As of this time [facility] will not provide care for patient.
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
## 2016-05-08 NOTE — Care Management (Signed)
Case manager contacted Josph Macho with [facility] to determine if they will be able to provide [facility] for wound vac, when patient discharges from SNF.

## 2016-05-09 DIAGNOSIS — N318 Other neuromuscular dysfunction of bladder: Secondary | ICD-10-CM

## 2016-05-09 DIAGNOSIS — M6259 Muscle wasting and atrophy, not elsewhere classified, multiple sites: Secondary | ICD-10-CM

## 2016-05-09 DIAGNOSIS — Z960 Presence of urogenital implants: Secondary | ICD-10-CM

## 2016-05-09 DIAGNOSIS — R3981 Functional urinary incontinence: Secondary | ICD-10-CM

## 2016-05-09 MED ORDER — VANCOMYCIN HCL 10 G IV SOLR
1250.0000 mg | INTRAVENOUS | Status: DC
  Administered 2016-05-09 – 2016-05-12 (×4): 1250 mg via INTRAVENOUS
  Filled 2016-05-09 (×5): qty 1250
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
## 2016-05-09 NOTE — Progress Notes (Addendum)
Pharmacy Antibiotic Note  Cameron Zavala is a 40 y.o. male admitted on 05/03/2016 with osteomyelitis.  Pharmacy has been consulted for Vancomycin dosing.  Antimicrobials this admission: Vanc 7/13>> Rocephin 7/19> Zosyn 7/13>>7/19  Dose adjustments this admission: 8/8 VT (@ [facility]) = 16.9 on 1g IV q12 (SCr 0.92) 8/13 VT = 52 on 1g IV q12 (SCr 0.81) 8/13 VR = 40 (SCr 1.16) 8/14 VR = 14 (SCr 1.12)--Vancomycin 1000 mg IV x 1 given 8/15 VR = 12   Plan: -Will plan for Vancomycin 1250 mg IV q24h -Re-check random level with any significant clinical changes, check trough at new steady state  Height: 5\' 4"  (162.6 cm) Weight: 133 lb 7.9 oz (60.6 kg) IBW/kg (Calculated) : 59.2  Temp (24hrs), Avg:98.1 F (36.7 C), Min:97.8 F (36.6 C), Max:98.4 F (36.9 C)   Recent Labs Lab 05/04/16 0543 05/05/16 0835 05/06/16 0400  05/06/16 1820 05/07/16 0535 05/07/16 2115 05/08/16 2222  WBC 7.7  --   --   --   --   --   --   --   CREATININE 0.88 0.81  --   --  1.16 1.12  --   --   VANCOTROUGH  --   --  52*  --   --   --   --   --   VANCORANDOM  --   --   --   < > 40  --  14 12  < > = values in this interval not displayed.  Estimated Creatinine Clearance: 73.4 mL/min (by C-G formula based on SCr of 1.12 mg/dL).     [name] 05/09/2016 12:18 AM
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
## 2016-05-09 NOTE — Consult Note (Signed)
WOC Nurse wound follow up Wound type: Stage 4 Pressure Injury right ischium, unchanged in appearance since previous consult. Dressing procedure/placement/frequency: 1pc of black foam used to fill the right ischial wound, skin protected to the right hip, 1pc of black foam used to bridge the dressing/TRAC pad to the right hip. 1/2 pc of ostomy barrier ring used to flatten crease distal to the wound bed, covered with VAC drape and sealed at . Pt tolerated well without discomfort.  Sacrum unstageable wound has improved since previous assessment; 25% yellow slough, 75% red, small amt yellow drainage, no odor. Continue Santyl for enzymatic debridement of nonviable tissue.  WOC nurse team will follow along with you for continued NPWT VAC dressing changes Q M/W/F during admission due to complexity of dressing with bridging.  Air mattress in place to decrease pressure. Thank-you,  Cammie Mcgee MSN, RN, CWOCN, [place], CNS [phone]

## 2016-05-10 LAB — BASIC METABOLIC PANEL
Anion gap: 8 (ref 5–15)
BUN: 30 mg/dL — ABNORMAL HIGH (ref 6–20)
CHLORIDE: 98 mmol/L — AB (ref 101–111)
CO2: 28 mmol/L (ref 22–32)
CREATININE: 0.96 mg/dL (ref 0.61–1.24)
Calcium: 9.1 mg/dL (ref 8.9–10.3)
GFR calc Af Amer: >60 mL/min (ref >60)
GFR calc non Af Amer: >60 mL/min (ref >60)
Glucose, Bld: 98 mg/dL (ref 65–99)
POTASSIUM: 4.3 mmol/L (ref 3.5–5.1)
Sodium: 134 mmol/L — ABNORMAL LOW (ref 135–145)
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
## 2016-05-11 NOTE — Consult Note (Signed)
WOC Nurse wound follow up Wound type: Stage 4 Pressure Injury right ischium, unchanged in appearance since previous consult. Dressing procedure/placement/frequency: 1pc of black foam used to fill the right ischial wound, skin protected to the right hip, 1pc of black foam used to bridge the dressing/TRAC pad to the right hip. 1/2 pc of ostomy barrier ring used to flatten crease distal to the wound bed, covered with VAC drape and sealed at . Pt tolerated well without discomfort.  Sacrum unstageable unchanged since previous assessment; Continue Santyl for enzymatic debridement of nonviable tissue.  WOC nurse team will follow along with you for continued NPWT VAC dressing changes Q M/W/F during admission due to complexity of dressing with bridging. Air mattress in placeto decrease pressure. Thank-you,  Cammie Mcgee MSN, RN, CWOCN, [place], CNS [phone]

## 2016-05-11 NOTE — Progress Notes (Signed)
Late Entry: Dressing changed to L hip and sacral area with Santyl and foam. Dressings appear to be healing and had very little drainage. Will continue to monitor pt closely. Jillyn Hidden, RN
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
## 2016-05-12 ENCOUNTER — Non-Acute Institutional Stay (SKILLED_NURSING_FACILITY): Payer: Medicaid Other | Admitting: Internal Medicine

## 2016-05-12 ENCOUNTER — Inpatient Hospital Stay
Admission: RE | Admit: 2016-05-12 | Discharge: 2016-06-04 | Disposition: A | Discharge status: Home / Self Care | Payer: No Typology Code available for payment source | Source: Ambulatory Visit | Attending: Internal Medicine | Admitting: Internal Medicine

## 2016-05-12 DIAGNOSIS — I10 Essential (primary) hypertension: Secondary | ICD-10-CM | POA: Diagnosis not present

## 2016-05-12 DIAGNOSIS — F329 Major depressive disorder, single episode, unspecified: Secondary | ICD-10-CM | POA: Diagnosis not present

## 2016-05-12 DIAGNOSIS — M869 Osteomyelitis, unspecified: Secondary | ICD-10-CM

## 2016-05-12 DIAGNOSIS — F32A Depression, unspecified: Secondary | ICD-10-CM

## 2016-05-12 DIAGNOSIS — N39498 Other specified urinary incontinence: Secondary | ICD-10-CM

## 2016-05-12 LAB — VANCOMYCIN, TROUGH: VANCOMYCIN TR: 17 ug/mL (ref 15–20)

## 2016-05-12 MED ORDER — VANCOMYCIN HCL 10 G IV SOLR: 1250.0000 mg | INTRAVENOUS | 0 refills | Status: DC

## 2016-05-12 MED ORDER — DEXTROSE 5 % IV SOLN: 2.0000 g | INTRAVENOUS | 0 refills | Status: DC
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
## 2016-05-12 NOTE — Clinical Social Work Note (Signed)
Handoff provided to weekend staff. Per CSW AD, the patient now has a bed at [facility].   Roddie Mc MSW, [place], [place], [phone]

## 2016-05-12 NOTE — Discharge Instructions (Signed)
Thank you for trusting [place] with your medical care!  You were hospitalized for bone infection (osteomyelitis) and treated with antibiotics.    Please take note of the following changes to your medications: Keep taking Vancomycin and ceftriaxone until September 9th  Keep taking your medications from before your hospitalization   To make sure you are getting better, please make it to the follow-up appointments listed on the first page.  If you have any questions, please call [phone].

## 2016-05-12 NOTE — Progress Notes (Signed)
Pharmacy Antibiotic Note  Cameron Zavala is a 40 y.o. male admitted on 05/03/2016 with osteomyelitis.  Pharmacy has been consulted for Vancomycin dosing.  Antimicrobials this admission: Vanc 7/13>> Rocephin 7/19> Zosyn 7/13>>7/19  Dose adjustments this admission: 8/8 VT (@ [facility]) = 16.9 on 1g IV q12 (SCr 0.92) 8/13 VT = 52 on 1g IV q12 (SCr 0.81) 8/13 VR = 40 (SCr 1.16) 8/14 VR = 14 (SCr 1.12)--Vancomycin 1000 mg IV x 1 given 8/15 VR = 12  8/19 VT = 17 --Continue vancomycin 1250 mg IV q24h  Plan: -Will plan to continue Vancomycin 1250 mg IV q24h -Re-check trough with any significant clinical or lab changes  Height: 5\' 4"  (162.6 cm) Weight: 133 lb 7.9 oz (60.6 kg) IBW/kg (Calculated) : 59.2  Temp (24hrs), Avg:98.4 F (36.9 C), Min:98.2 F (36.8 C), Max:98.7 F (37.1 C)   Recent Labs Lab 05/05/16 0835 05/06/16 0400  05/06/16 1820 05/07/16 0535 05/07/16 2115 05/08/16 2222 05/10/16 0520 05/12/16 0122  CREATININE 0.81  --   --  1.16 1.12  --   --  0.96  --   VANCOTROUGH  --  52*  --   --   --   --   --   --  17  VANCORANDOM  --   --   < > 40  --  14 12  --   --   < > = values in this interval not displayed.  Estimated Creatinine Clearance: 85.6 mL/min (by C-G formula based on SCr of 0.96 mg/dL).     Cameron Zavala 05/12/2016 1:56 AM
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
## 2016-05-12 NOTE — Progress Notes (Addendum)
Cm called Cameron Zavala with [employer] for wound vac setup for charity and she will call CM back with more information if they can assist. CM called equipment services to deliver the portable wound vac to the bedside.

## 2016-05-12 NOTE — Clinical Social Work Note (Signed)
CSW notified that pt medically cleared to discharge to SNF. CSW informed pt that pt discharging to SNF. CSW Contacted SNF and informed that pt discharging and will arrive via [facility]. CSW contacted [facility] and arranged for transport. CSW requested nurse call SNF and give report.CSW  faxed discharge summary and orders to SNF.

## 2016-05-12 NOTE — Progress Notes (Signed)
Patient discharged to [facility], report called to [facility], patient foley bag was leaking, changed out the bag, hooked patient up to the wound vac that was delivered.

## 2016-05-15 ENCOUNTER — Ambulatory Visit: Payer: No Typology Code available for payment source | Admitting: Internal Medicine
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
## 2016-05-16 ENCOUNTER — Non-Acute Institutional Stay (SKILLED_NURSING_FACILITY): Payer: Medicaid Other | Admitting: Internal Medicine

## 2016-05-16 ENCOUNTER — Encounter: Payer: Self-pay | Admitting: Internal Medicine

## 2016-05-16 ENCOUNTER — Encounter (HOSPITAL_COMMUNITY)
Admission: RE | Admit: 2016-05-16 | Discharge: 2016-05-16 | Disposition: A | Discharge status: Home / Self Care | Payer: Medicaid Other | Source: Skilled Nursing Facility | Attending: Internal Medicine | Admitting: Internal Medicine

## 2016-05-16 DIAGNOSIS — G8220 Paraplegia, unspecified: Secondary | ICD-10-CM | POA: Insufficient documentation

## 2016-05-16 DIAGNOSIS — G839 Paralytic syndrome, unspecified: Secondary | ICD-10-CM | POA: Diagnosis not present

## 2016-05-16 DIAGNOSIS — D649 Anemia, unspecified: Secondary | ICD-10-CM | POA: Diagnosis not present

## 2016-05-16 DIAGNOSIS — F32A Depression, unspecified: Secondary | ICD-10-CM

## 2016-05-16 DIAGNOSIS — M869 Osteomyelitis, unspecified: Secondary | ICD-10-CM

## 2016-05-16 DIAGNOSIS — I10 Essential (primary) hypertension: Secondary | ICD-10-CM

## 2016-05-16 DIAGNOSIS — Z5189 Encounter for other specified aftercare: Secondary | ICD-10-CM | POA: Insufficient documentation

## 2016-05-16 DIAGNOSIS — L89150 Pressure ulcer of sacral region, unstageable: Secondary | ICD-10-CM | POA: Insufficient documentation

## 2016-05-16 DIAGNOSIS — F329 Major depressive disorder, single episode, unspecified: Secondary | ICD-10-CM

## 2016-05-16 DIAGNOSIS — Z978 Presence of other specified devices: Secondary | ICD-10-CM | POA: Insufficient documentation

## 2016-05-16 DIAGNOSIS — G4700 Insomnia, unspecified: Secondary | ICD-10-CM | POA: Diagnosis not present

## 2016-05-16 DIAGNOSIS — A079 Protozoal intestinal disease, unspecified: Secondary | ICD-10-CM | POA: Insufficient documentation

## 2016-05-16 DIAGNOSIS — M4620 Osteomyelitis of vertebra, site unspecified: Secondary | ICD-10-CM | POA: Insufficient documentation

## 2016-05-16 DIAGNOSIS — F3289 Other specified depressive episodes: Secondary | ICD-10-CM | POA: Insufficient documentation

## 2016-05-16 LAB — BASIC METABOLIC PANEL
Anion gap: 7 (ref 5–15)
BUN: 29 mg/dL — ABNORMAL HIGH (ref 6–20)
CHLORIDE: 98 mmol/L — AB (ref 101–111)
CO2: 28 mmol/L (ref 22–32)
CREATININE: 0.95 mg/dL (ref 0.61–1.24)
Calcium: 9 mg/dL (ref 8.9–10.3)
GFR calc non Af Amer: >60 mL/min (ref >60)
Glucose, Bld: 100 mg/dL — ABNORMAL HIGH (ref 65–99)
POTASSIUM: 4.4 mmol/L (ref 3.5–5.1)
Sodium: 133 mmol/L — ABNORMAL LOW (ref 135–145)

## 2016-05-16 LAB — VANCOMYCIN, TROUGH: Vancomycin Tr: 14 ug/mL — ABNORMAL LOW (ref 15–20)
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
## 2016-05-18 ENCOUNTER — Encounter (HOSPITAL_COMMUNITY)
Admission: RE | Admit: 2016-05-18 | Discharge: 2016-05-18 | Disposition: A | Discharge status: Home / Self Care | Payer: Medicaid Other | Source: Skilled Nursing Facility | Attending: *Deleted | Admitting: *Deleted

## 2016-05-18 LAB — BASIC METABOLIC PANEL
Anion gap: 8 (ref 5–15)
BUN: 29 mg/dL — AB (ref 6–20)
CHLORIDE: 98 mmol/L — AB (ref 101–111)
CO2: 30 mmol/L (ref 22–32)
Calcium: 9.7 mg/dL (ref 8.9–10.3)
Creatinine, Ser: 1.07 mg/dL (ref 0.61–1.24)
GFR calc Af Amer: >60 mL/min (ref >60)
GFR calc non Af Amer: >60 mL/min (ref >60)
GLUCOSE: 83 mg/dL (ref 65–99)
POTASSIUM: 5 mmol/L (ref 3.5–5.1)
SODIUM: 136 mmol/L (ref 135–145)

## 2016-05-18 LAB — OCCULT BLOOD X 1 CARD TO LAB, STOOL
Fecal Occult Bld: NEGATIVE
Fecal Occult Bld: NEGATIVE

## 2016-05-18 LAB — VANCOMYCIN, TROUGH: VANCOMYCIN TR: 14 ug/mL — AB (ref 15–20)

## 2016-05-19 ENCOUNTER — Encounter (HOSPITAL_COMMUNITY)
Admission: AD | Admit: 2016-05-19 | Discharge: 2016-05-19 | Disposition: A | Discharge status: Home / Self Care | Payer: Medicaid Other | Source: Skilled Nursing Facility | Attending: *Deleted | Admitting: *Deleted

## 2016-05-19 LAB — OCCULT BLOOD X 1 CARD TO LAB, STOOL: Fecal Occult Bld: NEGATIVE

## 2016-05-23 ENCOUNTER — Other Ambulatory Visit (HOSPITAL_COMMUNITY)
Admission: RE | Admit: 2016-05-23 | Discharge: 2016-05-23 | Disposition: A | Discharge status: Home / Self Care | Payer: No Typology Code available for payment source | Source: Skilled Nursing Facility | Attending: Internal Medicine | Admitting: Internal Medicine

## 2016-05-23 DIAGNOSIS — E785 Hyperlipidemia, unspecified: Secondary | ICD-10-CM | POA: Insufficient documentation

## 2016-05-23 LAB — CBC WITH DIFFERENTIAL/PLATELET
BASOS ABS: 0 10*3/uL (ref 0.0–0.1)
BASOS PCT: 0 %
EOS PCT: 2 %
Eosinophils Absolute: 0.1 10*3/uL (ref 0.0–0.7)
HEMATOCRIT: 35.2 % — AB (ref 39.0–52.0)
Hemoglobin: 11.3 g/dL — ABNORMAL LOW (ref 13.0–17.0)
LYMPHS PCT: 24 %
Lymphs Abs: 1.1 10*3/uL (ref 0.7–4.0)
MCH: 24.7 pg — ABNORMAL LOW (ref 26.0–34.0)
MCHC: 32.1 g/dL (ref 30.0–36.0)
MCV: 77 fL — AB (ref 78.0–100.0)
MONO ABS: 0.5 10*3/uL (ref 0.1–1.0)
Monocytes Relative: 11 %
NEUTROS ABS: 2.8 10*3/uL (ref 1.7–7.7)
Neutrophils Relative %: 63 %
PLATELETS: 238 10*3/uL (ref 150–400)
RBC: 4.57 MIL/uL (ref 4.22–5.81)
RDW: 18 % — AB (ref 11.5–15.5)
WBC: 4.5 10*3/uL (ref 4.0–10.5)

## 2016-05-23 LAB — BASIC METABOLIC PANEL
ANION GAP: 8 (ref 5–15)
BUN: 28 mg/dL — ABNORMAL HIGH (ref 6–20)
CALCIUM: 9.2 mg/dL (ref 8.9–10.3)
CO2: 28 mmol/L (ref 22–32)
Chloride: 99 mmol/L — ABNORMAL LOW (ref 101–111)
Creatinine, Ser: 0.95 mg/dL (ref 0.61–1.24)
GFR calc Af Amer: >60 mL/min (ref >60)
Glucose, Bld: 90 mg/dL (ref 65–99)
POTASSIUM: 4.9 mmol/L (ref 3.5–5.1)
SODIUM: 135 mmol/L (ref 135–145)

## 2016-05-23 LAB — VANCOMYCIN, TROUGH: VANCOMYCIN TR: 13 ug/mL — AB (ref 15–20)

## 2016-05-27 ENCOUNTER — Encounter (HOSPITAL_COMMUNITY)
Admission: RE | Admit: 2016-05-27 | Discharge: 2016-05-27 | Disposition: A | Discharge status: Home / Self Care | Payer: No Typology Code available for payment source | Source: Skilled Nursing Facility | Attending: Internal Medicine | Admitting: Internal Medicine

## 2016-05-27 LAB — BASIC METABOLIC PANEL
ANION GAP: 9 (ref 5–15)
BUN: 29 mg/dL — ABNORMAL HIGH (ref 6–20)
CHLORIDE: 97 mmol/L — AB (ref 101–111)
CO2: 29 mmol/L (ref 22–32)
Calcium: 9.6 mg/dL (ref 8.9–10.3)
Creatinine, Ser: 1.11 mg/dL (ref 0.61–1.24)
GFR calc non Af Amer: >60 mL/min (ref >60)
Glucose, Bld: 108 mg/dL — ABNORMAL HIGH (ref 65–99)
Potassium: 4.6 mmol/L (ref 3.5–5.1)
Sodium: 135 mmol/L (ref 135–145)

## 2016-05-27 LAB — VANCOMYCIN, TROUGH: Vancomycin Tr: 14 ug/mL — ABNORMAL LOW (ref 15–20)

## 2016-06-02 ENCOUNTER — Non-Acute Institutional Stay (SKILLED_NURSING_FACILITY): Payer: Medicaid Other | Admitting: Internal Medicine

## 2016-06-02 DIAGNOSIS — I10 Essential (primary) hypertension: Secondary | ICD-10-CM | POA: Diagnosis not present

## 2016-06-02 DIAGNOSIS — M869 Osteomyelitis, unspecified: Secondary | ICD-10-CM

## 2016-06-02 DIAGNOSIS — D649 Anemia, unspecified: Secondary | ICD-10-CM

## 2016-06-02 DIAGNOSIS — N39498 Other specified urinary incontinence: Secondary | ICD-10-CM
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
## 2016-06-07 ENCOUNTER — Ambulatory Visit (INDEPENDENT_AMBULATORY_CARE_PROVIDER_SITE_OTHER): Payer: No Typology Code available for payment source | Admitting: Internal Medicine

## 2016-06-07 ENCOUNTER — Ambulatory Visit (HOSPITAL_COMMUNITY)
Admission: RE | Admit: 2016-06-07 | Discharge: 2016-06-07 | Disposition: A | Discharge status: Home / Self Care | Payer: Medicaid Other | Source: Ambulatory Visit | Attending: Internal Medicine | Admitting: Internal Medicine

## 2016-06-07 VITALS — BP 153/95 | HR 81 | Temp 98.3°F

## 2016-06-07 DIAGNOSIS — I10 Essential (primary) hypertension: Secondary | ICD-10-CM

## 2016-06-07 DIAGNOSIS — L8990 Pressure ulcer of unspecified site, unspecified stage: Secondary | ICD-10-CM

## 2016-06-07 DIAGNOSIS — Z993 Dependence on wheelchair: Secondary | ICD-10-CM

## 2016-06-07 DIAGNOSIS — Z09 Encounter for follow-up examination after completed treatment for conditions other than malignant neoplasm: Secondary | ICD-10-CM

## 2016-06-07 DIAGNOSIS — R29898 Other symptoms and signs involving the musculoskeletal system: Secondary | ICD-10-CM

## 2016-06-07 DIAGNOSIS — Z23 Encounter for immunization: Secondary | ICD-10-CM

## 2016-06-07 DIAGNOSIS — M86659 Other chronic osteomyelitis, unspecified thigh: Secondary | ICD-10-CM

## 2016-06-07 DIAGNOSIS — Q799 Congenital malformation of musculoskeletal system, unspecified: Secondary | ICD-10-CM | POA: Diagnosis not present

## 2016-06-07 DIAGNOSIS — Z0000 Encounter for general adult medical examination without abnormal findings: Secondary | ICD-10-CM

## 2016-06-07 DIAGNOSIS — M898X9 Other specified disorders of bone, unspecified site: Secondary | ICD-10-CM

## 2016-06-07 DIAGNOSIS — B9689 Other specified bacterial agents as the cause of diseases classified elsewhere: Secondary | ICD-10-CM

## 2016-06-07 DIAGNOSIS — M869 Osteomyelitis, unspecified: Secondary | ICD-10-CM

## 2016-06-07 DIAGNOSIS — G8220 Paraplegia, unspecified: Secondary | ICD-10-CM

## 2016-06-07 DIAGNOSIS — L89219 Pressure ulcer of right hip, unspecified stage: Secondary | ICD-10-CM

## 2016-06-07 MED ORDER — CITALOPRAM HYDROBROMIDE 20 MG PO TABS: 20.0000 mg | ORAL_TABLET | ORAL | 3 refills | Status: DC

## 2016-06-07 MED ORDER — VITAMIN D 1000 UNITS PO TABS: 1000.0000 [IU] | ORAL_TABLET | Freq: Every day | ORAL | 11 refills | Status: DC

## 2016-06-07 MED ORDER — LISINOPRIL-HYDROCHLOROTHIAZIDE 20-25 MG PO TABS: 1.0000 | ORAL_TABLET | ORAL | 3 refills | Status: DC

## 2016-06-07 MED ORDER — ENSURE ENLIVE PO LIQD: 237.0000 mL | Freq: Three times a day (TID) | ORAL | Status: DC

## 2016-06-07 NOTE — Assessment & Plan Note (Signed)
His wife was concerned about a small bony growth on his right upper lateral leg. He denies any redness or tenderness.  On exam.He has this hard looks like a bony growth on lateral side of his right fibula below neck. No erythema, no tenderness.  -We will do x-ray of his right leg.

## 2016-06-07 NOTE — Assessment & Plan Note (Signed)
He has a long history of complicated osteomyelitis and recently completed his antibiotic course. He is afebrile and without any complaints. His wounds are healing well.

## 2016-06-07 NOTE — Assessment & Plan Note (Signed)
His ulcers are healing, he just had one bandage  on his right lateral hip. He does not required wound VAC anymore. He has home health nurses coming to take care of that at home.
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
## 2016-06-07 NOTE — Assessment & Plan Note (Signed)
We provided him with flu shot today

## 2016-06-07 NOTE — Assessment & Plan Note (Signed)
BP Readings from Last 3 Encounters:  06/07/16 (!) 153/95  05/16/16 118/66  05/12/16 (!) 124/59   His blood pressure was high today. He states that his blood pressure is being checked regularly during his stay at nursing home and its being always normal.  Continue with the same management and reassess on his next follow-up.

## 2016-06-07 NOTE — Patient Instructions (Addendum)
It was pleasure taking care of you today. I'm glad to see that you are doing so well. Your blood pressure was little high today, please bring in this blood pressure log during your next follow-up visit. Try to eat healthy low sodium diet. I am sending you for an x-ray for that growth on your leg. Please come back in 2 weeks to recheck your blood pressure.

## 2016-06-08 ENCOUNTER — Encounter: Payer: Self-pay | Admitting: Licensed Clinical Social Worker

## 2016-06-08 NOTE — Progress Notes (Signed)
Internal Medicine Clinic Attending  I saw and evaluated the patient.  I personally confirmed the key portions of the history and exam documented by Dr. Amin and I reviewed pertinent patient test results.  The assessment, diagnosis, and plan were formulated together and I agree with the documentation in the resident's note. 

## 2016-06-08 NOTE — Progress Notes (Signed)
CSW met with [name] and spouse.  Spouse has requested resources on in-home care/physical therapy services.  Pt is current with [facility] for [facility] RN for wound care; spouse inquiring if pt could receive referral for [facility] PT/OT to increase Cameron Zavala level of independence.  Spouse states she was informed she only needed the physicians order.  CSW discussed at this time Cameron Zavala does not have a payor source for [facility] services and is currently receiving charity care for the [facility] [employer] RN.  If referral made to [place] for [facility] PT/OT, pt would receive a bill for payment.  Pt and spouse state they are unable to pay for additional services.  CSW placed call to [facility] to inquire if additional services could be added on under charity care.  Unfortunately, [place] states pt's case was received only for [facility] RN and additional services would be at cost.  Pt and family decline additional cost for services at this time. CSW provided pt and spouse information on [employer] as this program provides CNA/RN based on sliding fee scale/income.  CSW encouraged pt/spouse to contact agency to be placed on waitlist.  Brochure provided.  Pt and spouse deny additional social work needs at this time.

## 2016-06-15 ENCOUNTER — Encounter: Payer: Self-pay | Admitting: Internal Medicine

## 2016-06-18 ENCOUNTER — Telehealth: Payer: Self-pay | Admitting: Internal Medicine

## 2016-06-18 NOTE — Telephone Encounter (Signed)
Order to continue visits for wound care.

## 2016-06-20 NOTE — Telephone Encounter (Signed)
Received call from Jolee Ewing, RN ([facility])-she is requesting a verbal order to continue wound care for 2 times weekly until mid November as pt's wife is legally blind and will not be able to notice changes to the wound bed. States pt's wound does not appear to be healing well, but does not appear to be infected.  It's not warm, red, or angry, but states it may need more than the saline packing dressing that she is currently doing.  Pt has an appt with [facility] on 10/2.  French Ana will continue to monitor wound and keep our office update.  She will also have pt  contact the office for an appt if/when needed.   She also took pt's bp and it was 142/76.  Will forward to pcp to make him aware.Kingsley Spittle Cassady9/27/201710:03 AM  Verbal order given to continue wound care

## 2016-06-20 NOTE — Telephone Encounter (Signed)
Thank you.. I agree with the verbal order. Please ask patient to follow up in clinic if worsening wound

## 2016-06-21 ENCOUNTER — Ambulatory Visit: Payer: No Typology Code available for payment source

## 2016-06-25 ENCOUNTER — Encounter (HOSPITAL_BASED_OUTPATIENT_CLINIC_OR_DEPARTMENT_OTHER): Payer: Medicaid Other | Attending: Internal Medicine

## 2016-06-25 DIAGNOSIS — L89322 Pressure ulcer of left buttock, stage 2: Secondary | ICD-10-CM | POA: Insufficient documentation

## 2016-06-25 DIAGNOSIS — F17210 Nicotine dependence, cigarettes, uncomplicated: Secondary | ICD-10-CM | POA: Diagnosis not present

## 2016-06-25 DIAGNOSIS — G8221 Paraplegia, complete: Secondary | ICD-10-CM | POA: Diagnosis not present

## 2016-06-25 DIAGNOSIS — Z933 Colostomy status: Secondary | ICD-10-CM | POA: Diagnosis not present

## 2016-06-25 DIAGNOSIS — I10 Essential (primary) hypertension: Secondary | ICD-10-CM | POA: Insufficient documentation

## 2016-06-25 DIAGNOSIS — L89314 Pressure ulcer of right buttock, stage 4: Secondary | ICD-10-CM | POA: Insufficient documentation

## 2016-06-27 ENCOUNTER — Telehealth: Payer: Self-pay | Admitting: Licensed Clinical Social Worker

## 2016-06-27 NOTE — Telephone Encounter (Signed)
CSW received FL-2 request, concern noted as form states completed by facility doctor.  Pt is currently at home.  CSW placed all to caseworker listed on form.  Case worker states FL-2 information is needed from pt's April 2017 hospitalization.  Upon chart review, Mr. Greiss was admitted to [facility] for this hospitalization.  Case Worker instructed CSW to notify family FL-2 needs to be completed by [facility] physician. CSW placed call to Ms. Ponce.  Spouse notified of information above.  CSW offered to fax form to [facility] on behalf of Mr. Hentz once this worker is provided with a fax number.  Spouse to look into a fax number for [facility] social work department and notify this worker.

## 2016-07-03 DIAGNOSIS — L89314 Pressure ulcer of right buttock, stage 4: Secondary | ICD-10-CM | POA: Diagnosis not present

## 2016-07-17 DIAGNOSIS — L89314 Pressure ulcer of right buttock, stage 4: Secondary | ICD-10-CM | POA: Diagnosis not present

## 2016-07-18 ENCOUNTER — Telehealth: Payer: Self-pay | Admitting: *Deleted

## 2016-07-18 NOTE — Telephone Encounter (Signed)
Sounds appropriate.

## 2016-07-18 NOTE — Telephone Encounter (Signed)
[facility] traci calls and states that pt's urine has changed in appearance, it is getting "thick" and very cloudy, "pearly".she ask to do a u/a and poss c&s, verbal approval is given as she states his urine is looking completely different from normal. Denies fevers, c/o pain. Also appt is made with dr patel for this week. He will arrange transportation today Do you approve?

## 2016-07-18 NOTE — Telephone Encounter (Signed)
Urinalysis collected by home health because of change in urine color and odor. Patient is hemiplegic with neurogenic bladder and has an indwelling catheter, making the UA difficult to interpret. It has leukocytes, erythrocytes, and many bacteria. Unclear if this is symptomatic at this point. We expect these catheters to become colonized and cause some amount of inflammation chronically. I would only advice treating if he has new symptoms like fever, malaise, or abdominal pain. We should offer him an [facility] appointment to discuss, or he can call and make an appt if he has any of these symptoms.

## 2016-07-19 ENCOUNTER — Telehealth: Payer: Self-pay | Admitting: Internal Medicine

## 2016-07-19 NOTE — Telephone Encounter (Signed)
APT. REMINDER CALL, LMTCB °
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
## 2016-07-19 NOTE — Telephone Encounter (Signed)
Called pt, spoke w/ pt's wife, she states they will be at appt tomorrow, pt is no worse

## 2016-07-19 NOTE — Telephone Encounter (Signed)
Left message for nurse, pt has appt tomorrow

## 2016-07-20 ENCOUNTER — Encounter: Payer: Self-pay | Admitting: Internal Medicine

## 2016-07-20 ENCOUNTER — Ambulatory Visit (INDEPENDENT_AMBULATORY_CARE_PROVIDER_SITE_OTHER): Payer: No Typology Code available for payment source | Admitting: Internal Medicine

## 2016-07-20 VITALS — BP 142/80 | HR 101 | Temp 99.1°F

## 2016-07-20 DIAGNOSIS — Z960 Presence of urogenital implants: Secondary | ICD-10-CM

## 2016-07-20 DIAGNOSIS — M549 Dorsalgia, unspecified: Secondary | ICD-10-CM

## 2016-07-20 DIAGNOSIS — G8929 Other chronic pain: Secondary | ICD-10-CM

## 2016-07-20 DIAGNOSIS — L89229 Pressure ulcer of left hip, unspecified stage: Secondary | ICD-10-CM

## 2016-07-20 DIAGNOSIS — Z978 Presence of other specified devices: Secondary | ICD-10-CM | POA: Insufficient documentation

## 2016-07-20 DIAGNOSIS — R109 Unspecified abdominal pain: Secondary | ICD-10-CM

## 2016-07-20 DIAGNOSIS — L89219 Pressure ulcer of right hip, unspecified stage: Secondary | ICD-10-CM

## 2016-07-20 DIAGNOSIS — Z2239 Carrier of other specified bacterial diseases: Secondary | ICD-10-CM

## 2016-07-20 DIAGNOSIS — L8940 Pressure ulcer of contiguous site of back, buttock and hip, unspecified stage: Secondary | ICD-10-CM

## 2016-07-20 MED ORDER — TRAMADOL HCL 50 MG PO TABS: 50.0000 mg | ORAL_TABLET | Freq: Every evening | ORAL | 0 refills | Status: DC | PRN

## 2016-07-20 NOTE — Assessment & Plan Note (Signed)
Assessment Home health RN called 07/18/16 regarding a change in his urine. Per review of the notes, it was described as "thick, cloudy, pearly." A urinalysis that was checked was notable for leukocytes, erythrocytes, many bacteria.  I independently reviewed the urine culture which was processed today and notable for Proteus that is pan sensitive to cephalosporins, fluoroquinolones, Bactrim though resistant to nitrofurantoin.  He denies any dysuria, abdominal pain, nausea or other systemic signs to suggest an acute infection. His physical exam findings were also reassuring for no acute infection, and I suspect he may be leaking urine which is being confused with pus though the consistency is different.  Plan -Reassured him and his wife that he does not have a urinary infection -Counseled them on monitoring for signs of his brewing infection which may include fever, chills, nausea, vomiting -Counseled him and his wife that chronic bacterial colonization is to be expected with an indwelling catheter -Spoke with home health RN agency which confirms that there is an order in place to have the catheter changed monthly from his wound care doctor

## 2016-07-20 NOTE — Patient Instructions (Signed)
Please keep up the good work with wound care.  For the catheter, please make sure it's changed once a month.   If you start having fevers, ab pain, burning with peeing, please give [place] a call.

## 2016-07-20 NOTE — Assessment & Plan Note (Signed)
Assessment He is having difficulty sleeping at night because of the pain he experiences in his back while laying supine. It is worse on his right side as compared to the left. He felt the tramadol he received in the hospital alleviated his pain. He has had no relief with acetaminophen or ibuprofen  In the absence of signs and symptoms to suggest worsening infection, I think his pain is related to his ulcer and bed positioning though suspect some component is chronic given what I have documented in prior notes.  Plan -Counseled the patient and his wife that long-term opiate therapy would not be in either of their interest given the risks and adverse events associated to which they acknowledged understanding. They are agreeable to a short course prescription with the goal of better sleep at night -Prescribed tramadol 50 mg 15 tablets to be taken at bedtime as needed

## 2016-07-20 NOTE — Assessment & Plan Note (Signed)
Assessment His wife notes that he is now being seen by Dr. Leanord Hawking for wound care, most recently 2 weeks ago. He debrided the left ulcer and recommended restarting the wound VAC on the right ulcer. Home health RN is applying wet-to-dry dressings. He denies any fever, worsening pain, drainage from any of his ulcers.  Plan -Continue home health RN Wound Care

## 2016-07-24 ENCOUNTER — Telehealth: Payer: Self-pay | Admitting: *Deleted

## 2016-07-24 NOTE — Progress Notes (Signed)
Internal Medicine Clinic Attending  Case discussed with Dr. Patel,Rushil at the time of the visit.  We reviewed the resident's history and exam and pertinent patient test results.  I agree with the assessment, diagnosis, and plan of care documented in the resident's note.  

## 2016-07-24 NOTE — Telephone Encounter (Signed)
Dr patel, will put C&S results in your box and give copy to attending

## 2016-07-25 NOTE — Telephone Encounter (Addendum)
HHN calls today 11/1 has had temp low grade for 2 days 99.5 to 100.0. Pt states no additional complaints. HH states wife is sick w/ bronchitis but is using masks and staying as far away as possible. Wounds looking bigger and no improvement

## 2016-07-25 NOTE — Telephone Encounter (Signed)
If he wants to be seen, I will be here this month. So long as he is not having symptoms unrelated to bronchitis, we should be okay.

## 2016-07-26 NOTE — Telephone Encounter (Signed)
Tried to call and got no answer nor vmail

## 2016-08-01 ENCOUNTER — Telehealth: Payer: Self-pay

## 2016-08-01 NOTE — Telephone Encounter (Signed)
French Ana from [facility] requesting VO. Please call pt back.

## 2016-08-02 ENCOUNTER — Telehealth: Payer: Self-pay

## 2016-08-02 NOTE — Telephone Encounter (Signed)
Cameron Zavala, [facility] calls for approval of VO for continued care for the next 60 days, given, do you agree?

## 2016-08-02 NOTE — Telephone Encounter (Signed)
I agree, yes.

## 2016-08-02 NOTE — Telephone Encounter (Signed)
Cameron Zavala from [facility] requesting VO.

## 2016-08-06 ENCOUNTER — Encounter (HOSPITAL_BASED_OUTPATIENT_CLINIC_OR_DEPARTMENT_OTHER): Payer: Medicaid Other | Attending: Internal Medicine

## 2016-08-06 DIAGNOSIS — G8220 Paraplegia, unspecified: Secondary | ICD-10-CM | POA: Diagnosis not present

## 2016-08-06 DIAGNOSIS — L89314 Pressure ulcer of right buttock, stage 4: Secondary | ICD-10-CM | POA: Insufficient documentation

## 2016-08-06 DIAGNOSIS — I10 Essential (primary) hypertension: Secondary | ICD-10-CM | POA: Diagnosis not present

## 2016-08-06 DIAGNOSIS — L89322 Pressure ulcer of left buttock, stage 2: Secondary | ICD-10-CM | POA: Insufficient documentation

## 2016-08-09 ENCOUNTER — Telehealth: Payer: Self-pay | Admitting: Internal Medicine

## 2016-08-09 ENCOUNTER — Telehealth: Payer: Self-pay | Admitting: Licensed Clinical Social Worker

## 2016-08-09 DIAGNOSIS — L8940 Pressure ulcer of contiguous site of back, buttock and hip, unspecified stage: Secondary | ICD-10-CM

## 2016-08-09 NOTE — Telephone Encounter (Signed)
Rec'd call from Pt's wife requesting the Adjustable Over the bed Table.  Per [facility] this will not be covered under his [employer].  This will be an out of pocket expense.  FYI- Called her back to notify her of this information and she understood.

## 2016-08-09 NOTE — Telephone Encounter (Signed)
I think that is reasonable. I am placing the order now.

## 2016-08-09 NOTE — Telephone Encounter (Signed)
CSW received voice mail from Mrs. Cameron Zavala. Cameron Zavala.  Spouse states patient has Medicaid coverage and request HH PT/OT to assist with education/training for bathroom ADL's to increase pt's level of independence.  [name] has been seen in [facility] within 30 days.  CSW will forward request to PCP.

## 2016-08-09 NOTE — Addendum Note (Signed)
Addended by: Beather Arbour on: 08/09/2016 01:16 PM   Modules accepted: Orders

## 2016-08-09 NOTE — Telephone Encounter (Signed)
Returned call to Ms. Ponce.  Spouse aware request for [facility] PT has been forwarded to PCP.  Spouse also requesting DME order for Adjustable bed tray table for patient.  Spouse states patient has been informed to remain off wound and on mattress/hospital bed, pt in need of tray table to eat in bed.  CSW notified spouse this may not be a covered item and inquired if this was still an item they would like if private pay.  Spouse states pt would like item even if insurance does not cover.

## 2016-08-10 ENCOUNTER — Telehealth: Payer: Self-pay | Admitting: Licensed Clinical Social Worker

## 2016-08-10 ENCOUNTER — Other Ambulatory Visit: Payer: Self-pay | Admitting: Licensed Clinical Social Worker

## 2016-08-10 DIAGNOSIS — G8220 Paraplegia, unspecified: Secondary | ICD-10-CM

## 2016-08-10 NOTE — Telephone Encounter (Signed)
During telephone call with Cameron Zavala regarding community services, spouse inquired about patient receiving a prescription for a medication Cameron Zavala received during his stay at [facility]. Spouse states she can not remember the name but thinks it started with a "Pro".  Spouse states it was "condensed protein.  He only needed to drink a little bit of it."  Spouse states pt currently takes Ensure.  Spouse notified this request would be forwarded to PCP

## 2016-08-10 NOTE — Telephone Encounter (Signed)
Call placed to [facility] for add on [facility] PT/OT eval.  [facility] declined to accept, stating not covered under pt's Medicaid benefit.  CSW notified spouse, spouse would like to remain with [facility] as pt/family have been satisfied with [facility] RN.  CSW discussed request for PCS, spouse agreeable.  DMA 3051 completed, signed and faxed to [facility].  Spouse made aware of transportation benefit under [place] Medicaid, spouse requesting information to be mailed.

## 2016-08-14 NOTE — Telephone Encounter (Signed)
I reviewed his chart and was unable to find this protein supplement.

## 2016-08-15 MED ORDER — PRO-STAT 64 PO LIQD: 30.0000 mL | Freq: Three times a day (TID) | ORAL | 3 refills | Status: DC

## 2016-08-15 NOTE — Telephone Encounter (Signed)
I see that he was on Ensure Enlive three times a day on 06/02/2016 in a nursing home which is 250 ml, enhanced liquid supplement with 20 grams protein each.  Prostat is a protein supplement that comes in a 30 ml amounts and contains 15 g protein- more than the 250 ml Enlive and Ensure. I am not sure his insurance will cover, but we can try to order one of these.  They are both in EPIC

## 2016-08-15 NOTE — Telephone Encounter (Signed)
I sent it over to [facility]. He is uninsured, so I'm unsure of the cost.

## 2016-08-20 DIAGNOSIS — L89314 Pressure ulcer of right buttock, stage 4: Secondary | ICD-10-CM | POA: Diagnosis not present

## 2016-08-27 ENCOUNTER — Telehealth: Payer: Self-pay

## 2016-08-27 NOTE — Telephone Encounter (Signed)
Requesting pain meds [facility].

## 2016-08-28 ENCOUNTER — Other Ambulatory Visit: Payer: Self-pay | Admitting: *Deleted

## 2016-08-28 NOTE — Telephone Encounter (Signed)
Wants pain med refilled

## 2016-08-28 NOTE — Telephone Encounter (Signed)
Please call pt back regarding pain med.  

## 2016-08-29 NOTE — Telephone Encounter (Signed)
Very reasonable to expect a repeat assessment before continuing this medication.

## 2016-08-29 NOTE — Telephone Encounter (Signed)
Unfortunately, I had advised him at his last office visit that this medication was a short-term one. He is scheduled to see me in early February.  If his back pain is no better after six weeks, I wonder if he is having worsening wounds. Myriam Jacobson, can we call to see if he has had any constitutional symptoms, like fever, chills, nausea, vomiting, worsening back pain?  If so, I'd recommend he be seen. If not, I don't think the new [facility] policy on controlled substances allows me to continue filling short-term courses without a contract in place.   Dr. Oswaldo Done, do you agree?

## 2016-08-31 NOTE — Telephone Encounter (Signed)
Called and made appt for pt in [facility]

## 2016-09-03 ENCOUNTER — Encounter (HOSPITAL_BASED_OUTPATIENT_CLINIC_OR_DEPARTMENT_OTHER): Payer: Medicaid Other | Attending: Internal Medicine

## 2016-09-03 DIAGNOSIS — L89314 Pressure ulcer of right buttock, stage 4: Secondary | ICD-10-CM | POA: Diagnosis not present

## 2016-09-03 DIAGNOSIS — I10 Essential (primary) hypertension: Secondary | ICD-10-CM | POA: Diagnosis not present

## 2016-09-03 DIAGNOSIS — L89323 Pressure ulcer of left buttock, stage 3: Secondary | ICD-10-CM | POA: Insufficient documentation

## 2016-09-03 DIAGNOSIS — G8220 Paraplegia, unspecified: Secondary | ICD-10-CM | POA: Diagnosis not present

## 2016-09-05 ENCOUNTER — Encounter (INDEPENDENT_AMBULATORY_CARE_PROVIDER_SITE_OTHER): Payer: Self-pay

## 2016-09-05 ENCOUNTER — Ambulatory Visit (INDEPENDENT_AMBULATORY_CARE_PROVIDER_SITE_OTHER): Payer: Medicaid Other | Admitting: Internal Medicine

## 2016-09-05 VITALS — BP 143/80 | HR 94 | Temp 98.2°F

## 2016-09-05 DIAGNOSIS — Z993 Dependence on wheelchair: Secondary | ICD-10-CM

## 2016-09-05 DIAGNOSIS — G8929 Other chronic pain: Secondary | ICD-10-CM | POA: Diagnosis present

## 2016-09-05 DIAGNOSIS — R109 Unspecified abdominal pain: Secondary | ICD-10-CM | POA: Diagnosis not present

## 2016-09-05 DIAGNOSIS — L89319 Pressure ulcer of right buttock, unspecified stage: Secondary | ICD-10-CM | POA: Diagnosis not present

## 2016-09-05 DIAGNOSIS — B9689 Other specified bacterial agents as the cause of diseases classified elsewhere: Secondary | ICD-10-CM | POA: Diagnosis not present

## 2016-09-05 DIAGNOSIS — G8220 Paraplegia, unspecified: Secondary | ICD-10-CM | POA: Diagnosis not present

## 2016-09-05 DIAGNOSIS — Z978 Presence of other specified devices: Secondary | ICD-10-CM

## 2016-09-05 DIAGNOSIS — M8669 Other chronic osteomyelitis, multiple sites: Secondary | ICD-10-CM | POA: Diagnosis not present

## 2016-09-05 MED ORDER — TRAMADOL HCL 50 MG PO TABS: 50.0000 mg | ORAL_TABLET | Freq: Every evening | ORAL | 0 refills | Status: DC | PRN
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
## 2016-09-05 NOTE — Patient Instructions (Addendum)
It was a pleasure to meet you Cameron Zavala.  I am refilling your pain medicine, Tramadol 50 mg to take at bedtime only as needed, 15 tablets.  Please continue to follow up with wound care.  If you begin to have fevers, chills, or worsening pain, please let us know.
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
## 2016-09-06 NOTE — Progress Notes (Signed)
Internal Medicine Clinic Attending  Case discussed with Dr. Patel,Vishal at the time of the visit.  We reviewed the resident's history and exam and pertinent patient test results.  I agree with the assessment, diagnosis, and plan of care documented in the resident's note.  

## 2016-09-14 DIAGNOSIS — L89314 Pressure ulcer of right buttock, stage 4: Secondary | ICD-10-CM | POA: Diagnosis not present

## 2016-09-23 ENCOUNTER — Encounter: Payer: Self-pay | Admitting: Internal Medicine

## 2016-09-24 DIAGNOSIS — G8220 Paraplegia, unspecified: Secondary | ICD-10-CM | POA: Diagnosis not present

## 2016-09-25 ENCOUNTER — Ambulatory Visit (HOSPITAL_COMMUNITY)
Admission: RE | Admit: 2016-09-25 | Discharge: 2016-09-25 | Disposition: A | Discharge status: Home / Self Care | Payer: Medicaid Other | Source: Ambulatory Visit | Attending: Internal Medicine | Admitting: Internal Medicine

## 2016-09-25 ENCOUNTER — Other Ambulatory Visit (HOSPITAL_BASED_OUTPATIENT_CLINIC_OR_DEPARTMENT_OTHER): Payer: Self-pay

## 2016-09-25 DIAGNOSIS — M8958 Osteolysis, other site: Secondary | ICD-10-CM | POA: Diagnosis not present

## 2016-09-25 DIAGNOSIS — M89551 Osteolysis, right thigh: Secondary | ICD-10-CM | POA: Diagnosis not present

## 2016-09-25 DIAGNOSIS — M869 Osteomyelitis, unspecified: Secondary | ICD-10-CM

## 2016-09-25 DIAGNOSIS — M868X5 Other osteomyelitis, thigh: Secondary | ICD-10-CM | POA: Diagnosis present

## 2016-09-28 ENCOUNTER — Telehealth: Payer: Self-pay | Admitting: Internal Medicine

## 2016-09-28 NOTE — Telephone Encounter (Signed)
Agreed. Thank you for letting me know.

## 2016-09-28 NOTE — Telephone Encounter (Signed)
[facility] requesting a VO to continue wound care.

## 2016-09-28 NOTE — Telephone Encounter (Signed)
Called traci back, gave VO for continued HHN visits, do you agree?

## 2016-10-01 ENCOUNTER — Other Ambulatory Visit: Payer: Self-pay | Admitting: *Deleted

## 2016-10-01 NOTE — Telephone Encounter (Signed)
[facility]on [facility] stated patient is having foul smelling urine, almost like a pus consistency. Asking if she could do a UA. Verbal order given to do the test & will call us for the results. Patient has appt w/ pcp 10/26/16. Thanks!

## 2016-10-04 NOTE — Telephone Encounter (Signed)
I apologize for the delay in getting back to you. I would go ahead and check a urinalysis only if he is having symptoms, like nausea, vomiting, abdominal pain, flank pain, fever/chills. I expect it will show bacteria because of his chronic indwelling catheter. She could also try replacing it first to see what happens next.

## 2016-10-05 ENCOUNTER — Encounter (HOSPITAL_BASED_OUTPATIENT_CLINIC_OR_DEPARTMENT_OTHER): Payer: Medicaid Other | Attending: Internal Medicine

## 2016-10-05 DIAGNOSIS — M869 Osteomyelitis, unspecified: Secondary | ICD-10-CM | POA: Diagnosis not present

## 2016-10-05 DIAGNOSIS — G8220 Paraplegia, unspecified: Secondary | ICD-10-CM | POA: Insufficient documentation

## 2016-10-05 DIAGNOSIS — L89314 Pressure ulcer of right buttock, stage 4: Secondary | ICD-10-CM | POA: Insufficient documentation

## 2016-10-05 DIAGNOSIS — G8221 Paraplegia, complete: Secondary | ICD-10-CM | POA: Diagnosis not present

## 2016-10-05 DIAGNOSIS — L89323 Pressure ulcer of left buttock, stage 3: Secondary | ICD-10-CM | POA: Diagnosis not present

## 2016-10-05 DIAGNOSIS — I10 Essential (primary) hypertension: Secondary | ICD-10-CM | POA: Insufficient documentation

## 2016-10-09 ENCOUNTER — Other Ambulatory Visit: Payer: Self-pay | Admitting: Internal Medicine

## 2016-10-09 DIAGNOSIS — G8929 Other chronic pain: Secondary | ICD-10-CM

## 2016-10-09 DIAGNOSIS — R109 Unspecified abdominal pain: Principal | ICD-10-CM

## 2016-10-09 NOTE — Telephone Encounter (Signed)
Pt requesting Refill   traMADol (ULTRAM) 50 MG tablet

## 2016-10-09 NOTE — Telephone Encounter (Signed)
Order was given.

## 2016-10-12 MED ORDER — TRAMADOL HCL 50 MG PO TABS: 50.0000 mg | ORAL_TABLET | Freq: Every evening | ORAL | 0 refills | Status: DC | PRN
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
## 2016-10-12 NOTE — Addendum Note (Signed)
Addended by: Beather Arbour on: 10/12/2016 04:34 PM   Modules accepted: Orders

## 2016-10-12 NOTE — Telephone Encounter (Signed)
Last rx written was on 09/05/16. Last OV 09/05/16. Next appt 10/26/16.

## 2016-10-12 NOTE — Telephone Encounter (Signed)
Talked to pt's wife - stated Tramadol rx was refilled in December at [facility], since they delivers. Also stated he needs more than 15 tablet since his wound doctor has him on bedrest so his wds can heal; unable to move around. Thanks

## 2016-10-12 NOTE — Telephone Encounter (Signed)
In reviewing the [facility] database, he has not refilled this medication since 07/24/16. Can we see what happened to his prescription from 09/05/16? I am happy to address again at follow-up on 2/2.

## 2016-10-12 NOTE — Telephone Encounter (Signed)
Tramadol rx called to [facility] - pt's wife called/informed.

## 2016-10-19 DIAGNOSIS — L89314 Pressure ulcer of right buttock, stage 4: Secondary | ICD-10-CM | POA: Diagnosis not present

## 2016-10-25 DIAGNOSIS — L89313 Pressure ulcer of right buttock, stage 3: Secondary | ICD-10-CM | POA: Diagnosis not present

## 2016-10-26 ENCOUNTER — Ambulatory Visit (INDEPENDENT_AMBULATORY_CARE_PROVIDER_SITE_OTHER): Payer: Medicaid Other | Admitting: Internal Medicine

## 2016-10-26 ENCOUNTER — Encounter: Payer: Self-pay | Admitting: Internal Medicine

## 2016-10-26 VITALS — BP 143/74 | HR 94 | Temp 98.1°F | Ht 64.0 in

## 2016-10-26 DIAGNOSIS — E559 Vitamin D deficiency, unspecified: Secondary | ICD-10-CM

## 2016-10-26 DIAGNOSIS — D649 Anemia, unspecified: Secondary | ICD-10-CM

## 2016-10-26 DIAGNOSIS — G8220 Paraplegia, unspecified: Secondary | ICD-10-CM

## 2016-10-26 DIAGNOSIS — G8929 Other chronic pain: Secondary | ICD-10-CM | POA: Diagnosis not present

## 2016-10-26 DIAGNOSIS — L8990 Pressure ulcer of unspecified site, unspecified stage: Secondary | ICD-10-CM

## 2016-10-26 DIAGNOSIS — R109 Unspecified abdominal pain: Secondary | ICD-10-CM | POA: Diagnosis not present

## 2016-10-26 DIAGNOSIS — L8940 Pressure ulcer of contiguous site of back, buttock and hip, unspecified stage: Secondary | ICD-10-CM

## 2016-10-26 DIAGNOSIS — Z933 Colostomy status: Secondary | ICD-10-CM

## 2016-10-26 MED ORDER — TRAMADOL HCL 50 MG PO TABS
50.0000 mg | ORAL_TABLET | Freq: Every evening | ORAL | 2 refills | Status: DC | PRN
Start: 2016-10-26 — End: 2016-10-26

## 2016-10-26 MED ORDER — TRAMADOL HCL 50 MG PO TABS: 50.0000 mg | ORAL_TABLET | Freq: Every evening | ORAL | 2 refills | Status: DC | PRN
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
## 2016-10-26 NOTE — Patient Instructions (Signed)
We will call in the tramadol prescription. Please continue to take Ensure to help with wound healing.  Let's see each other back in May.

## 2016-10-28 ENCOUNTER — Encounter: Payer: Self-pay | Admitting: Internal Medicine

## 2016-10-28 NOTE — Assessment & Plan Note (Signed)
ADDENDUM 10/28/2016  5:53 PM:  Unclear if his blood counts have changed since last checked. I will also pend CBC and iron studies to assess for iron deficiency vs anemia of chronic disease.

## 2016-10-28 NOTE — Assessment & Plan Note (Signed)
Assessment Since his last office visit with me, he was placed on strict bed rest by his wound care physician. As such, he required more tramadol to help him sleep through the night.   On 09/05/16, he was prescribed tramadol 50 mg at bedtime as needed x 15 tablets which lasted him six weeks.  On 10/09/16, I authorized tramadol 50 mg at bedtime as needed x 20 tablets to last him until our appointment today, of which he has used about 10 tablets.  In reviewing the [facility] database, he has not refilled this medication since 07/24/16, and it appears his home delivery pharmacy does not log entries online.   He denies any worsening back pain, chills/fever, poor appetite which would be concerning for re-infection. His wife notes drainage from the wounds though it is not purulent. She also denies any decrease in colostomy output which could be worrisome for constipation.  Plan -Prescribe a 47-month supply of 20 tablets to last him until he can see me back in May. He has used 50% of his prescription over an interval of 2 weeks.  -Reiterated to him and his wife that we do not anticipate the tramadol to be a long-term solution as he is expected to get better and come off bedrest to which they both agreed.

## 2016-10-28 NOTE — Assessment & Plan Note (Signed)
Assessment Since his last office visit with me, he was placed on strict bed rest by his wound care physician. As such, he required more tramadol to help him sleep through the night.   On 09/05/16, he was prescribed tramadol 50 mg at bedtime as needed x 15 tablets which lasted him six weeks.  On 10/09/16, I authorized tramadol 50 mg at bedtime as needed x 20 tablets to last him until our appointment today, of which he has used about 10 tablets.  In reviewing the [facility] database, he has not refilled this medication since 07/24/16, and it appears his home delivery pharmacy does not log entries online.   He denies any worsening back pain, chills/fever, poor appetite which would be concerning for re-infection. His wife notes drainage from the wounds though it is not purulent. She also denies any decrease in colostomy output which could be worrisome for constipation.  Plan -Prescribe a 3-month supply of 20 tablets to last him until he can see me back in May. He has used 50% of his prescription over an interval of 2 weeks.  -Reiterated to him and his wife that we do not anticipate the tramadol to be a long-term solution as he is expected to get better and come off bedrest to which they both agreed. 

## 2016-10-28 NOTE — Assessment & Plan Note (Signed)
Assessment His DEXA scan in 2012 was without findings of osteopenia or osteoporosis though paraplegia is a risk factor. Unclear if his prior osteomyelitis will affect a repeat DEXA in the future.   Vitamin D resulted low at 16.9 back in January 2017, and he reports ongoing supplementation with cholecalciferol 1000 units daily.  Plan -Review test interpretation with Radiology before proceeding.  -Continue daily supplementation. Unfortunately, I forgot to repeat lab value and will see if we can draw the blood labs from [facility] RN.

## 2016-10-30 NOTE — Progress Notes (Signed)
Internal Medicine Clinic Attending  Case discussed with Dr. Patel,Rushil at the time of the visit.  We reviewed the resident's history and exam and pertinent patient test results.  I agree with the assessment, diagnosis, and plan of care documented in the resident's note.  

## 2016-10-31 ENCOUNTER — Telehealth: Payer: Self-pay

## 2016-10-31 NOTE — Telephone Encounter (Signed)
Tracy from [facility] requesting VO. Please call back.  

## 2016-10-31 NOTE — Telephone Encounter (Signed)
I agree

## 2016-10-31 NOTE — Telephone Encounter (Signed)
HHN ask for csw HH for services available because medicaid only offers 75 visits for [facility] a year, he will finish in march or April and they cannot see him again until July, she is worried because wound care is too complicated for family, VO given, do you agree?

## 2016-11-02 ENCOUNTER — Encounter (HOSPITAL_BASED_OUTPATIENT_CLINIC_OR_DEPARTMENT_OTHER): Payer: Medicaid Other | Attending: Internal Medicine

## 2016-11-02 DIAGNOSIS — L89323 Pressure ulcer of left buttock, stage 3: Secondary | ICD-10-CM | POA: Diagnosis not present

## 2016-11-02 DIAGNOSIS — L89314 Pressure ulcer of right buttock, stage 4: Secondary | ICD-10-CM | POA: Diagnosis not present

## 2016-11-02 DIAGNOSIS — M869 Osteomyelitis, unspecified: Secondary | ICD-10-CM | POA: Insufficient documentation

## 2016-11-02 DIAGNOSIS — Z933 Colostomy status: Secondary | ICD-10-CM | POA: Diagnosis not present

## 2016-11-02 DIAGNOSIS — G8220 Paraplegia, unspecified: Secondary | ICD-10-CM | POA: Diagnosis not present

## 2016-11-02 DIAGNOSIS — I10 Essential (primary) hypertension: Secondary | ICD-10-CM | POA: Insufficient documentation

## 2016-11-05 NOTE — Progress Notes (Signed)
These will be done 2/14

## 2016-11-06 ENCOUNTER — Ambulatory Visit (HOSPITAL_COMMUNITY)
Admission: RE | Admit: 2016-11-06 | Discharge: 2016-11-06 | Disposition: A | Discharge status: Home / Self Care | Payer: Medicaid Other | Source: Ambulatory Visit | Attending: Internal Medicine | Admitting: Internal Medicine

## 2016-11-06 ENCOUNTER — Other Ambulatory Visit: Payer: Self-pay | Admitting: Internal Medicine

## 2016-11-06 DIAGNOSIS — L89323 Pressure ulcer of left buttock, stage 3: Secondary | ICD-10-CM | POA: Insufficient documentation

## 2016-11-06 DIAGNOSIS — M869 Osteomyelitis, unspecified: Secondary | ICD-10-CM | POA: Diagnosis not present

## 2016-11-06 DIAGNOSIS — M898X5 Other specified disorders of bone, thigh: Secondary | ICD-10-CM | POA: Diagnosis not present

## 2016-11-16 DIAGNOSIS — L89314 Pressure ulcer of right buttock, stage 4: Secondary | ICD-10-CM | POA: Diagnosis not present

## 2016-11-22 DIAGNOSIS — L89313 Pressure ulcer of right buttock, stage 3: Secondary | ICD-10-CM | POA: Diagnosis not present

## 2016-11-27 ENCOUNTER — Encounter: Payer: Self-pay | Admitting: Internal Medicine

## 2016-11-27 DIAGNOSIS — Z79891 Long term (current) use of opiate analgesic: Secondary | ICD-10-CM | POA: Insufficient documentation

## 2016-11-28 ENCOUNTER — Telehealth: Payer: Self-pay

## 2016-11-28 NOTE — Telephone Encounter (Signed)
VO given for recert Do you approve Also pt will be going to skilled care facility for continued wound care, asking about FL2, will send to [facility] for this

## 2016-11-28 NOTE — Telephone Encounter (Signed)
Tracy from [facility] requesting VO. Please call back.  

## 2016-11-28 NOTE — Telephone Encounter (Signed)
Yes, I approve

## 2016-11-30 ENCOUNTER — Encounter (HOSPITAL_BASED_OUTPATIENT_CLINIC_OR_DEPARTMENT_OTHER): Payer: Medicaid Other | Attending: Internal Medicine

## 2016-11-30 DIAGNOSIS — G8221 Paraplegia, complete: Secondary | ICD-10-CM | POA: Diagnosis not present

## 2016-11-30 DIAGNOSIS — L89323 Pressure ulcer of left buttock, stage 3: Secondary | ICD-10-CM | POA: Insufficient documentation

## 2016-11-30 DIAGNOSIS — L89314 Pressure ulcer of right buttock, stage 4: Secondary | ICD-10-CM | POA: Insufficient documentation

## 2016-11-30 DIAGNOSIS — I10 Essential (primary) hypertension: Secondary | ICD-10-CM | POA: Diagnosis not present

## 2016-12-07 DIAGNOSIS — L89314 Pressure ulcer of right buttock, stage 4: Secondary | ICD-10-CM | POA: Diagnosis not present

## 2016-12-14 DIAGNOSIS — L89314 Pressure ulcer of right buttock, stage 4: Secondary | ICD-10-CM | POA: Diagnosis not present

## 2016-12-18 LAB — HEPATIC FUNCTION PANEL
ALK PHOS: 115 U/L (ref 25–125)
ALT: 24 U/L (ref 10–40)
AST: 23 U/L (ref 14–40)
BILIRUBIN, TOTAL: 0.3 mg/dL

## 2016-12-18 LAB — CBC AND DIFFERENTIAL
HEMATOCRIT: 44 % (ref 41–53)
HEMOGLOBIN: 13.5 g/dL (ref 13.5–17.5)
NEUTROS ABS: 6 /uL
PLATELETS: 268 10*3/uL (ref 150–399)
WBC: 8.2 10*3/mL

## 2016-12-18 LAB — BASIC METABOLIC PANEL
BUN: 18 mg/dL (ref 4–21)
Creatinine: 0.6 mg/dL (ref 0.6–1.3)
Glucose: 101 mg/dL
Potassium: 4 mmol/L (ref 3.4–5.3)
Sodium: 135 mmol/L — AB (ref 137–147)

## 2016-12-19 ENCOUNTER — Non-Acute Institutional Stay (SKILLED_NURSING_FACILITY): Payer: Medicaid Other | Admitting: Internal Medicine

## 2016-12-19 ENCOUNTER — Encounter: Payer: Self-pay | Admitting: Internal Medicine

## 2016-12-19 DIAGNOSIS — L8940 Pressure ulcer of contiguous site of back, buttock and hip, unspecified stage: Secondary | ICD-10-CM | POA: Diagnosis not present

## 2016-12-19 DIAGNOSIS — I10 Essential (primary) hypertension: Secondary | ICD-10-CM

## 2016-12-19 NOTE — Assessment & Plan Note (Signed)
BP controlled; no change in antihypertensive medications  
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
## 2016-12-19 NOTE — Assessment & Plan Note (Signed)
Intensive wound care at the SNF

## 2016-12-19 NOTE — Patient Instructions (Signed)
See assessment and plan under each diagnosis in the problem list and acutely for this visit 

## 2017-01-14 ENCOUNTER — Encounter: Payer: Self-pay | Admitting: Adult Health

## 2017-01-14 ENCOUNTER — Non-Acute Institutional Stay (SKILLED_NURSING_FACILITY): Payer: Medicaid Other | Admitting: Adult Health

## 2017-01-14 DIAGNOSIS — I10 Essential (primary) hypertension: Secondary | ICD-10-CM | POA: Diagnosis not present

## 2017-01-14 DIAGNOSIS — G8220 Paraplegia, unspecified: Secondary | ICD-10-CM

## 2017-01-14 DIAGNOSIS — G8929 Other chronic pain: Secondary | ICD-10-CM | POA: Diagnosis not present

## 2017-01-14 DIAGNOSIS — N39498 Other specified urinary incontinence: Secondary | ICD-10-CM | POA: Diagnosis not present

## 2017-01-14 DIAGNOSIS — R109 Unspecified abdominal pain: Secondary | ICD-10-CM

## 2017-01-14 DIAGNOSIS — L89314 Pressure ulcer of right buttock, stage 4: Secondary | ICD-10-CM | POA: Insufficient documentation

## 2017-01-14 DIAGNOSIS — K5901 Slow transit constipation: Secondary | ICD-10-CM
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
## 2017-01-17 ENCOUNTER — Inpatient Hospital Stay (HOSPITAL_COMMUNITY)
Admission: EM | Admit: 2017-01-17 | Discharge: 2017-01-23 | DRG: 698 | Disposition: A | Discharge status: Skilled Nursing Facility | Payer: Medicaid Other | Attending: Internal Medicine | Admitting: Internal Medicine

## 2017-01-17 ENCOUNTER — Encounter (HOSPITAL_COMMUNITY): Payer: Self-pay

## 2017-01-17 ENCOUNTER — Emergency Department (HOSPITAL_COMMUNITY): Payer: Medicaid Other

## 2017-01-17 DIAGNOSIS — I10 Essential (primary) hypertension: Secondary | ICD-10-CM | POA: Diagnosis present

## 2017-01-17 DIAGNOSIS — B961 Klebsiella pneumoniae [K. pneumoniae] as the cause of diseases classified elsewhere: Secondary | ICD-10-CM | POA: Diagnosis not present

## 2017-01-17 DIAGNOSIS — Z91018 Allergy to other foods: Secondary | ICD-10-CM

## 2017-01-17 DIAGNOSIS — N390 Urinary tract infection, site not specified: Secondary | ICD-10-CM | POA: Diagnosis not present

## 2017-01-17 DIAGNOSIS — T83511A Infection and inflammatory reaction due to indwelling urethral catheter, initial encounter: Secondary | ICD-10-CM

## 2017-01-17 DIAGNOSIS — L8990 Pressure ulcer of unspecified site, unspecified stage: Secondary | ICD-10-CM | POA: Diagnosis present

## 2017-01-17 DIAGNOSIS — Z820 Family history of epilepsy and other diseases of the nervous system: Secondary | ICD-10-CM

## 2017-01-17 DIAGNOSIS — A419 Sepsis, unspecified organism: Secondary | ICD-10-CM | POA: Diagnosis not present

## 2017-01-17 DIAGNOSIS — K219 Gastro-esophageal reflux disease without esophagitis: Secondary | ICD-10-CM | POA: Diagnosis present

## 2017-01-17 DIAGNOSIS — G8220 Paraplegia, unspecified: Secondary | ICD-10-CM | POA: Diagnosis present

## 2017-01-17 DIAGNOSIS — Z91010 Allergy to peanuts: Secondary | ICD-10-CM | POA: Diagnosis not present

## 2017-01-17 DIAGNOSIS — Z87891 Personal history of nicotine dependence: Secondary | ICD-10-CM

## 2017-01-17 DIAGNOSIS — Z888 Allergy status to other drugs, medicaments and biological substances status: Secondary | ICD-10-CM

## 2017-01-17 DIAGNOSIS — W3400XS Accidental discharge from unspecified firearms or gun, sequela: Secondary | ICD-10-CM

## 2017-01-17 DIAGNOSIS — E559 Vitamin D deficiency, unspecified: Secondary | ICD-10-CM | POA: Diagnosis present

## 2017-01-17 DIAGNOSIS — L89323 Pressure ulcer of left buttock, stage 3: Secondary | ICD-10-CM | POA: Diagnosis present

## 2017-01-17 DIAGNOSIS — L89154 Pressure ulcer of sacral region, stage 4: Secondary | ICD-10-CM | POA: Diagnosis not present

## 2017-01-17 DIAGNOSIS — L89324 Pressure ulcer of left buttock, stage 4: Secondary | ICD-10-CM | POA: Diagnosis present

## 2017-01-17 DIAGNOSIS — Z933 Colostomy status: Secondary | ICD-10-CM | POA: Diagnosis not present

## 2017-01-17 DIAGNOSIS — Z882 Allergy status to sulfonamides status: Secondary | ICD-10-CM | POA: Diagnosis not present

## 2017-01-17 DIAGNOSIS — L89314 Pressure ulcer of right buttock, stage 4: Secondary | ICD-10-CM | POA: Diagnosis present

## 2017-01-17 DIAGNOSIS — Z960 Presence of urogenital implants: Secondary | ICD-10-CM | POA: Diagnosis not present

## 2017-01-17 DIAGNOSIS — N39498 Other specified urinary incontinence: Secondary | ICD-10-CM | POA: Diagnosis present

## 2017-01-17 DIAGNOSIS — B965 Pseudomonas (aeruginosa) (mallei) (pseudomallei) as the cause of diseases classified elsewhere: Secondary | ICD-10-CM | POA: Diagnosis present

## 2017-01-17 DIAGNOSIS — Z91048 Other nonmedicinal substance allergy status: Secondary | ICD-10-CM

## 2017-01-17 DIAGNOSIS — E785 Hyperlipidemia, unspecified: Secondary | ICD-10-CM | POA: Diagnosis present

## 2017-01-17 DIAGNOSIS — A414 Sepsis due to anaerobes: Secondary | ICD-10-CM | POA: Diagnosis present

## 2017-01-17 DIAGNOSIS — R6510 Systemic inflammatory response syndrome (SIRS) of non-infectious origin without acute organ dysfunction: Secondary | ICD-10-CM

## 2017-01-17 DIAGNOSIS — T83518A Infection and inflammatory reaction due to other urinary catheter, initial encounter: Principal | ICD-10-CM | POA: Diagnosis present

## 2017-01-17 DIAGNOSIS — Z881 Allergy status to other antibiotic agents status: Secondary | ICD-10-CM

## 2017-01-17 DIAGNOSIS — Z9102 Food additives allergy status: Secondary | ICD-10-CM | POA: Diagnosis not present

## 2017-01-17 DIAGNOSIS — A4159 Other Gram-negative sepsis: Secondary | ICD-10-CM | POA: Diagnosis present

## 2017-01-17 LAB — COMPREHENSIVE METABOLIC PANEL
ALBUMIN: 3.7 g/dL (ref 3.5–5.0)
ALT: 15 U/L — ABNORMAL LOW (ref 17–63)
ANION GAP: 7 (ref 5–15)
AST: 24 U/L (ref 15–41)
Alkaline Phosphatase: 117 U/L (ref 38–126)
BILIRUBIN TOTAL: 0.4 mg/dL (ref 0.3–1.2)
BUN: 15 mg/dL (ref 6–20)
CO2: 26 mmol/L (ref 22–32)
Calcium: 8.8 mg/dL — ABNORMAL LOW (ref 8.9–10.3)
Chloride: 102 mmol/L (ref 101–111)
Creatinine, Ser: 0.85 mg/dL (ref 0.61–1.24)
GFR calc non Af Amer: >60 mL/min (ref >60)
GLUCOSE: 111 mg/dL — AB (ref 65–99)
POTASSIUM: 4.3 mmol/L (ref 3.5–5.1)
SODIUM: 135 mmol/L (ref 135–145)
Total Protein: 7.8 g/dL (ref 6.5–8.1)

## 2017-01-17 LAB — URINALYSIS, ROUTINE W REFLEX MICROSCOPIC
Bilirubin Urine: NEGATIVE
GLUCOSE, UA: NEGATIVE mg/dL
Ketones, ur: NEGATIVE mg/dL
NITRITE: POSITIVE — AB
PROTEIN: 30 mg/dL — AB
Specific Gravity, Urine: 1.017 (ref 1.005–1.030)
Squamous Epithelial / LPF: NONE SEEN
pH: 6 (ref 5.0–8.0)

## 2017-01-17 LAB — CBC AND DIFFERENTIAL
HEMATOCRIT: 39 % — AB (ref 41–53)
Hemoglobin: 12.6 g/dL — AB (ref 13.5–17.5)
Platelets: 266 10*3/uL (ref 150–399)
WBC: 11.3 10*3/mL

## 2017-01-17 LAB — BASIC METABOLIC PANEL
BUN: 15 mg/dL (ref 4–21)
Creatinine: 0.9 mg/dL (ref 0.6–1.3)
GLUCOSE: 111 mg/dL
POTASSIUM: 4.3 mmol/L (ref 3.4–5.3)
SODIUM: 135 mmol/L — AB (ref 137–147)

## 2017-01-17 LAB — CBC WITH DIFFERENTIAL/PLATELET
BASOS ABS: 0 10*3/uL (ref 0.0–0.1)
Basophils Relative: 0 %
Eosinophils Absolute: 0.1 10*3/uL (ref 0.0–0.7)
Eosinophils Relative: 1 %
HEMATOCRIT: 38.8 % — AB (ref 39.0–52.0)
Hemoglobin: 12.6 g/dL — ABNORMAL LOW (ref 13.0–17.0)
LYMPHS PCT: 11 %
Lymphs Abs: 1.3 10*3/uL (ref 0.7–4.0)
MCH: 26.1 pg (ref 26.0–34.0)
MCHC: 32.5 g/dL (ref 30.0–36.0)
MCV: 80.3 fL (ref 78.0–100.0)
Monocytes Absolute: 0.4 10*3/uL (ref 0.1–1.0)
Monocytes Relative: 3 %
NEUTROS ABS: 9.6 10*3/uL — AB (ref 1.7–7.7)
NEUTROS PCT: 85 %
PLATELETS: 266 10*3/uL (ref 150–400)
RBC: 4.83 MIL/uL (ref 4.22–5.81)
RDW: 16.4 % — ABNORMAL HIGH (ref 11.5–15.5)
WBC: 11.3 10*3/uL — AB (ref 4.0–10.5)

## 2017-01-17 LAB — HEPATIC FUNCTION PANEL
ALT: 15 U/L (ref 10–40)
AST: 24 U/L (ref 14–40)
Alkaline Phosphatase: 117 U/L (ref 25–125)
BILIRUBIN, TOTAL: 0.4 mg/dL

## 2017-01-17 LAB — LACTIC ACID, PLASMA
Lactic Acid, Venous: 1.5 mmol/L (ref 0.5–1.9)
Lactic Acid, Venous: 1.2 mmol/L (ref 0.5–1.9)

## 2017-01-17 LAB — I-STAT CG4 LACTIC ACID, ED
Lactic Acid, Venous: 2.57 mmol/L (ref 0.5–1.9)
Lactic Acid, Venous: 2.61 mmol/L (ref 0.5–1.9)

## 2017-01-17 LAB — PROTIME-INR
INR: 1.13
Prothrombin Time: 14.5 seconds (ref 11.4–15.2)

## 2017-01-17 LAB — MRSA PCR SCREENING: MRSA BY PCR: NEGATIVE

## 2017-01-17 MED ORDER — SODIUM CHLORIDE 0.45 % IV BOLUS: 1000.0000 mL | Freq: Once | INTRAVENOUS | Status: DC

## 2017-01-17 MED ORDER — SENNOSIDES-DOCUSATE SODIUM 8.6-50 MG PO TABS: 1.0000 | ORAL_TABLET | Freq: Every evening | ORAL | Status: DC | PRN

## 2017-01-17 MED ORDER — SODIUM CHLORIDE 0.9 % IV BOLUS (SEPSIS)
1000.0000 mL | Freq: Once | INTRAVENOUS | Status: AC
  Administered 2017-01-17: 1000 mL via INTRAVENOUS

## 2017-01-17 MED ORDER — DEXTROSE 5 % IV SOLN
1.0000 g | Freq: Once | INTRAVENOUS | Status: AC
  Administered 2017-01-17: 1 g via INTRAVENOUS
  Filled 2017-01-17: qty 10

## 2017-01-17 MED ORDER — HEPARIN SODIUM (PORCINE) 5000 UNIT/ML IJ SOLN
5000.0000 [IU] | Freq: Three times a day (TID) | INTRAMUSCULAR | Status: DC
  Administered 2017-01-17 – 2017-01-23 (×17): 5000 [IU] via SUBCUTANEOUS
  Filled 2017-01-17 (×17): qty 1

## 2017-01-17 MED ORDER — SODIUM CHLORIDE 0.9 % IV BOLUS (SEPSIS)
500.0000 mL | Freq: Once | INTRAVENOUS | Status: AC
  Administered 2017-01-17: 500 mL via INTRAVENOUS

## 2017-01-17 MED ORDER — IBUPROFEN 800 MG PO TABS: 800.0000 mg | ORAL_TABLET | Freq: Once | ORAL | Status: DC

## 2017-01-17 MED ORDER — SODIUM CHLORIDE 0.9 % IV SOLN
INTRAVENOUS | Status: AC
  Administered 2017-01-17: 18:00:00 via INTRAVENOUS
  Administered 2017-01-17: 1000 mL via INTRAVENOUS

## 2017-01-17 MED ORDER — DEXTROSE 5 % IV SOLN
1.0000 g | Freq: Three times a day (TID) | INTRAVENOUS | Status: DC
  Administered 2017-01-17 – 2017-01-18 (×2): 1 g via INTRAVENOUS
  Filled 2017-01-17 (×4): qty 1

## 2017-01-17 MED ORDER — ONDANSETRON HCL 4 MG/2ML IJ SOLN
4.0000 mg | Freq: Once | INTRAMUSCULAR | Status: AC
  Administered 2017-01-17: 4 mg via INTRAVENOUS
  Filled 2017-01-17: qty 2

## 2017-01-17 MED ORDER — CITALOPRAM HYDROBROMIDE 20 MG PO TABS
20.0000 mg | ORAL_TABLET | Freq: Every day | ORAL | Status: DC
  Filled 2017-01-17: qty 1

## 2017-01-17 MED ORDER — ACETAMINOPHEN 650 MG RE SUPP: 650.0000 mg | Freq: Four times a day (QID) | RECTAL | Status: DC | PRN

## 2017-01-17 MED ORDER — SODIUM CHLORIDE 0.9% FLUSH
3.0000 mL | Freq: Two times a day (BID) | INTRAVENOUS | Status: DC
  Administered 2017-01-17 – 2017-01-23 (×9): 3 mL via INTRAVENOUS

## 2017-01-17 MED ORDER — PROMETHAZINE HCL 25 MG PO TABS: 12.5000 mg | ORAL_TABLET | Freq: Four times a day (QID) | ORAL | Status: DC | PRN

## 2017-01-17 MED ORDER — KETOROLAC TROMETHAMINE 30 MG/ML IJ SOLN
15.0000 mg | Freq: Once | INTRAMUSCULAR | Status: AC
  Administered 2017-01-17: 15 mg via INTRAVENOUS
  Filled 2017-01-17: qty 1

## 2017-01-17 MED ORDER — ACETAMINOPHEN 325 MG PO TABS
650.0000 mg | ORAL_TABLET | Freq: Four times a day (QID) | ORAL | Status: DC | PRN
  Administered 2017-01-18 (×2): 650 mg via ORAL
  Filled 2017-01-17 (×2): qty 2

## 2017-01-17 NOTE — ED Notes (Signed)
Pt being taken upstairs by Jon Gills, RN

## 2017-01-17 NOTE — ED Notes (Signed)
Pt returned from X Ray.

## 2017-01-17 NOTE — Progress Notes (Signed)
Pharmacy Antibiotic Note  Cameron Zavala is a 41 y.o. male admitted on 01/17/2017 with UTI. PMHx significant for paraplegia, chronic decubitus ulcer, history of osteomyelitis, hypertension and with chronic indwelling foley catheter. Pharmacy has been consulted for cefepime dosing. Positive urinalysis  Plan: Cefepime 1G IV every 8 hours F/U LOT, c/s, clinical progression  Height: 5\' 4"  (162.6 cm) Weight: 135 lb (61.2 kg) IBW/kg (Calculated) : 59.2  Temp (24hrs), Avg:102.2 F (39 C), Min:100.3 F (37.9 C), Max:103.3 F (39.6 C)   Recent Labs Lab 01/17/17 1141 01/17/17 1213 01/17/17 1502 01/17/17 1814  WBC 11.3*  --   --   --   CREATININE 0.85  --   --   --   LATICACIDVEN  --  2.57* 2.61* 1.5    Estimated Creatinine Clearance: 96.7 mL/min (by C-G formula based on SCr of 0.85 mg/dL).    Allergies  Allergen Reactions  . Baclofen Itching  . Flexeril [Cyclobenzaprine] Other (See Comments)    Extreme tiredness for 24 hours  . Potassium Nausea And Vomiting  . Silver Itching  . Zinc Swelling  . Carbamazepine Hives and Rash  . Chocolate Itching and Rash  . Flour Itching and Rash    Toast, biscuits rolls   . Meat Extract Itching and Rash    Different types of steak  . Other Itching and Rash    Vanilla pudding,  cookies (wafers with white cream in the middle), microwave meals  . Peanut-Containing Drug Products Itching and Rash    Red lips  . Pumpkin Seed Itching and Rash  . Rosuvastatin Calcium Itching and Rash  . Statins Itching and Rash  . Sulfonamide Derivatives Itching and Rash  . Sunflower Seed [Sunflower Oil] Itching and Rash  . Tape Itching and Rash    Plastic tape.  Can only use paper tape.    Thank you for allowing pharmacy to be a part of this patient's care.  Toniann Fail Jamita Mckelvin 01/17/2017 7:10 PM
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
## 2017-01-17 NOTE — ED Notes (Signed)
Pt taken to Xray.
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
## 2017-01-17 NOTE — ED Notes (Signed)
Admitting Team at the bedside 

## 2017-01-17 NOTE — ED Triage Notes (Signed)
Per EMS, Pt is coming from [facility]. Staff came in this morning and noted blood in his urine bag. Foley Catheter was changed yesterday. Noted to have 102 Fever, but pt denies any pain.  EMS gave pt 1000 mg of Tylenol. Vitals per EMS: 180/70, 100 HR, 16 RR, 121 CBG

## 2017-01-17 NOTE — ED Notes (Signed)
MD Lockwood at the bedside  

## 2017-01-18 DIAGNOSIS — Z933 Colostomy status: Secondary | ICD-10-CM | POA: Diagnosis not present

## 2017-01-18 DIAGNOSIS — Z882 Allergy status to sulfonamides status: Secondary | ICD-10-CM | POA: Diagnosis not present

## 2017-01-18 DIAGNOSIS — R7881 Bacteremia: Secondary | ICD-10-CM

## 2017-01-18 DIAGNOSIS — Z598 Other problems related to housing and economic circumstances: Secondary | ICD-10-CM | POA: Diagnosis not present

## 2017-01-18 DIAGNOSIS — Z8739 Personal history of other diseases of the musculoskeletal system and connective tissue: Secondary | ICD-10-CM

## 2017-01-18 DIAGNOSIS — Z881 Allergy status to other antibiotic agents status: Secondary | ICD-10-CM | POA: Diagnosis not present

## 2017-01-18 DIAGNOSIS — Z87891 Personal history of nicotine dependence: Secondary | ICD-10-CM | POA: Diagnosis not present

## 2017-01-18 DIAGNOSIS — L89314 Pressure ulcer of right buttock, stage 4: Secondary | ICD-10-CM | POA: Diagnosis present

## 2017-01-18 DIAGNOSIS — Z960 Presence of urogenital implants: Secondary | ICD-10-CM

## 2017-01-18 DIAGNOSIS — Z820 Family history of epilepsy and other diseases of the nervous system: Secondary | ICD-10-CM | POA: Diagnosis not present

## 2017-01-18 DIAGNOSIS — E559 Vitamin D deficiency, unspecified: Secondary | ICD-10-CM | POA: Diagnosis present

## 2017-01-18 DIAGNOSIS — W3400XS Accidental discharge from unspecified firearms or gun, sequela: Secondary | ICD-10-CM | POA: Diagnosis not present

## 2017-01-18 DIAGNOSIS — R6510 Systemic inflammatory response syndrome (SIRS) of non-infectious origin without acute organ dysfunction: Secondary | ICD-10-CM | POA: Diagnosis present

## 2017-01-18 DIAGNOSIS — K219 Gastro-esophageal reflux disease without esophagitis: Secondary | ICD-10-CM | POA: Diagnosis present

## 2017-01-18 DIAGNOSIS — L8990 Pressure ulcer of unspecified site, unspecified stage: Secondary | ICD-10-CM | POA: Diagnosis present

## 2017-01-18 DIAGNOSIS — L89304 Pressure ulcer of unspecified buttock, stage 4: Secondary | ICD-10-CM | POA: Diagnosis not present

## 2017-01-18 DIAGNOSIS — A419 Sepsis, unspecified organism: Secondary | ICD-10-CM | POA: Diagnosis present

## 2017-01-18 DIAGNOSIS — L89324 Pressure ulcer of left buttock, stage 4: Secondary | ICD-10-CM | POA: Diagnosis not present

## 2017-01-18 DIAGNOSIS — I10 Essential (primary) hypertension: Secondary | ICD-10-CM | POA: Diagnosis present

## 2017-01-18 DIAGNOSIS — Z888 Allergy status to other drugs, medicaments and biological substances status: Secondary | ICD-10-CM | POA: Diagnosis not present

## 2017-01-18 DIAGNOSIS — L89154 Pressure ulcer of sacral region, stage 4: Secondary | ICD-10-CM | POA: Diagnosis not present

## 2017-01-18 DIAGNOSIS — N390 Urinary tract infection, site not specified: Secondary | ICD-10-CM

## 2017-01-18 DIAGNOSIS — B965 Pseudomonas (aeruginosa) (mallei) (pseudomallei) as the cause of diseases classified elsewhere: Secondary | ICD-10-CM | POA: Diagnosis not present

## 2017-01-18 DIAGNOSIS — Z91010 Allergy to peanuts: Secondary | ICD-10-CM | POA: Diagnosis not present

## 2017-01-18 DIAGNOSIS — Z91018 Allergy to other foods: Secondary | ICD-10-CM | POA: Diagnosis not present

## 2017-01-18 DIAGNOSIS — B961 Klebsiella pneumoniae [K. pneumoniae] as the cause of diseases classified elsewhere: Secondary | ICD-10-CM | POA: Diagnosis present

## 2017-01-18 DIAGNOSIS — N39498 Other specified urinary incontinence: Secondary | ICD-10-CM | POA: Diagnosis present

## 2017-01-18 DIAGNOSIS — E785 Hyperlipidemia, unspecified: Secondary | ICD-10-CM | POA: Diagnosis present

## 2017-01-18 DIAGNOSIS — L89323 Pressure ulcer of left buttock, stage 3: Secondary | ICD-10-CM | POA: Diagnosis present

## 2017-01-18 DIAGNOSIS — G8220 Paraplegia, unspecified: Secondary | ICD-10-CM | POA: Diagnosis present

## 2017-01-18 DIAGNOSIS — Z91048 Other nonmedicinal substance allergy status: Secondary | ICD-10-CM | POA: Diagnosis not present

## 2017-01-18 DIAGNOSIS — T83518A Infection and inflammatory reaction due to other urinary catheter, initial encounter: Secondary | ICD-10-CM | POA: Diagnosis not present

## 2017-01-18 LAB — COMPREHENSIVE METABOLIC PANEL
ALT: 15 U/L — ABNORMAL LOW (ref 17–63)
ANION GAP: 8 (ref 5–15)
AST: 28 U/L (ref 15–41)
Albumin: 2.9 g/dL — ABNORMAL LOW (ref 3.5–5.0)
Alkaline Phosphatase: 89 U/L (ref 38–126)
BUN: 18 mg/dL (ref 6–20)
CALCIUM: 7.9 mg/dL — AB (ref 8.9–10.3)
CHLORIDE: 108 mmol/L (ref 101–111)
CO2: 21 mmol/L — AB (ref 22–32)
CREATININE: 1.05 mg/dL (ref 0.61–1.24)
Glucose, Bld: 100 mg/dL — ABNORMAL HIGH (ref 65–99)
Potassium: 4.8 mmol/L (ref 3.5–5.1)
Sodium: 137 mmol/L (ref 135–145)
Total Bilirubin: 0.9 mg/dL (ref 0.3–1.2)
Total Protein: 6.3 g/dL — ABNORMAL LOW (ref 6.5–8.1)

## 2017-01-18 LAB — HEPATIC FUNCTION PANEL: BILIRUBIN, TOTAL: 0.4 mg/dL

## 2017-01-18 LAB — BLOOD CULTURE ID PANEL (REFLEXED)
Acinetobacter baumannii: NOT DETECTED
CANDIDA KRUSEI: NOT DETECTED
CANDIDA PARAPSILOSIS: NOT DETECTED
CARBAPENEM RESISTANCE: NOT DETECTED
Candida albicans: NOT DETECTED
Candida glabrata: NOT DETECTED
Candida tropicalis: NOT DETECTED
ENTEROBACTER CLOACAE COMPLEX: NOT DETECTED
ENTEROBACTERIACEAE SPECIES: DETECTED — AB
ENTEROCOCCUS SPECIES: NOT DETECTED
Escherichia coli: NOT DETECTED
Haemophilus influenzae: NOT DETECTED
KLEBSIELLA PNEUMONIAE: NOT DETECTED
Klebsiella oxytoca: DETECTED — AB
Listeria monocytogenes: NOT DETECTED
Neisseria meningitidis: NOT DETECTED
Proteus species: NOT DETECTED
Pseudomonas aeruginosa: NOT DETECTED
STAPHYLOCOCCUS AUREUS BCID: NOT DETECTED
Serratia marcescens: NOT DETECTED
Staphylococcus species: NOT DETECTED
Streptococcus agalactiae: NOT DETECTED
Streptococcus pneumoniae: NOT DETECTED
Streptococcus pyogenes: NOT DETECTED
Streptococcus species: NOT DETECTED

## 2017-01-18 LAB — BASIC METABOLIC PANEL
BUN: 15 mg/dL (ref 4–21)
CREATININE: 0.9 mg/dL (ref 0.6–1.3)
Glucose: 111 mg/dL
Potassium: 4.3 mmol/L (ref 3.4–5.3)
SODIUM: 135 mmol/L — AB (ref 137–147)

## 2017-01-18 LAB — CBC
HCT: 35.3 % — ABNORMAL LOW (ref 39.0–52.0)
Hemoglobin: 11.2 g/dL — ABNORMAL LOW (ref 13.0–17.0)
MCH: 25.6 pg — AB (ref 26.0–34.0)
MCHC: 31.7 g/dL (ref 30.0–36.0)
MCV: 80.6 fL (ref 78.0–100.0)
PLATELETS: 165 10*3/uL (ref 150–400)
RBC: 4.38 MIL/uL (ref 4.22–5.81)
RDW: 16.5 % — ABNORMAL HIGH (ref 11.5–15.5)
WBC: 18.3 10*3/uL — ABNORMAL HIGH (ref 4.0–10.5)

## 2017-01-18 LAB — GLUCOSE, CAPILLARY: Glucose-Capillary: 86 mg/dL (ref 65–99)

## 2017-01-18 LAB — CBC AND DIFFERENTIAL
HCT: 35 % — AB (ref 41–53)
Hemoglobin: 11.2 g/dL — AB (ref 13.5–17.5)
Platelets: 165 10*3/uL (ref 150–399)
WBC: 18.3 10^3/mL

## 2017-01-18 MED ORDER — ONDANSETRON HCL 4 MG/2ML IJ SOLN
4.0000 mg | Freq: Four times a day (QID) | INTRAMUSCULAR | Status: DC | PRN
  Administered 2017-01-18 – 2017-01-20 (×2): 4 mg via INTRAVENOUS
  Filled 2017-01-18 (×2): qty 2

## 2017-01-18 MED ORDER — DEXTROSE 5 % IV SOLN
1.0000 g | INTRAVENOUS | Status: DC
  Administered 2017-01-18: 1 g via INTRAVENOUS
  Filled 2017-01-18 (×2): qty 10

## 2017-01-18 MED ORDER — PNEUMOCOCCAL VAC POLYVALENT 25 MCG/0.5ML IJ INJ
0.5000 mL | INJECTION | INTRAMUSCULAR | Status: DC
  Filled 2017-01-18: qty 0.5

## 2017-01-18 NOTE — NC FL2 (Signed)
[place] MEDICAID FL2 LEVEL OF CARE SCREENING TOOL     IDENTIFICATION  Patient Name: Cameron Zavala Birthdate: 1975/10/25 Sex: male Admission Date (Current Location): 01/17/2017  [facility] and [place] Number:  Producer, television/film/video and Address:  [facility], [address] [place], [place] [address]      Provider Number: [id number]  Attending Physician Name and Address:  Gust Rung, DO  Relative Name and Phone Number:       Current Level of Care: Hospital Recommended Level of Care: Skilled Nursing Facility Prior Approval Number:    Date Approved/Denied: 01/18/17 PASRR Number: [id number]  Discharge Plan: SNF    Current Diagnoses: Patient Active Problem List   Diagnosis Date Noted  . Pressure injury of skin 01/18/2017  . UTI (urinary tract infection) 01/17/2017  . Decubitus ulcer of right ischium, stage IV (HCC) 01/14/2017  . Long term current use of opiate analgesic 11/27/2016  . Chronic indwelling Foley catheter 07/20/2016  . Bony growth 06/07/2016  . Osteomyelitis, pelvic region and thigh (HCC)   . Bacteremia 01/12/2016  . Anemia 01/12/2016  . 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 History of necrotizing fasciitis 01/11/2016  . Prediabetes 12/07/2015  . Hyperlipidemia 12/07/2015  . Chronic infective otitis externa of both ears 08/29/2014  . Preventative health care 07/29/2013  . Food allergy 01/06/2013  . Blurry vision, bilateral 11/05/2011  . Decubitus ulcer of left ischium, stage IV (HCC)   . Neurogenic urinary incontinence 04/09/2011  . Right flank pain, chronic 03/06/2011  . INSOMNIA 08/01/2009  . Vitamin D deficiency 06/30/2008  . Constipation 05/25/2008  . Essential hypertension 04/01/2007  . ALLERGIC RHINITIS 04/01/2007    Orientation RESPIRATION BLADDER Height & Weight     Self, Time, Situation, Place  Normal   Weight: 135 lb (61.2 kg) Height:  5\' 4"  (162.6 cm)  BEHAVIORAL SYMPTOMS/MOOD NEUROLOGICAL BOWEL  NUTRITION STATUS      Colostomy (S) Diet (See DC summary)  AMBULATORY STATUS COMMUNICATION OF NEEDS Skin   Total Care Verbally Other (Comment), PU Stage and Appropriate Care (Stage IV, full thickness tissue on buttocks exposed ; Stage III  subctaneous tissue loss, tendon not exposed)       PU Stage 4 Dressing:  (foam dressing on both stage III, Stage IV)               Personal Care Assistance Level of Assistance  Bathing, Feeding, Dressing Bathing Assistance: Limited assistance Feeding assistance: Limited assistance Dressing Assistance: Limited assistance     Functional Limitations Info  Sight, Hearing, Speech Sight Info: Adequate Hearing Info: Adequate Speech Info: Adequate    SPECIAL CARE FACTORS FREQUENCY                      Contractures      Additional Factors Info  Code Status, Allergies Code Status Info: Full  Allergies Info: BACLOFEN, FLEXERIL CYCLOBENZAPRINE, POTASSIUM, SILVER, ZINC, CARBAMAZEPINE, CHOCOLATE, FLOUR, MEAT EXTRACT, OTHER, PEANUT-CONTAINING DRUG PRODUCTS, PUMPKIN SEED, ROSUVASTATIN CALCIUM, STATINS, SULFONAMIDE DERIVATIVES, SUNFLOWER SEED SUNFLOWER OIL, TAPE            Current Medications (01/18/2017):  This is the current hospital active medication list Current Facility-Administered Medications  Medication Dose Route Frequency Provider Last Rate Last Dose  . acetaminophen (TYLENOL) tablet 650 mg  650 mg Oral Q6H PRN Deneise Lever, MD   650 mg at 01/18/17 0426   Or  . acetaminophen (TYLENOL) suppository 650 mg  650 mg Rectal  Q6H PRN Deneise Lever, MD      . ceFEPIme (MAXIPIME) 1 g in dextrose 5 % 50 mL IVPB  1 g Intravenous Q8H Gust Rung, DO   Stopped at 01/18/17 0553  . heparin injection 5,000 Units  5,000 Units Subcutaneous Q8H Deneise Lever, MD   5,000 Units at 01/18/17 0523  . promethazine (PHENERGAN) tablet 12.5 mg  12.5 mg Oral Q6H PRN Deneise Lever, MD      . senna-docusate (Senokot-S) tablet 1 tablet  1 tablet Oral QHS PRN  Deneise Lever, MD      . sodium chloride flush (NS) 0.9 % injection 3 mL  3 mL Intravenous Q12H Deneise Lever, MD   3 mL at 01/18/17 1040     Discharge Medications: Please see discharge summary for a list of discharge medications.  Relevant Imaging Results:  Relevant Lab Results:   Additional Information PZ:[id number];EUMPN care  Tresa Moore, LCSW

## 2017-01-18 NOTE — Progress Notes (Signed)
Pharmacy Antibiotic Note  Cameron Zavala is a 41 y.o. male admitted on 01/17/2017 with bacteremia.  Pharmacy has been consulted for ceftriaxone dosing.  Narrowed from cefepime to ceftriaxone.  Plan: Ceftriaxone 1g IV q24h starting tonight Follow for final sensitivities   Height: 5\' 4"  (162.6 cm) Weight: 135 lb (61.2 kg) IBW/kg (Calculated) : 59.2  Temp (24hrs), Avg:100.5 F (38.1 C), Min:98.6 F (37 C), Max:103.3 F (39.6 C)   Recent Labs Lab 01/17/17 1141 01/17/17 1213 01/17/17 1502 01/17/17 1814 01/17/17 2049 01/18/17 0330  WBC 11.3*  --   --   --   --  18.3*  CREATININE 0.85  --   --   --   --  1.05  LATICACIDVEN  --  2.57* 2.61* 1.5 1.2  --     Estimated Creatinine Clearance: 78.3 mL/min (by C-G formula based on SCr of 1.05 mg/dL).    Allergies  Allergen Reactions  . Baclofen Itching  . Flexeril [Cyclobenzaprine] Other (See Comments)    Extreme tiredness for 24 hours  . Potassium Nausea And Vomiting  . Silver Itching  . Zinc Swelling  . Carbamazepine Hives and Rash  . Chocolate Itching and Rash  . Flour Itching and Rash    Toast, biscuits rolls   . Meat Extract Itching and Rash    Different types of steak  . Other Itching and Rash    Vanilla pudding,  cookies (wafers with white cream in the middle), microwave meals  . Peanut-Containing Drug Products Itching and Rash    Red lips  . Pumpkin Seed Itching and Rash  . Rosuvastatin Calcium Itching and Rash  . Statins Itching and Rash  . Sulfonamide Derivatives Itching and Rash  . Sunflower Seed [Sunflower Oil] Itching and Rash  . Tape Itching and Rash    Plastic tape.  Can only use paper tape.    CTX x 1 4/26; 4/27>> Cefepime 4/26>>4/27  4/26 BCx >> 2/2 GNR (BCID - Kleb oxytoca, no R detected) 4/26 MRSA PCR >> neg 4/26 UCx >>  Thank you for allowing pharmacy to be a part of this patient's care.  Bernadetta Roell D. Harlyn Italiano, PharmD, BCPS Clinical Pharmacist Pager: [phone] 01/18/2017 2:46 PM

## 2017-01-18 NOTE — Clinical Social Work Note (Signed)
Clinical Social Work Assessment  Patient Details  Name: Cameron Zavala MRN: [id number] Date of Birth: 1976/03/24  Date of referral:  01/18/17               Reason for consult:  Facility Placement                Permission sought to share information with:  Chartered certified accountant granted to share information::  Yes, Verbal Permission Granted  Name::        Agency::  [facility]  Relationship::     Contact Information:     Housing/Transportation Living arrangements for the past 2 months:  [place] of Information:  Patient, Parent Patient Interpreter Needed:  None Criminal Activity/Legal Involvement Pertinent to Current Situation/Hospitalization:  No - Comment as needed Significant Relationships:  Parents Lives with:  Facility Resident Do you feel safe going back to the place where you live?  Yes Need for family participation in patient care:  No (Coment)  Care giving concerns:  Patient resided at [facility] prior to hospitalization. Patient can return to [facility] facility when medically cleared to do so.  Social Worker assessment / plan:  CSW met with patient and mom at bedside. Patient and mom agree with return to [facility] when patient is DC from hospital. They understand the process as patient came from nursing facility. CSW explained her role and plan for DC at appropriate time. CSW advised that [facility] has bed and no issues with returning to facility. No issues or concerns at this time.  Employment status:  Disabled (Comment on whether or not currently receiving Disability) Insurance information:  Medicaid In [place] PT Recommendations:  No Follow Up Information / Referral to community resources:  [place]  Patient/Family's Response to care:  Patient and family are pleased with assistance from [place]. They report no issues or concerns at this time.  Patient/Family's Understanding of and Emotional Response  to Diagnosis, Current Treatment, and Prognosis:  Patient and mom have a good understanding of the diagnosis, current treatment and prognosis. They are agreeable with the plan to DC to [facility]. They report no issues or concerns at this time.  Emotional Assessment Appearance:    Attitude/Demeanor/Rapport:   (Cooperative) Affect (typically observed):    Orientation:  Oriented to Self, Oriented to Place, Oriented to  Time, Oriented to Situation Alcohol / Substance use:  Not Applicable Psych involvement (Current and /or in the community):  No (Comment)  Discharge Needs  Concerns to be addressed:  Care Coordination Readmission within the last 30 days:  No Current discharge risk:  Dependent with Mobility, Physical Impairment Barriers to Discharge:  No Barriers Identified   Normajean Baxter, LCSW 01/18/2017, 4:19 PM

## 2017-01-18 NOTE — Consult Note (Signed)
WOC Nurse wound consult note Reason for Consult: Consult requested for left and right ischium wounds.  Pt is familiar to WOC from previous admissions.  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 Dressing procedure/placement/frequency: Aquacel to absorb drainage and provide antimicrobial benefits to bilat ischium.  Pt is on a low airloss bed to reduce pressure.  Please re-consult if further assistance is needed.  Thank-you,  Cammie Mcgee MSN, RN, CWOCN, [place], CNS [phone]
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
## 2017-01-18 NOTE — Progress Notes (Signed)
  PHARMACY - PHYSICIAN COMMUNICATION CRITICAL VALUE ALERT - BLOOD CULTURE IDENTIFICATION (BCID)  Results for orders placed or performed during the hospital encounter of 01/17/17  Blood Culture ID Panel (Reflexed) (Collected: 01/17/2017 11:10 AM)  Result Value Ref Range   Enterococcus species NOT DETECTED NOT DETECTED   Listeria monocytogenes NOT DETECTED NOT DETECTED   Staphylococcus species NOT DETECTED NOT DETECTED   Staphylococcus aureus NOT DETECTED NOT DETECTED   Streptococcus species NOT DETECTED NOT DETECTED   Streptococcus agalactiae NOT DETECTED NOT DETECTED   Streptococcus pneumoniae NOT DETECTED NOT DETECTED   Streptococcus pyogenes NOT DETECTED NOT DETECTED   Acinetobacter baumannii NOT DETECTED NOT DETECTED   Enterobacteriaceae species DETECTED (A) NOT DETECTED   Enterobacter cloacae complex NOT DETECTED NOT DETECTED   Escherichia coli NOT DETECTED NOT DETECTED   Klebsiella oxytoca DETECTED (A) NOT DETECTED   Klebsiella pneumoniae NOT DETECTED NOT DETECTED   Proteus species NOT DETECTED NOT DETECTED   Serratia marcescens NOT DETECTED NOT DETECTED   Carbapenem resistance NOT DETECTED NOT DETECTED   Haemophilus influenzae NOT DETECTED NOT DETECTED   Neisseria meningitidis NOT DETECTED NOT DETECTED   Pseudomonas aeruginosa NOT DETECTED NOT DETECTED   Candida albicans NOT DETECTED NOT DETECTED   Candida glabrata NOT DETECTED NOT DETECTED   Candida krusei NOT DETECTED NOT DETECTED   Candida parapsilosis NOT DETECTED NOT DETECTED   Candida tropicalis NOT DETECTED NOT DETECTED    Name of physician (or Provider) Contacted: Dr. Nelson Chimes  Changes to prescribed antibiotics required: Recommended change to Rocephin however MD wanted to keep Cefepime for now due to continued fevers and rising WBC count.   Rolley Sims 01/18/2017  8:53 AM

## 2017-01-19 LAB — HEPATIC FUNCTION PANEL
ALK PHOS: 89 U/L (ref 25–125)
ALT: 15 U/L (ref 10–40)
AST: 28 U/L (ref 14–40)
Bilirubin, Total: 0.9 mg/dL

## 2017-01-19 LAB — CBC
HEMATOCRIT: 32.6 % — AB (ref 39.0–52.0)
Hemoglobin: 10.7 g/dL — ABNORMAL LOW (ref 13.0–17.0)
MCH: 25.9 pg — AB (ref 26.0–34.0)
MCHC: 32.8 g/dL (ref 30.0–36.0)
MCV: 78.9 fL (ref 78.0–100.0)
Platelets: 172 10*3/uL (ref 150–400)
RBC: 4.13 MIL/uL — AB (ref 4.22–5.81)
RDW: 16.5 % — ABNORMAL HIGH (ref 11.5–15.5)
WBC: 10.5 10*3/uL (ref 4.0–10.5)

## 2017-01-19 LAB — CBC AND DIFFERENTIAL
HCT: 33 % — AB (ref 41–53)
Hemoglobin: 10.7 g/dL — AB (ref 13.5–17.5)
Platelets: 172 10*3/uL (ref 150–399)
WBC: 10.5 10^3/mL

## 2017-01-19 LAB — BASIC METABOLIC PANEL
BUN: 18 mg/dL (ref 4–21)
CREATININE: 1.1 mg/dL (ref 0.6–1.3)
Glucose: 100 mg/dL
POTASSIUM: 4.8 mmol/L (ref 3.4–5.3)
SODIUM: 137 mmol/L (ref 137–147)

## 2017-01-19 LAB — GLUCOSE, CAPILLARY: Glucose-Capillary: 85 mg/dL (ref 65–99)

## 2017-01-19 MED ORDER — CEFTRIAXONE SODIUM 2 G IJ SOLR
2.0000 g | INTRAMUSCULAR | Status: DC
  Administered 2017-01-19: 2 g via INTRAVENOUS
  Filled 2017-01-19: qty 2

## 2017-01-19 NOTE — Progress Notes (Signed)
Pharmacy: Ceftriaxone  40yom with klebsiella oxytoca bacteremia and UTI, switched from cefepime to ceftriaxone yesterday.   CTX x 1 4/26; 4/27>> Cefepime 4/26>>4/27  4/26 BCx >> 2/2 GNR (BCID - Kleb oxytoca, no R detected) 4/26 MRSA PCR >> neg 4/26 UCx >> Kleb oxytoca (pan-sensitive except R-ampicillin)  Plan: 1) Increase ceftriaxone to 2g IV q24 2) Pharmacy will sign off as no further dose adjustments needed  Louie Casa, PharmD, BCPS 01/19/17, 12:22 PM
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
## 2017-01-20 LAB — BASIC METABOLIC PANEL
ANION GAP: 9 (ref 5–15)
BUN: 13 mg/dL (ref 4–21)
BUN: 13 mg/dL (ref 6–20)
CO2: 23 mmol/L (ref 22–32)
Calcium: 8.7 mg/dL — ABNORMAL LOW (ref 8.9–10.3)
Chloride: 103 mmol/L (ref 101–111)
Creatinine, Ser: 0.75 mg/dL (ref 0.61–1.24)
Creatinine: 0.8 mg/dL (ref 0.6–1.3)
Glucose, Bld: 99 mg/dL (ref 65–99)
Glucose: 99 mg/dL
POTASSIUM: 3.5 mmol/L (ref 3.5–5.1)
SODIUM: 135 mmol/L — AB (ref 137–147)
SODIUM: 135 mmol/L (ref 135–145)

## 2017-01-20 LAB — CULTURE, BLOOD (ROUTINE X 2)
SPECIAL REQUESTS: ADEQUATE
Special Requests: ADEQUATE

## 2017-01-20 LAB — CBC WITH DIFFERENTIAL/PLATELET
BASOS ABS: 0 10*3/uL (ref 0.0–0.1)
BASOS PCT: 0 %
Eosinophils Absolute: 0.1 10*3/uL (ref 0.0–0.7)
Eosinophils Relative: 1 %
HEMATOCRIT: 33.6 % — AB (ref 39.0–52.0)
Hemoglobin: 11.1 g/dL — ABNORMAL LOW (ref 13.0–17.0)
LYMPHS PCT: 8 %
Lymphs Abs: 0.7 10*3/uL (ref 0.7–4.0)
MCH: 26.2 pg (ref 26.0–34.0)
MCHC: 33 g/dL (ref 30.0–36.0)
MCV: 79.2 fL (ref 78.0–100.0)
MONO ABS: 0.7 10*3/uL (ref 0.1–1.0)
Monocytes Relative: 7 %
NEUTROS ABS: 7.7 10*3/uL (ref 1.7–7.7)
Neutrophils Relative %: 84 %
PLATELETS: 185 10*3/uL (ref 150–400)
RBC: 4.24 MIL/uL (ref 4.22–5.81)
RDW: 16.5 % — AB (ref 11.5–15.5)
WBC: 9.2 10*3/uL (ref 4.0–10.5)

## 2017-01-20 LAB — URINE CULTURE

## 2017-01-20 LAB — CBC AND DIFFERENTIAL: WBC: 9.2 10*3/mL

## 2017-01-20 LAB — GLUCOSE, CAPILLARY: Glucose-Capillary: 86 mg/dL (ref 65–99)

## 2017-01-20 MED ORDER — DEXTROSE 5 % IV SOLN
2.0000 g | Freq: Three times a day (TID) | INTRAVENOUS | Status: DC
  Administered 2017-01-20 – 2017-01-23 (×9): 2 g via INTRAVENOUS
  Filled 2017-01-20 (×10): qty 2

## 2017-01-20 MED ORDER — DEXTROSE 5 % IV SOLN
2.0000 g | INTRAVENOUS | Status: DC
  Filled 2017-01-20: qty 2

## 2017-01-20 MED ORDER — TRAMADOL HCL 50 MG PO TABS
50.0000 mg | ORAL_TABLET | Freq: Four times a day (QID) | ORAL | Status: DC | PRN
  Administered 2017-01-20 – 2017-01-22 (×4): 50 mg via ORAL
  Filled 2017-01-20 (×4): qty 1

## 2017-01-20 NOTE — Progress Notes (Signed)
Pharmacy Antibiotic Note  Cameron Zavala is a 41 y.o. male on ceftriaxone for klebsiella bacteremia/UTI now with pseudomonas in her urine and continued fevers. Will switch to cefepime. Renal function stable.   Plan: 1) Cefepime 2g IV q8  Height: 5\' 4"  (162.6 cm) Weight: 140 lb (63.5 kg) IBW/kg (Calculated) : 59.2  Temp (24hrs), Avg:99 F (37.2 C), Min:98.4 F (36.9 C), Max:100.6 F (38.1 C)   Recent Labs Lab 01/17/17 1141 01/17/17 1213 01/17/17 1502 01/17/17 1814 01/17/17 2049 01/18/17 0330 01/19/17 1114 01/20/17 0325  WBC 11.3*  --   --   --   --  18.3* 10.5 9.2  CREATININE 0.85  --   --   --   --  1.05  --  0.75  LATICACIDVEN  --  2.57* 2.61* 1.5 1.2  --   --   --     Estimated Creatinine Clearance: 102.8 mL/min (by C-G formula based on SCr of 0.75 mg/dL).    Allergies  Allergen Reactions  . Baclofen Itching  . Flexeril [Cyclobenzaprine] Other (See Comments)    Extreme tiredness for 24 hours  . Potassium Nausea And Vomiting  . Silver Itching  . Zinc Swelling  . Carbamazepine Hives and Rash  . Chocolate Itching and Rash  . Flour Itching and Rash    Toast, biscuits rolls   . Meat Extract Itching and Rash    Different types of steak  . Other Itching and Rash    Vanilla pudding,  cookies (wafers with white cream in the middle), microwave meals  . Peanut-Containing Drug Products Itching and Rash    Red lips  . Pumpkin Seed Itching and Rash  . Rosuvastatin Calcium Itching and Rash  . Statins Itching and Rash  . Sulfonamide Derivatives Itching and Rash  . Sunflower Seed [Sunflower Oil] Itching and Rash  . Tape Itching and Rash    Plastic tape.  Can only use paper tape.    Antimicrobials this admission: Ceftriaxone x 1 4/26; 4/27>> 4/29 Cefepime 4/26 >> 4/27 >>4.29 >>  Microbiology results: 4/26 BCx >> 2/2 GNR (BCID - Kleb oxytoca, no R detected) 4/26 MRSA PCR >> neg 4/26 UCx >> Kleb oxytoca (pan-sensitive except R-ampicillin) 4/26 UCx >> Pseudomonas  (S-ceftaz, cefepime, cipro, gent, imi, zosyn)  Thank you for allowing pharmacy to be a part of this patient's care.  Fredrik Rigger 01/20/2017 12:59 PM
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
## 2017-01-21 DIAGNOSIS — B965 Pseudomonas (aeruginosa) (mallei) (pseudomallei) as the cause of diseases classified elsewhere: Secondary | ICD-10-CM

## 2017-01-21 DIAGNOSIS — B961 Klebsiella pneumoniae [K. pneumoniae] as the cause of diseases classified elsewhere: Secondary | ICD-10-CM

## 2017-01-21 DIAGNOSIS — L89154 Pressure ulcer of sacral region, stage 4: Secondary | ICD-10-CM

## 2017-01-21 LAB — GLUCOSE, CAPILLARY: GLUCOSE-CAPILLARY: 90 mg/dL (ref 65–99)

## 2017-01-21 MED ORDER — LISINOPRIL 20 MG PO TABS
20.0000 mg | ORAL_TABLET | Freq: Every day | ORAL | Status: DC
  Administered 2017-01-21 – 2017-01-23 (×3): 20 mg via ORAL
  Filled 2017-01-21 (×3): qty 1

## 2017-01-21 NOTE — Progress Notes (Signed)
RN attempted to place new urethral catheter per MD order but RN was unable to pass the catheter and then patient began to bleed from urethra.  MD Nelson Chimes informed, MD advised she will discuss with urology.

## 2017-01-21 NOTE — Progress Notes (Addendum)
Sent MD Nelson Chimes a page to inform that urology has not yet come to bedside to place urethral catheter.   MD Nelson Chimes requested bladder scan be completed and if there is 800 or more mLs of urine present in bladder to page MD Nelson Chimes and MD will follow up with urology.  Bladder scan completed and 140 mL of urine present in bladder.
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
## 2017-01-22 DIAGNOSIS — Z598 Other problems related to housing and economic circumstances: Secondary | ICD-10-CM

## 2017-01-22 LAB — GLUCOSE, CAPILLARY: Glucose-Capillary: 102 mg/dL — ABNORMAL HIGH (ref 65–99)
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
## 2017-01-22 NOTE — Progress Notes (Signed)
  Date: 01/22/2017  Patient name: Cameron Zavala  Medical record number: [id number]  Date of birth: 1975/12/19   I have seen and evaluated this patient and I have discussed the plan of care with the house staff. Please see their note for complete details. I concur with their findings with the following additions/corrections: [name] was seen on morning rounds. He had no complaints other than his right antecubital IV leaking. He had a second left antecubital IV. He is stable for discharge to complete IV ABX as an outpatient. However, we got word that his Medicaid was terminated and his SNF will no longer accept him. His wife is unable to care for him as she has limited sight. We will need aggressive care management and social work as he does not have a safe discharge plan currently. Dr. Shanda Bumps progress note to follow.   Burns Spain, MD 01/22/2017, 3:33 PM
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
## 2017-01-23 DIAGNOSIS — E785 Hyperlipidemia, unspecified: Secondary | ICD-10-CM

## 2017-01-23 DIAGNOSIS — Z882 Allergy status to sulfonamides status: Secondary | ICD-10-CM

## 2017-01-23 DIAGNOSIS — L89314 Pressure ulcer of right buttock, stage 4: Secondary | ICD-10-CM

## 2017-01-23 DIAGNOSIS — Z881 Allergy status to other antibiotic agents status: Secondary | ICD-10-CM

## 2017-01-23 DIAGNOSIS — Z888 Allergy status to other drugs, medicaments and biological substances status: Secondary | ICD-10-CM

## 2017-01-23 DIAGNOSIS — Z91018 Allergy to other foods: Secondary | ICD-10-CM

## 2017-01-23 DIAGNOSIS — N39498 Other specified urinary incontinence: Secondary | ICD-10-CM

## 2017-01-23 DIAGNOSIS — Z9102 Food additives allergy status: Secondary | ICD-10-CM

## 2017-01-23 DIAGNOSIS — L89324 Pressure ulcer of left buttock, stage 4: Secondary | ICD-10-CM

## 2017-01-23 DIAGNOSIS — Z91010 Allergy to peanuts: Secondary | ICD-10-CM

## 2017-01-23 DIAGNOSIS — Z91048 Other nonmedicinal substance allergy status: Secondary | ICD-10-CM

## 2017-01-23 LAB — GLUCOSE, CAPILLARY: GLUCOSE-CAPILLARY: 108 mg/dL — AB (ref 65–99)

## 2017-01-23 MED ORDER — DEXTROSE 5 % IV SOLN: 2.0000 g | Freq: Three times a day (TID) | INTRAVENOUS | 0 refills | Status: AC
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
## 2017-01-23 NOTE — Progress Notes (Signed)
  Date: 01/23/2017  Patient name: Cameron Zavala  Medical record number: [id number]  Date of birth: 11-26-1975   I have seen and evaluated this patient and I have discussed the plan of care with the house staff. Please see their note for complete details. I concur with their findings with the following additions/corrections: Mr. [name] was seen on morning rounds. Following rounds we were informed that he had a bed available at a SNF. He was medically stable for discharge and Dr Nelson Chimes completed all necessary paperwork in order for him to be sent to SNF today.  Burns Spain, MD 01/23/2017, 11:54 AM
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
## 2017-01-23 NOTE — Clinical Social Work Placement (Signed)
   CLINICAL SOCIAL WORK PLACEMENT  NOTE  Date:  01/23/2017  Patient Details  Name: Cameron Zavala MRN: [id number] Date of Birth: 10-Jan-1976  Clinical Social Work is seeking post-discharge placement for this patient at the Skilled  Nursing Facility level of care (*CSW will initial, date and re-position this form in  chart as items are completed):  Yes   Patient/family provided with [facility] Clinical Social Work Department's list of facilities offering this level of care within the geographic area requested by the patient (or if unable, by the patient's family).  Yes   Patient/family informed of their freedom to choose among providers that offer the needed level of care, that participate in Medicare, Medicaid or managed care program needed by the patient, have an available bed and are willing to accept the patient.  Yes   Patient/family informed of [facility]'s ownership interest in [facility] and [facility], as well as of the fact that they are under no obligation to receive care at these facilities.  PASRR submitted to EDS on       PASRR number received on       Existing PASRR number confirmed on 01/18/17     FL2 transmitted to all facilities in geographic area requested by pt/family on 01/18/17     FL2 transmitted to all facilities within larger geographic area on 01/18/17     Patient informed that his/her managed care company has contracts with or will negotiate with certain facilities, including the following:        Yes   Patient/family informed of bed offers received.  Patient chooses bed at [facility]     Physician recommends and patient chooses bed at      Patient to be transferred to [facility] on 01/23/17.  Patient to be transferred to facility by [facility]     Patient family notified on 01/23/17 of transfer.  Name of family member notified:  spouse     PHYSICIAN Please prepare priority discharge summary, including  medications, Please prepare prescriptions, Please sign FL2     Additional Comment:    _______________________________________________ Tresa Moore, LCSW 01/23/2017, 12:51 PM

## 2017-01-23 NOTE — Social Work (Signed)
Clinical Social Worker facilitated patient discharge including contacting patient family and facility to confirm patient discharge plans.  Clinical information faxed to facility and family agreeable with plan.  CSW arranged ambulance transport via [facility] to [place] [facility].  RN to call [phone] report prior to discharge.  Clinical Social Worker will sign off for now as social work intervention is no longer needed. Please consult us again if new need arises.  Cerinity Zynda, LCSW Clinical Social Worker [phone]    

## 2017-01-24 ENCOUNTER — Encounter: Payer: Self-pay | Admitting: Internal Medicine

## 2017-01-24 ENCOUNTER — Non-Acute Institutional Stay (SKILLED_NURSING_FACILITY): Payer: Medicaid Other | Admitting: Internal Medicine

## 2017-01-24 DIAGNOSIS — N3090 Cystitis, unspecified without hematuria: Secondary | ICD-10-CM

## 2017-01-24 DIAGNOSIS — A4159 Other Gram-negative sepsis: Secondary | ICD-10-CM

## 2017-01-24 DIAGNOSIS — B9689 Other specified bacterial agents as the cause of diseases classified elsewhere: Secondary | ICD-10-CM

## 2017-01-24 DIAGNOSIS — L89323 Pressure ulcer of left buttock, stage 3: Secondary | ICD-10-CM

## 2017-01-24 DIAGNOSIS — F32A Depression, unspecified: Secondary | ICD-10-CM

## 2017-01-24 DIAGNOSIS — G8220 Paraplegia, unspecified: Secondary | ICD-10-CM

## 2017-01-24 DIAGNOSIS — I10 Essential (primary) hypertension: Secondary | ICD-10-CM | POA: Diagnosis not present

## 2017-01-24 DIAGNOSIS — E559 Vitamin D deficiency, unspecified: Secondary | ICD-10-CM

## 2017-01-24 DIAGNOSIS — R7881 Bacteremia: Secondary | ICD-10-CM | POA: Diagnosis not present

## 2017-01-24 DIAGNOSIS — A414 Sepsis due to anaerobes: Secondary | ICD-10-CM | POA: Diagnosis not present

## 2017-01-24 DIAGNOSIS — N390 Urinary tract infection, site not specified: Secondary | ICD-10-CM

## 2017-01-24 DIAGNOSIS — B961 Klebsiella pneumoniae [K. pneumoniae] as the cause of diseases classified elsewhere: Secondary | ICD-10-CM

## 2017-01-24 DIAGNOSIS — F329 Major depressive disorder, single episode, unspecified: Secondary | ICD-10-CM | POA: Diagnosis not present

## 2017-01-24 DIAGNOSIS — S3730XD Unspecified injury of urethra, subsequent encounter: Secondary | ICD-10-CM

## 2017-01-24 DIAGNOSIS — B965 Pseudomonas (aeruginosa) (mallei) (pseudomallei) as the cause of diseases classified elsewhere: Secondary | ICD-10-CM

## 2017-01-24 DIAGNOSIS — L89314 Pressure ulcer of right buttock, stage 4: Secondary | ICD-10-CM
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
## 2017-01-25 LAB — CULTURE, BLOOD (ROUTINE X 2)
Culture: NO GROWTH
Culture: NO GROWTH
SPECIAL REQUESTS: ADEQUATE

## 2017-01-29 ENCOUNTER — Encounter: Payer: Self-pay | Admitting: Internal Medicine

## 2017-01-30 DIAGNOSIS — N3090 Cystitis, unspecified without hematuria: Secondary | ICD-10-CM

## 2017-01-30 DIAGNOSIS — L89323 Pressure ulcer of left buttock, stage 3: Secondary | ICD-10-CM | POA: Insufficient documentation

## 2017-01-30 DIAGNOSIS — B9689 Other specified bacterial agents as the cause of diseases classified elsewhere: Secondary | ICD-10-CM | POA: Insufficient documentation

## 2017-01-30 DIAGNOSIS — R7881 Bacteremia: Secondary | ICD-10-CM | POA: Insufficient documentation

## 2017-01-30 DIAGNOSIS — S3730XA Unspecified injury of urethra, initial encounter: Secondary | ICD-10-CM | POA: Insufficient documentation

## 2017-01-30 DIAGNOSIS — B961 Klebsiella pneumoniae [K. pneumoniae] as the cause of diseases classified elsewhere: Secondary | ICD-10-CM | POA: Insufficient documentation

## 2017-02-08 ENCOUNTER — Encounter: Payer: Medicaid Other | Admitting: Internal Medicine

## 2017-02-20 ENCOUNTER — Encounter: Payer: Self-pay | Admitting: *Deleted

## 2017-02-26 ENCOUNTER — Non-Acute Institutional Stay (SKILLED_NURSING_FACILITY): Payer: Medicaid Other | Admitting: Internal Medicine

## 2017-02-26 ENCOUNTER — Encounter: Payer: Self-pay | Admitting: Internal Medicine

## 2017-02-26 DIAGNOSIS — E559 Vitamin D deficiency, unspecified: Secondary | ICD-10-CM | POA: Diagnosis not present

## 2017-02-26 DIAGNOSIS — I10 Essential (primary) hypertension: Secondary | ICD-10-CM | POA: Diagnosis not present

## 2017-02-26 DIAGNOSIS — F329 Major depressive disorder, single episode, unspecified: Secondary | ICD-10-CM

## 2017-02-26 DIAGNOSIS — F32A Depression, unspecified: Secondary | ICD-10-CM
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
## 2017-03-25 ENCOUNTER — Non-Acute Institutional Stay (SKILLED_NURSING_FACILITY): Payer: Medicaid Other | Admitting: Internal Medicine

## 2017-03-25 ENCOUNTER — Encounter: Payer: Self-pay | Admitting: Internal Medicine

## 2017-03-25 DIAGNOSIS — Z960 Presence of urogenital implants: Principal | ICD-10-CM

## 2017-03-25 DIAGNOSIS — Z9289 Personal history of other medical treatment: Secondary | ICD-10-CM | POA: Diagnosis not present

## 2017-03-25 DIAGNOSIS — L89314 Pressure ulcer of right buttock, stage 4: Secondary | ICD-10-CM

## 2017-03-25 DIAGNOSIS — L89323 Pressure ulcer of left buttock, stage 3: Secondary | ICD-10-CM | POA: Diagnosis not present

## 2017-03-25 DIAGNOSIS — Z978 Presence of other specified devices: Secondary | ICD-10-CM
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
## 2017-04-07 ENCOUNTER — Encounter: Payer: Self-pay | Admitting: Internal Medicine

## 2017-04-07 NOTE — Assessment & Plan Note (Signed)
Controlled; continue lisinopril 20 mg by mouth daily

## 2017-04-07 NOTE — Assessment & Plan Note (Signed)
Chronic; continue Celexa 20 mg by mouth daily

## 2017-04-07 NOTE — Assessment & Plan Note (Signed)
Stable with no fractures; patient needs a new vitamin D level, will order; continue vitamin D replacement 1000 units daily by mouth

## 2017-04-10 LAB — CBC AND DIFFERENTIAL
HCT: 43 (ref 41–53)
Hemoglobin: 14.3 (ref 13.5–17.5)
PLATELETS: 178 (ref 150–399)
WBC: 7.6

## 2017-04-10 LAB — HEPATIC FUNCTION PANEL
ALT: 13 (ref 10–40)
AST: 20 (ref 14–40)
Alkaline Phosphatase: 110 (ref 25–125)
BILIRUBIN, TOTAL: 0.2

## 2017-04-10 LAB — BASIC METABOLIC PANEL
BUN: 18 (ref 4–21)
CREATININE: 0.7 (ref 0.6–1.3)
GLUCOSE: 82
POTASSIUM: 4.5 (ref 3.4–5.3)
SODIUM: 139 (ref 137–147)

## 2017-04-10 LAB — LIPID PANEL
Cholesterol: 192 (ref 0–200)
HDL: 40 (ref 35–70)
LDL Cholesterol: 132
Triglycerides: 98 (ref 40–160)

## 2017-04-10 LAB — VITAMIN D 25 HYDROXY (VIT D DEFICIENCY, FRACTURES): VIT D 25 HYDROXY: 35.78

## 2017-04-10 LAB — HEMOGLOBIN A1C: Hemoglobin A1C: 5.2

## 2017-04-10 LAB — TSH: TSH: 1.73 (ref 0.41–5.90)

## 2017-04-14 ENCOUNTER — Encounter: Payer: Self-pay | Admitting: Internal Medicine

## 2017-04-14 NOTE — Assessment & Plan Note (Deleted)
Wound is gradually healing, not quite as quickly as a similar wound on the left side; plan to continue vitamin C 500 mg daily for wound healing as well as pro-stat 30 mils twice a day for wound healing

## 2017-04-14 NOTE — Assessment & Plan Note (Signed)
Wound is gradually healing, not quite as quickly as a similar wound on the left side; plan to continue vitamin C 500 mg daily for wound healing as well as pro-stat 30 mils twice a day for wound healing 

## 2017-04-14 NOTE — Assessment & Plan Note (Addendum)
In place due to bilateral sacral pressure wounds and neurogenic bladder; continue chronic indwelling Foley catheter which should be changed out monthly

## 2017-04-14 NOTE — Assessment & Plan Note (Signed)
Healing well; plan to continue wound care program care nurse along with vitamin C supplement 500 mg daily (pro-stat 30 mg twice a day to improve healing

## 2017-04-16 ENCOUNTER — Encounter: Payer: Self-pay | Admitting: Internal Medicine

## 2017-04-16 ENCOUNTER — Non-Acute Institutional Stay (SKILLED_NURSING_FACILITY): Payer: Medicaid Other | Admitting: Internal Medicine

## 2017-04-16 DIAGNOSIS — N3000 Acute cystitis without hematuria: Secondary | ICD-10-CM

## 2017-04-16 DIAGNOSIS — B964 Proteus (mirabilis) (morganii) as the cause of diseases classified elsewhere: Secondary | ICD-10-CM

## 2017-04-16 DIAGNOSIS — A498 Other bacterial infections of unspecified site: Secondary | ICD-10-CM
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
## 2017-05-01 ENCOUNTER — Encounter: Payer: Self-pay | Admitting: Internal Medicine

## 2017-05-01 ENCOUNTER — Non-Acute Institutional Stay (SKILLED_NURSING_FACILITY): Payer: Medicaid Other | Admitting: Internal Medicine

## 2017-05-01 DIAGNOSIS — E559 Vitamin D deficiency, unspecified: Secondary | ICD-10-CM | POA: Diagnosis not present

## 2017-05-01 DIAGNOSIS — N3090 Cystitis, unspecified without hematuria: Secondary | ICD-10-CM | POA: Diagnosis not present

## 2017-05-01 DIAGNOSIS — F32A Depression, unspecified: Secondary | ICD-10-CM

## 2017-05-01 DIAGNOSIS — L89323 Pressure ulcer of left buttock, stage 3: Secondary | ICD-10-CM

## 2017-05-01 DIAGNOSIS — Z978 Presence of other specified devices: Secondary | ICD-10-CM

## 2017-05-01 DIAGNOSIS — L89314 Pressure ulcer of right buttock, stage 4: Secondary | ICD-10-CM

## 2017-05-01 DIAGNOSIS — I10 Essential (primary) hypertension: Secondary | ICD-10-CM

## 2017-05-01 DIAGNOSIS — Z9289 Personal history of other medical treatment: Secondary | ICD-10-CM | POA: Diagnosis not present

## 2017-05-01 DIAGNOSIS — Z960 Presence of urogenital implants: Secondary | ICD-10-CM

## 2017-05-01 DIAGNOSIS — N390 Urinary tract infection, site not specified: Secondary | ICD-10-CM

## 2017-05-01 DIAGNOSIS — G8220 Paraplegia, unspecified: Secondary | ICD-10-CM | POA: Diagnosis not present

## 2017-05-01 DIAGNOSIS — B9689 Other specified bacterial agents as the cause of diseases classified elsewhere: Secondary | ICD-10-CM | POA: Diagnosis not present

## 2017-05-01 DIAGNOSIS — B965 Pseudomonas (aeruginosa) (mallei) (pseudomallei) as the cause of diseases classified elsewhere: Secondary | ICD-10-CM

## 2017-05-01 DIAGNOSIS — A414 Sepsis due to anaerobes: Secondary | ICD-10-CM

## 2017-05-01 DIAGNOSIS — F329 Major depressive disorder, single episode, unspecified: Secondary | ICD-10-CM

## 2017-05-01 DIAGNOSIS — S3730XD Unspecified injury of urethra, subsequent encounter: Secondary | ICD-10-CM

## 2017-05-01 DIAGNOSIS — A4159 Other Gram-negative sepsis: Secondary | ICD-10-CM
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
## 2017-05-02 ENCOUNTER — Telehealth: Payer: Self-pay | Admitting: *Deleted

## 2017-05-02 ENCOUNTER — Telehealth: Payer: Self-pay

## 2017-05-02 NOTE — Telephone Encounter (Signed)
done

## 2017-05-02 NOTE — Telephone Encounter (Signed)
Pt is being disch from snf and will need HHN , VO given for eval and treat, pt will be disch 8/10 care will begin by [facility] sat 8/11, do you agree?

## 2017-05-02 NOTE — Telephone Encounter (Signed)
Cameron Zavala with [facility] requesting to speak with a nurse to see which attending can cosign orders for patient. Please call back.

## 2017-05-03 NOTE — Telephone Encounter (Signed)
Will he be returning to [facility]? If so, agree

## 2017-05-03 NOTE — Telephone Encounter (Signed)
yes

## 2017-05-06 NOTE — Telephone Encounter (Signed)
Has appt 8/16

## 2017-05-06 NOTE — Telephone Encounter (Signed)
VO OK Does he need appt sch?

## 2017-05-09 ENCOUNTER — Encounter: Payer: Self-pay | Admitting: Internal Medicine

## 2017-05-09 ENCOUNTER — Ambulatory Visit (INDEPENDENT_AMBULATORY_CARE_PROVIDER_SITE_OTHER): Payer: Medicaid Other | Admitting: Internal Medicine

## 2017-05-09 VITALS — BP 164/86 | HR 76 | Temp 98.6°F

## 2017-05-09 DIAGNOSIS — Z933 Colostomy status: Secondary | ICD-10-CM

## 2017-05-09 DIAGNOSIS — N39498 Other specified urinary incontinence: Secondary | ICD-10-CM

## 2017-05-09 DIAGNOSIS — Z872 Personal history of diseases of the skin and subcutaneous tissue: Secondary | ICD-10-CM | POA: Diagnosis not present

## 2017-05-09 DIAGNOSIS — G8220 Paraplegia, unspecified: Secondary | ICD-10-CM | POA: Diagnosis not present

## 2017-05-09 DIAGNOSIS — Z79899 Other long term (current) drug therapy: Secondary | ICD-10-CM

## 2017-05-09 DIAGNOSIS — Z87891 Personal history of nicotine dependence: Secondary | ICD-10-CM | POA: Diagnosis not present

## 2017-05-09 DIAGNOSIS — N318 Other neuromuscular dysfunction of bladder: Secondary | ICD-10-CM | POA: Diagnosis not present

## 2017-05-09 DIAGNOSIS — L8940 Pressure ulcer of contiguous site of back, buttock and hip, unspecified stage: Secondary | ICD-10-CM

## 2017-05-09 DIAGNOSIS — Z993 Dependence on wheelchair: Secondary | ICD-10-CM | POA: Diagnosis not present

## 2017-05-09 DIAGNOSIS — I10 Essential (primary) hypertension: Secondary | ICD-10-CM | POA: Diagnosis present

## 2017-05-09 DIAGNOSIS — L89319 Pressure ulcer of right buttock, unspecified stage: Secondary | ICD-10-CM | POA: Diagnosis not present

## 2017-05-09 MED ORDER — LISINOPRIL 40 MG PO TABS: 40.0000 mg | ORAL_TABLET | Freq: Every day | ORAL | 1 refills | Status: DC

## 2017-05-09 MED ORDER — HYDROCHLOROTHIAZIDE 25 MG PO TABS: 25.0000 mg | ORAL_TABLET | Freq: Every day | ORAL | 1 refills | Status: DC

## 2017-05-09 MED ORDER — CLOSED-END COLOSTOMY POUCH MISC: 11 refills | Status: AC

## 2017-05-09 MED ORDER — TRAMADOL HCL 50 MG PO TABS: 50.0000 mg | ORAL_TABLET | Freq: Four times a day (QID) | ORAL | 0 refills | Status: DC | PRN

## 2017-05-09 NOTE — Patient Instructions (Addendum)
Thank you for visiting clinic today. We  ordered your colostomy and incontinence supplies,you should be hearing [facility] in next few days. We also ordered your hospital bed with gel underlay. We also placed an order to get some home health care. Your blood pressure was high today, it was high during last few times it was checked at nursing facility. I am increasing her lisinopril to 40 mg daily and keeping the same dose of hydrochlorothiazide. Now you have to take 2 pills in the morning instead of one combination pill to use to take before. Please follow-up in 4 weeks for blood pressure check.

## 2017-05-09 NOTE — Assessment & Plan Note (Signed)
BP Readings from Last 3 Encounters:  05/09/17 (!) 164/86  05/01/17 (!) 150/79  04/16/17 136/81   His blood pressure was elevated today. According to patient he is compliant with his medicine,and did took his medications before coming to the clinic today.  -increase his dose of lisinopril to 40 mg daily. -Continue hydrochlorothiazide at 25 mg daily. -Follow up in 4 weeks for BP check.
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
## 2017-05-09 NOTE — Assessment & Plan Note (Signed)
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  Currently his wounds are healing now.  He will get benefit with home health care to keep an eye on his wounds.
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
## 2017-05-09 NOTE — Assessment & Plan Note (Signed)
Currently he is not on any in during catheter, as his wounds have been healed.  He is using adult diapers. He was advised to keep himself tried to prevent any more infection.  And incontinence supply was ordered.

## 2017-05-10 NOTE — Progress Notes (Signed)
Internal Medicine Clinic Attending  Case discussed with Dr. Amin at the time of the visit.  We reviewed the resident's history and exam and pertinent patient test results.  I agree with the assessment, diagnosis, and plan of care documented in the resident's note.    

## 2017-05-12 ENCOUNTER — Encounter: Payer: Self-pay | Admitting: Internal Medicine

## 2017-05-16 NOTE — Addendum Note (Signed)
Addended by: Arnetha Courser on: 05/16/2017 03:13 PM   Modules accepted: Orders

## 2017-05-20 DIAGNOSIS — L89313 Pressure ulcer of right buttock, stage 3: Secondary | ICD-10-CM | POA: Diagnosis not present

## 2017-05-20 DIAGNOSIS — G8220 Paraplegia, unspecified: Secondary | ICD-10-CM | POA: Diagnosis not present

## 2017-05-20 DIAGNOSIS — M86159 Other acute osteomyelitis, unspecified femur: Secondary | ICD-10-CM | POA: Diagnosis not present

## 2017-05-21 DIAGNOSIS — H353131 Nonexudative age-related macular degeneration, bilateral, early dry stage: Secondary | ICD-10-CM | POA: Diagnosis not present

## 2017-05-21 DIAGNOSIS — H2513 Age-related nuclear cataract, bilateral: Secondary | ICD-10-CM | POA: Diagnosis not present

## 2017-05-21 DIAGNOSIS — H40011 Open angle with borderline findings, low risk, right eye: Secondary | ICD-10-CM | POA: Diagnosis not present

## 2017-05-25 DIAGNOSIS — L89313 Pressure ulcer of right buttock, stage 3: Secondary | ICD-10-CM | POA: Diagnosis not present

## 2017-05-25 DIAGNOSIS — M86159 Other acute osteomyelitis, unspecified femur: Secondary | ICD-10-CM | POA: Diagnosis not present

## 2017-05-25 DIAGNOSIS — G8220 Paraplegia, unspecified: Secondary | ICD-10-CM | POA: Diagnosis not present

## 2017-05-28 ENCOUNTER — Telehealth: Payer: Self-pay | Admitting: *Deleted

## 2017-05-28 NOTE — Telephone Encounter (Signed)
I agree with VO for PT, thank you.

## 2017-05-28 NOTE — Telephone Encounter (Signed)
Call from [place] RN , [facility] - requesting verbal orders "PT/OT eval". Stated pt has new bed, needs transfer teaching and wife wants pt to learn how to transfer to the shower. VO given - if not ok, let me know. Thanks

## 2017-06-04 NOTE — Addendum Note (Signed)
Addended by: Bufford Spikes on: 06/04/2017 09:06 AM   Modules accepted: Orders

## 2017-06-24 DIAGNOSIS — G8220 Paraplegia, unspecified: Secondary | ICD-10-CM | POA: Diagnosis not present

## 2017-06-24 DIAGNOSIS — L89313 Pressure ulcer of right buttock, stage 3: Secondary | ICD-10-CM | POA: Diagnosis not present

## 2017-06-24 DIAGNOSIS — M86159 Other acute osteomyelitis, unspecified femur: Secondary | ICD-10-CM | POA: Diagnosis not present

## 2017-06-25 ENCOUNTER — Ambulatory Visit (INDEPENDENT_AMBULATORY_CARE_PROVIDER_SITE_OTHER): Payer: Medicaid Other | Admitting: Internal Medicine

## 2017-06-25 ENCOUNTER — Encounter (INDEPENDENT_AMBULATORY_CARE_PROVIDER_SITE_OTHER): Payer: Self-pay

## 2017-06-25 ENCOUNTER — Encounter: Payer: Self-pay | Admitting: Internal Medicine

## 2017-06-25 VITALS — BP 164/95 | HR 76 | Temp 98.0°F

## 2017-06-25 DIAGNOSIS — Z79891 Long term (current) use of opiate analgesic: Secondary | ICD-10-CM | POA: Diagnosis not present

## 2017-06-25 DIAGNOSIS — G8220 Paraplegia, unspecified: Secondary | ICD-10-CM

## 2017-06-25 DIAGNOSIS — Z23 Encounter for immunization: Secondary | ICD-10-CM | POA: Diagnosis not present

## 2017-06-25 DIAGNOSIS — D649 Anemia, unspecified: Secondary | ICD-10-CM

## 2017-06-25 DIAGNOSIS — Z0000 Encounter for general adult medical examination without abnormal findings: Secondary | ICD-10-CM

## 2017-06-25 DIAGNOSIS — I10 Essential (primary) hypertension: Secondary | ICD-10-CM

## 2017-06-25 MED ORDER — TRAMADOL HCL 50 MG PO TABS: 50.0000 mg | ORAL_TABLET | Freq: Every day | ORAL | 0 refills | Status: DC | PRN

## 2017-06-25 MED ORDER — AMLODIPINE BESYLATE 10 MG PO TABS
10.0000 mg | ORAL_TABLET | Freq: Every day | ORAL | 11 refills | Status: DC
Start: 2017-06-25 — End: 2017-10-22

## 2017-06-25 MED ORDER — AMLODIPINE BESYLATE 10 MG PO TABS: 10.0000 mg | ORAL_TABLET | Freq: Every day | ORAL | 11 refills | Status: DC

## 2017-06-25 NOTE — Assessment & Plan Note (Signed)
Resulting in lower extremity paralysis and is wheelchair bound. Reports improvement in his ability to transfer and get dressed on his own. His wife assist him with eating and toileting as his bathroom is not handicapped accessible. He requests a new Writer. Is 41 years old and the battery is no longer working, it only lasts for 1 hour after he charges it and this is very inconvenient for his ability to get around outside of the house. - Placed DME order for an electric wheelchair

## 2017-06-25 NOTE — Assessment & Plan Note (Signed)
Received his flu vaccination today

## 2017-06-25 NOTE — Patient Instructions (Addendum)
-today you got your flu shot. I am sending in a request for you to get an electric wheelchair if you do not hear back please call our office for an update - for your side pain I have provided to a 1 month prescription fortramadol, I have left two additional months of prescriptions for you at the front desk, please come to the clinic to request these when you need them -For your high blood pressure Start taking amlodipine 10 mg daily this prescription is $4 at [facility] and you do not need to be a member to pick it up. Continue taking lisinopril 40 mg daily and hydrochlorothiazide 25 mg daily.  - please schedule a follow-up appointment with me in 1-3 months to recheck blood pressure -Please call our office if you have any questions or concerns, we may be able to help you in prevent you from evening to visit the emergency department [phone]   DASH Eating Plan DASH stands for "Dietary Approaches to Stop Hypertension." The DASH eating plan is a healthy eating plan that has been shown to reduce high blood pressure (hypertension). It may also reduce your risk for type 2 diabetes, heart disease, and stroke. The DASH eating plan may also help with weight loss. What are tips for following this plan? General guidelines  Avoid eating more than 2,300 mg (milligrams) of salt (sodium) a day. If you have hypertension, you may need to reduce your sodium intake to 1,500 mg a day.  Limit alcohol intake to no more than 1 drink a day for nonpregnant women and 2 drinks a day for men. One drink equals 12 oz of beer, 5 oz of wine, or 1 oz of hard liquor.  Work with your health care provider to maintain a healthy body weight or to lose weight. Ask what an ideal weight is for you.  Get at least 30 minutes of exercise that causes your heart to beat faster (aerobic exercise) most days of the week. Activities may include walking, swimming, or biking.  Work with your health care provider or diet and nutrition  specialist (dietitian) to adjust your eating plan to your individual calorie needs. Reading food labels  Check food labels for the amount of sodium per serving. Choose foods with less than 5 percent of the Daily Value of sodium. Generally, foods with less than 300 mg of sodium per serving fit into this eating plan.  To find whole grains, look for the word "whole" as the first word in the ingredient list. Shopping  Buy products labeled as "low-sodium" or "no salt added."  Buy fresh foods. Avoid canned foods and premade or frozen meals. Cooking  Avoid adding salt when cooking. Use salt-free seasonings or herbs instead of table salt or sea salt. Check with your health care provider or pharmacist before using salt substitutes.  Do not fry foods. Cook foods using healthy methods such as baking, boiling, grilling, and broiling instead.  Cook with heart-healthy oils, such as olive, canola, soybean, or sunflower oil. Meal planning   Eat a balanced diet that includes: ? 5 or more servings of fruits and vegetables each day. At each meal, try to fill half of your plate with fruits and vegetables. ? Up to 6-8 servings of whole grains each day. ? Less than 6 oz of lean meat, poultry, or fish each day. A 3-oz serving of meat is about the same size as a deck of cards. One egg equals 1 oz. ? 2 servings of low-fat  dairy each day. ? A serving of nuts, seeds, or beans 5 times each week. ? Heart-healthy fats. Healthy fats called Omega-3 fatty acids are found in foods such as flaxseeds and coldwater fish, like sardines, salmon, and mackerel.  Limit how much you eat of the following: ? Canned or prepackaged foods. ? Food that is high in trans fat, such as fried foods. ? Food that is high in saturated fat, such as fatty meat. ? Sweets, desserts, sugary drinks, and other foods with added sugar. ? Full-fat dairy products.  Do not salt foods before eating.  Try to eat at least 2 vegetarian meals each  week.  Eat more home-cooked food and less restaurant, buffet, and fast food.  When eating at a restaurant, ask that your food be prepared with less salt or no salt, if possible. What foods are recommended? The items listed may not be a complete list. Talk with your dietitian about what dietary choices are Short for you. Grains Whole-grain or whole-wheat bread. Whole-grain or whole-wheat pasta. Brown rice. Orpah Cobb. Bulgur. Whole-grain and low-sodium cereals. Pita bread. Low-fat, low-sodium crackers. Whole-wheat flour tortillas. Vegetables Fresh or frozen vegetables (raw, steamed, roasted, or grilled). Low-sodium or reduced-sodium tomato and vegetable juice. Low-sodium or reduced-sodium tomato sauce and tomato paste. Low-sodium or reduced-sodium canned vegetables. Fruits All fresh, dried, or frozen fruit. Canned fruit in natural juice (without added sugar). Meat and other protein foods Skinless chicken or [place]. Ground chicken or [place]. Pork with fat trimmed off. Fish and seafood. Egg whites. Dried beans, peas, or lentils. Unsalted nuts, nut butters, and seeds. Unsalted canned beans. Lean cuts of beef with fat trimmed off. Low-sodium, lean deli meat. Dairy Low-fat (1%) or fat-free (skim) milk. Fat-free, low-fat, or reduced-fat cheeses. Nonfat, low-sodium ricotta or cottage cheese. Low-fat or nonfat yogurt. Low-fat, low-sodium cheese. Fats and oils Soft margarine without trans fats. Vegetable oil. Low-fat, reduced-fat, or light mayonnaise and salad dressings (reduced-sodium). Canola, safflower, olive, soybean, and sunflower oils. Avocado. Seasoning and other foods Herbs. Spices. Seasoning mixes without salt. Unsalted popcorn and pretzels. Fat-free sweets. What foods are not recommended? The items listed may not be a complete list. Talk with your dietitian about what dietary choices are Rickels for you. Grains Baked goods made with fat, such as croissants, muffins, or some breads. Dry pasta  or rice meal packs. Vegetables Creamed or fried vegetables. Vegetables in a cheese sauce. Regular canned vegetables (not low-sodium or reduced-sodium). Regular canned tomato sauce and paste (not low-sodium or reduced-sodium). Regular tomato and vegetable juice (not low-sodium or reduced-sodium). Rosita Fire. Olives. Fruits Canned fruit in a light or heavy syrup. Fried fruit. Fruit in cream or butter sauce. Meat and other protein foods Fatty cuts of meat. Ribs. Fried meat. Tomasa Blase. Sausage. Bologna and other processed lunch meats. Salami. Fatback. Hotdogs. Bratwurst. Salted nuts and seeds. Canned beans with added salt. Canned or smoked fish. Whole eggs or egg yolks. Chicken or [place] with skin. Dairy Whole or 2% milk, cream, and half-and-half. Whole or full-fat cream cheese. Whole-fat or sweetened yogurt. Full-fat cheese. Nondairy creamers. Whipped toppings. Processed cheese and cheese spreads. Fats and oils Butter. Stick margarine. Lard. Shortening. Ghee. Bacon fat. Tropical oils, such as coconut, palm kernel, or palm oil. Seasoning and other foods Salted popcorn and pretzels. Onion salt, garlic salt, seasoned salt, table salt, and sea salt. Worcestershire sauce. Tartar sauce. Barbecue sauce. Teriyaki sauce. Soy sauce, including reduced-sodium. Steak sauce. Canned and packaged gravies. Fish sauce. Oyster sauce. Cocktail sauce. Horseradish that you find  on the shelf. Ketchup. Mustard. Meat flavorings and tenderizers. Bouillon cubes. Hot sauce and Tabasco sauce. Premade or packaged marinades. Premade or packaged taco seasonings. Relishes. Regular salad dressings. Where to find more information:  National Heart, Lung, and Blood Institute: PopSteam.is  American Heart Association: www.heart.org Summary  The DASH eating plan is a healthy eating plan that has been shown to reduce high blood pressure (hypertension). It may also reduce your risk for type 2 diabetes, heart disease, and stroke.  With the  DASH eating plan, you should limit salt (sodium) intake to 2,300 mg a day. If you have hypertension, you may need to reduce your sodium intake to 1,500 mg a day.  When on the DASH eating plan, aim to eat more fresh fruits and vegetables, whole grains, lean proteins, low-fat dairy, and heart-healthy fats.  Work with your health care provider or diet and nutrition specialist (dietitian) to adjust your eating plan to your individual calorie needs. This information is not intended to replace advice given to you by your health care provider. Make sure you discuss any questions you have with your health care provider. Document Released: 08/30/2011 Document Revised: 09/03/2016 Document Reviewed: 09/03/2016 Elsevier Interactive Patient Education  2017 [employer].

## 2017-06-25 NOTE — Assessment & Plan Note (Signed)
Cameron Zavala is on chronic opioid therapy for chronic pain. We were not able to complete a controlled substance contract today, because we did not have one available in spanish however the contract will be translated to spanish and we spoke through it in english and will complete one at his next visit. The [place] controlled substance database was not available for check today as the website has changed however I will check this when I have access.   This is a new contract, the patient was previously receiving intermittent prescriptions so UDS is not yet appropriate. Patient needs at least a yearly UDS.   The patient is on tramadol (Ultram) strength 50, 20 per 30 days. Adjunctive treatment includes NSAID's. This regimen allows Cameron Zavala to function and does not cause excessive sedation or other side effects.  "The benefits of continuing opioid therapy outweigh the risks and chronic opioids will be continued. Ongoing education about safe opioid treatment is provided  Interventions today include: Refills - 3 paper Rx printed, two will be left at the front desk
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
## 2017-06-25 NOTE — Assessment & Plan Note (Signed)
BP Readings from Last 3 Encounters:  06/25/17 (!) 164/95  05/09/17 (!) 164/86  05/01/17 (!) 150/79  Goal < 130/80. Denies headaches, blurry vision or orthostasis. BMP in July unremarkable for electrolyte or kidney function abnormalities. Lisinopril was increased at last visit in august, however blood pressure remains uncontrolled.  - continue lisinopril 40 mg qd and HCTZ 25 mg qd  - start amlodipine 10 mg daily, this is a $4 prescription at [facility] however he has his prescriptions paid for through [facility] so I have sent there

## 2017-06-25 NOTE — Assessment & Plan Note (Signed)
Denies fatigue, weakness, headaches, or shortness of breath. Last Hgb was 14.3, normocytic with increased RDW, her prior to this his hemoglobin was consistently around 10. He denies any signs of external bleeding touch as dark or bloody stools, hemoptysis, or cutaneous. He reports his diet has been improved since he's been living in a SNF. No prior iron studies in this system.  - F/u CBC, ferritin and iron today, will start iron supplementation if he is found to have iron deficiency anemia

## 2017-06-26 LAB — CBC
HEMOGLOBIN: 15.6 g/dL (ref 13.0–17.7)
Hematocrit: 46.5 % (ref 37.5–51.0)
MCH: 27.9 pg (ref 26.6–33.0)
MCHC: 33.5 g/dL (ref 31.5–35.7)
MCV: 83 fL (ref 79–97)
PLATELETS: 195 10*3/uL (ref 150–379)
RBC: 5.6 x10E6/uL (ref 4.14–5.80)
RDW: 16.7 % — ABNORMAL HIGH (ref 12.3–15.4)
WBC: 5.1 10*3/uL (ref 3.4–10.8)

## 2017-06-26 LAB — FERRITIN: Ferritin: 25 ng/mL — ABNORMAL LOW (ref 30–400)

## 2017-06-26 LAB — IRON AND TIBC
IRON SATURATION: 25 % (ref 15–55)
IRON: 89 ug/dL (ref 38–169)
TIBC: 355 ug/dL (ref 250–450)
UIBC: 266 ug/dL (ref 111–343)

## 2017-06-26 NOTE — Progress Notes (Signed)
Internal Medicine Clinic Attending  Case discussed with Dr. Guilloud at the time of the visit.  We reviewed the resident's history and exam and pertinent patient test results.  I agree with the assessment, diagnosis, and plan of care documented in the resident's note.  

## 2017-06-27 ENCOUNTER — Telehealth: Payer: Self-pay

## 2017-06-27 NOTE — Telephone Encounter (Signed)
Faxed CMN form for [facility] 06/26/2017.

## 2017-07-12 ENCOUNTER — Telehealth: Payer: Self-pay

## 2017-07-12 NOTE — Telephone Encounter (Signed)
Requesting PA on Tramadol. Would like the nurse to call back today.

## 2017-07-12 NOTE — Telephone Encounter (Signed)
Call made to patient's wife and she was informed that PA request was faxed to [facility] today and she will have to check back with the pharmacy later today or tomorrow to see if request was approved.Kingsley Spittle Cassady10/19/20184:24 PM

## 2017-07-12 NOTE — Telephone Encounter (Signed)
rtc to pt, spoke w/ wife, called pharm and they will fax the PA form

## 2017-07-17 NOTE — Telephone Encounter (Addendum)
Call to [facility].  More information need on PA  For Tramadol.   Completed needed information refaxed to [facility].  Awaiting decision.  Angelina Ok, RN 07/17/2017 10:51 AM. Call to [facility] Tramadol approved 07/17/2017 thru 01/15/2017 .  Angelina Ok, RN 07/19/2017 11:48 AM.

## 2017-07-23 ENCOUNTER — Ambulatory Visit (INDEPENDENT_AMBULATORY_CARE_PROVIDER_SITE_OTHER): Payer: Medicaid Other | Admitting: Internal Medicine

## 2017-07-23 ENCOUNTER — Encounter: Payer: Self-pay | Admitting: Internal Medicine

## 2017-07-23 DIAGNOSIS — Z026 Encounter for examination for insurance purposes: Secondary | ICD-10-CM

## 2017-07-23 DIAGNOSIS — E441 Mild protein-calorie malnutrition: Secondary | ICD-10-CM

## 2017-07-23 DIAGNOSIS — G8220 Paraplegia, unspecified: Secondary | ICD-10-CM | POA: Diagnosis not present

## 2017-07-23 DIAGNOSIS — E46 Unspecified protein-calorie malnutrition: Secondary | ICD-10-CM | POA: Diagnosis not present

## 2017-07-23 DIAGNOSIS — I10 Essential (primary) hypertension: Secondary | ICD-10-CM | POA: Diagnosis not present

## 2017-07-23 DIAGNOSIS — E7800 Pure hypercholesterolemia, unspecified: Secondary | ICD-10-CM

## 2017-07-23 DIAGNOSIS — Z87891 Personal history of nicotine dependence: Secondary | ICD-10-CM

## 2017-07-23 DIAGNOSIS — N319 Neuromuscular dysfunction of bladder, unspecified: Secondary | ICD-10-CM

## 2017-07-23 DIAGNOSIS — L8990 Pressure ulcer of unspecified site, unspecified stage: Secondary | ICD-10-CM

## 2017-07-23 DIAGNOSIS — Z79899 Other long term (current) drug therapy: Secondary | ICD-10-CM

## 2017-07-23 DIAGNOSIS — H938X3 Other specified disorders of ear, bilateral: Secondary | ICD-10-CM | POA: Diagnosis not present

## 2017-07-23 DIAGNOSIS — E785 Hyperlipidemia, unspecified: Secondary | ICD-10-CM

## 2017-07-23 DIAGNOSIS — S24104S Unspecified injury at T11-T12 level of thoracic spinal cord, sequela: Secondary | ICD-10-CM

## 2017-07-23 DIAGNOSIS — Z933 Colostomy status: Secondary | ICD-10-CM | POA: Diagnosis not present

## 2017-07-23 DIAGNOSIS — Z993 Dependence on wheelchair: Secondary | ICD-10-CM | POA: Diagnosis not present

## 2017-07-23 DIAGNOSIS — M8668 Other chronic osteomyelitis, other site: Secondary | ICD-10-CM | POA: Diagnosis not present

## 2017-07-23 DIAGNOSIS — J3489 Other specified disorders of nose and nasal sinuses: Secondary | ICD-10-CM | POA: Diagnosis not present

## 2017-07-23 DIAGNOSIS — H6122 Impacted cerumen, left ear: Secondary | ICD-10-CM

## 2017-07-23 DIAGNOSIS — F329 Major depressive disorder, single episode, unspecified: Secondary | ICD-10-CM

## 2017-07-23 DIAGNOSIS — D649 Anemia, unspecified: Secondary | ICD-10-CM | POA: Diagnosis not present

## 2017-07-23 DIAGNOSIS — H938X1 Other specified disorders of right ear: Secondary | ICD-10-CM

## 2017-07-23 NOTE — Assessment & Plan Note (Signed)
Patient reports sensation of fullness and decreased hearing in only his right ear for the past week which became accompanied by runny nose and soar throat three days ago. He denies fever or chills and has no pain with manipulation of the ear or palpation over the mastoid air cells. He does have tenderness of the left ear canal with otoscope exam and erythema of b/l distal ear canals. Although he has symptoms in the right ear, his left ear is more painful on my exam today, this is suggestive of bilateral ear involvement and given his symptoms of URI likely represents viral otitis.   -  monitor for worsening of symptoms, he will call the clinic if thing have now improved 3 weeks from now

## 2017-07-23 NOTE — Assessment & Plan Note (Signed)
At last visit cholesterol screening revealed total cholesterol 192 and LDL 132. He has been on statin therapy in the past but began feeling itchy while taking this medication so it was stopped, he does not remember myopathy. ASCVD 10 year risk is 2.3%, we weighed the risk and benefits of starting a statin and have come to a joined decision to continue with repeat screening in 5 years or reassessment if he were to develop a comorbid condition.  - Repeat Lipid screening in 5 years.
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
## 2017-07-23 NOTE — Assessment & Plan Note (Signed)
BP Readings from Last 3 Encounters:  07/23/17 (!) 143/78  06/25/17 (!) 164/95  05/09/17 (!) 164/86  Blood pressure is significantly improved since beginning amlodipine at last visit. He has not had peripheral edema or worsening of constipation with this medication. His blood pressure is not at goal of <130/80 at this point but I question if implementing better compliance with low salt diet may get him to a controlled level and avoid starting a fourth medication.  - continue amlodipine 10 mg qd, lisinopril 40 mg qd, and HCTZ 25 mg qd  - Referral to dietary for hypertension and protein calorie malnutrition

## 2017-07-23 NOTE — Assessment & Plan Note (Signed)
Weight trend is is difficult to completely assess as the patient is wheelchair bound however he does have a muscle wasting and a persistently low albumin suggesting insufficient energy intake are consistent with a diagnosis of protein calorie malnutrition.  - Referral to dietitian

## 2017-07-23 NOTE — Patient Instructions (Addendum)
It was a pleasure to see you today [name]   - For your high blood pressure, try to stick with a lower salt diet, I have listed the recommendations below  - For your ear fullness, I think this is related to the cold that has developed, when I examined your ear today there was no sign of dangerous hearing loss. Continue to monitor your symptoms and call the clinic and let me know if they have not improved within the next 3 weeks.  - The lab test for your nutrition status is low and I would like this to get better for you so I will send you to a nutrition instructor for some recommendations on how to make this better.   - Please call our clinic if you have any problems or questions, we may be able to help you and keep you from a long emergency room wait. Our clinic and after hours phone number is [phone]   Plan de alimentacin DASH (DASH Eating Plan) DASH es la sigla en ingls de "Enfoques Alimentarios para Detener la Hipertensin". El plan de alimentacin DASH ha demostrado bajar la presin arterial elevada (hipertensin). Los beneficios adicionales para la salud pueden incluir la disminucin del riesgo de diabetes mellitus tipo2, enfermedades cardacas e ictus. Este plan tambin puede ayudar a Geophysical data processoradelgazar. QU DEBO SABER ACERCA DEL PLAN DE ALIMENTACIN DASH? Para el plan de alimentacin DASH, seguir las siguientes pautas generales:  Elija los alimentos que contienen menos de 150 miligramos de sodio por porcin (segn se indica en la etiqueta de los alimentos).  Use hierbas o aderezos sin sal, en lugar de sal de mesa o sal marina.  Consulte al mdico o farmacutico antes de usar sustitutos de la sal.  Consuma los productos con menor contenido de sodio. Estos productos suelen estar etiquetados como "bajo en sodio" o "sin agregado de sal".  Coma alimentos frescos. No consuma una gran cantidad de alimentos enlatados.  Coma ms verduras, frutas y productos lcteos con bajo contenido  de [place].  Elija los cereales integrales. Busque la palabra "integral" en Estate agentel primer lugar de la lista de ingredientes.  Elija el pescado y el pollo o el pavo sin piel ms a menudo que las carnes rojas. Limite el consumo de pescado, carne de ave y carne a 6onzas (170g) por Futures traderda.  Limite el consumo de dulces, postres, azcares y bebidas azucaradas.  Elija las grasas saludables para el corazn.  Consuma ms comida casera y menos de restaurante, de buf y comida rpida.  Limite el consumo de alimentos fritos.  No fra los alimentos. A la hora de cocinarlos, opte por hornearlos, hervirlos, grillarlos y asarlos a Patent attorneyla parrilla.  Cuando coma en un restaurante, pida que preparen su comida con menos sal o, en lo posible, sin nada de sal. QU ALIMENTOS PUEDO COMER? Pida ayuda a un nutricionista para conocer las necesidades calricas individuales. Cereales Pan de salvado o integral. Arroz integral. Pastas de salvado o integrales. Quinua, trigo burgol y cereales integrales. Cereales con bajo contenido de sodio. Tortillas de harina de maz o de salvado. Pan de maz integral. Galletas saladas integrales. Galletas con bajo contenido de [place]. Vegetales Verduras frescas o congeladas (crudas, al vapor, asadas o grilladas). Jugos de tomate y verduras con contenido bajo o reducido de sodio. Pasta y salsa de tomate con contenido bajo o reducido de sodio. Verduras enlatadas con bajo contenido de sodio o reducido de sodio. Nils PyleFrutas Nils PyleFrutas frescas, en conserva (en su jugo natural) o frutas congeladas.  Carnes y otros productos con protenas Carne de res molida (al 85% o ms [place]), carne de res de animales alimentados con pastos o carne de res sin la grasa. Pollo o pavo sin piel. Carne de pollo o de [place]. Cerdo sin la grasa. Todos los pescados y frutos de mar. Huevos. Porotos, guisantes o lentejas secos. Frutos secos y semillas sin sal. Frijoles enlatados sin sal. Lcteos Productos lcteos con bajo  contenido de grasas, como [place] o al 1%, quesos reducidos en grasas o al 2%, ricota con bajo contenido de grasas o [employer], o yogur natural con bajo contenido de [place]. Quesos con contenido bajo o reducido de sodio. Grasas y Writer en barra que no contengan grasas trans. [place] y alios para ensaladas livianos o reducidos en grasas (reducidos en sodio). [place]. Aceites de crtamo, oliva o canola. Mantequilla natural de man o almendra. Otros Palomitas de maz y pretzels sin sal. Los artculos mencionados arriba pueden no ser [employer] de las bebidas o los alimentos recomendados. Comunquese con el nutricionista para conocer ms opciones. QU ALIMENTOS NO SE RECOMIENDAN? Cereales Pan blanco. Pastas blancas. Arroz blanco. Pan de maz refinado. Bagels y croissants. Galletas saladas que contengan grasas trans. Vegetales Vegetales con crema o fritos. Verduras en salsa de [place]. Verduras enlatadas comunes. Pasta y salsa de tomate en lata comunes. Jugos comunes de tomate y de verduras. Nils Pyle Fruta enlatada en almbar liviano o espeso. Jugo de frutas. Carnes y otros productos con protenas [place] de carne con Holiday representative. Costillas, alas de pollo, tocineta, salchicha, mortadela, salame, chinchulines, tocino, perros calientes, salchichas alemanas y embutidos envasados. Frutos secos y semillas con sal. Frijoles con sal en lata. Lcteos Leche entera o al 2%, crema, mezcla de [place] y crema, y queso crema. Yogur entero o endulzado. Quesos o queso azul con alto contenido de Neurosurgeon. Cremas no lcteas y coberturas batidas. Quesos procesados, quesos para untar o cuajadas. Condimentos Sal de cebolla y ajo, sal condimentada, sal de mesa y sal marina. Salsas en lata y envasadas. Salsa Worcestershire. Salsa trtara. Salsa barbacoa. Salsa teriyaki. Salsa de soja, incluso la que tiene contenido reducido de [place]. Salsa de carne. Salsa de pescado. Salsa de [place]. Salsa rosada.  Rbano picante. Ketchup y mostaza. Saborizantes y tiernizantes para carne. Caldo en cubitos. Salsa picante. Salsa tabasco. Adobos. Aderezos para tacos. Salsas. Grasas y [address], [place] en barra, [place] de [place], [place], [place] clarificada y Steffanie [place] de tocino. Aceites de coco, de palmiste o de palma. Aderezos comunes para ensalada. Otros Pickles y [place]. Palomitas de maz y pretzels con sal. Los artculos mencionados arriba pueden no ser [employer] de las bebidas y los alimentos que se Theatre stage manager. Comunquese con el nutricionista para obtener ms informacin. DNDE Raelyn Mora MS INFORMACIN? Instituto Nacional del [place], del Pulmn y de Risk manager (National Heart, Lung, and Blood Institute): [email or web address] Esta informacin no tiene Theme park manager el consejo del mdico. Asegrese de hacerle al mdico cualquier pregunta que tenga. Document Released: 08/30/2011 Document Revised: 01/02/2016 Document Reviewed: 07/15/2013 Elsevier Interactive Patient Education  2017 [employer]. .

## 2017-07-25 DIAGNOSIS — M86159 Other acute osteomyelitis, unspecified femur: Secondary | ICD-10-CM | POA: Diagnosis not present

## 2017-07-25 DIAGNOSIS — G8220 Paraplegia, unspecified: Secondary | ICD-10-CM | POA: Diagnosis not present

## 2017-07-25 DIAGNOSIS — L89313 Pressure ulcer of right buttock, stage 3: Secondary | ICD-10-CM | POA: Diagnosis not present

## 2017-07-25 NOTE — Progress Notes (Signed)
Internal Medicine Clinic Attending  Case discussed with Dr. Blum at the time of the visit.  We reviewed the resident's history and exam and pertinent patient test results.  I agree with the assessment, diagnosis, and plan of care documented in the resident's note. 

## 2017-07-31 ENCOUNTER — Encounter: Payer: Medicaid Other | Admitting: Dietician

## 2017-08-08 ENCOUNTER — Ambulatory Visit: Payer: Medicaid Other | Admitting: Dietician

## 2017-08-08 ENCOUNTER — Encounter (INDEPENDENT_AMBULATORY_CARE_PROVIDER_SITE_OTHER): Payer: Self-pay

## 2017-08-08 DIAGNOSIS — E441 Mild protein-calorie malnutrition: Secondary | ICD-10-CM

## 2017-08-08 NOTE — Patient Instructions (Addendum)
Suggest stopping the vitamin A pills.   Try to eat at least litle bit of food for breakfast with your ensure-  fruit or bran flakes or old fashioned oatmeal  Happy Thanksgiving!  The following recommendations are for all of [place]....  Eat more... 1. Whole grains: whole wheat cereal, crackers, bread, pasta, old fashioned oats & brown rice 2. Whole fruits-1 cup a day 3. Vegetables- 2-3 cups a day 4. Lowfat Protein: Chicken, [place], lean cuts of beef and pork, fish 2-3 times a week  Eat Less... 1. Red meat, high fat dairy like milk, ice cream, coffee creamer, snack foods like chips, pork rind, fried foods 2. Added Sugar: artifical sweeteners can help 3. Sodium:Use spices instead of salt, avoid salty foods like soup, crackers, chips, lunch meat, sausage, hot dogs  Avoid.... . High-fructose corn syrup  . Partially hydrogenated vegetable oils . Trans fatty acids . Powdered Nondairy coffee creamer Of course, too much of almost any food.

## 2017-08-08 NOTE — Progress Notes (Signed)
  Medical Nutrition Therapy:  Appt start time: 1500 end time:  1600. Visit # 1  Assessment:  Primary concerns today: mild protein calorie Malnutrition  Diagnosed while in the hospital with a sacral decubitus, then spent 3 moths in a rehab.  He is paraplegic and spends most of his time in bed per his wife. Only gets up for dinner for about 3 hours. He denies depression and did not have celexa in his medicine bag today. Both patient and his wife (she is blind) shop and cook for food. They get 68$ of food stamps a month and supplement that with food pantries and her sister sends them ensure enough for him to have one daily. His wife mentioned that she wonders if he doesn't eat more because of their limited food resources. Mr. Rosalia Hammers feels he is eating enough and much better than he was while in rehab and the hospital. He has a colostomy and denies constipation although his wife says they just got a fiber supplement. They report that his wound is completley healed and there are no signs of other skin breakdown.  Last blood pressure was elevated despite 3 medications for same.    Preferred Learning Style: No preference indicated  Learning Readiness: Contemplating  ANTHROPOMETRICS: weight- think he is ~ 140# now, height-64", BMI--, waist circumference is 38.75" sitting WEIGHT HISTORY: Highest: 155# Lowest- 134# SLEEP:too well- in bed most of time Labs reviewed and appear improved.  MEDICATIONS: all on his medicine list except does not take Vitamin C DIETARY INTAKE: Usual eating pattern includes 1 meals and 2 snacks per day.Everyday foods include milk, beans, vegetables Avoided foods .  Food Intolerances: many from which he says he gets a rash around his mouth, some he still eats Dining Out (times/week): seldom, sometimes on weekends 24-hr recall:  B ( AM): ensure plus Snacks- fruit or juice- will eat 2-3 [ieces of fruit if they have them D ( PM): 2-3 cups vegetables, protein and starch  containing stew, 6-7 corn tortillas, 1 lage glass whole milk Beverages: water, 1 glass of whole milk/day, juice, little beer, V8   Usual physical activity: little other than ADLs, he did not respond when asked about readiness to change  Nutrition Focused Physical Exam 1. Below Eye fat- adequate,  2. Temple muscle- adequate,  4-7. Clavicle muscle- Shoulder muscle. Triceps/Biceps muscle  Scapula muscle-  - not visualized today 8. Hand muscle- adequate,   9. Thoracic and Lumbar Fat- adequate,  10. Sacral Area edema- absent  Estimated energy needs:1600-1800 calories, 75-80 g protein/day  Progress Towards Goal(s):  Some progress.   Nutritional Diagnosis:  NI-5.2 Mild protein calorie Malnutrition As related to previous loss of weight and increased energy needs for wound healing has resolved  As evidenced by his reported weight gain, good intake and increased energy.    Intervention:  Nutrition education for optimal nutrition to prevent decubitus from reoccurring.  Gave information about resources in the community for stretching their food dollar.  Coordination of care: none at this time  Teaching Method Utilized: visual,Auditory,Hands on Handouts given during visit include: food pantries, avs Barriers to learning/adherence to lifestyle change: patient's willingness to comply Demonstrated degree of understanding via:  Teach Back   Monitoring/Evaluation:  Dietary intake, exercise, and body weight in 4 week(s)  Plyler, Lupita Leash, RD CDE 08/08/2017 5:38 PM. .

## 2017-08-23 ENCOUNTER — Other Ambulatory Visit: Payer: Self-pay | Admitting: Internal Medicine

## 2017-08-23 NOTE — Telephone Encounter (Signed)
pls make appt w/ pcp for Jan 2019, thanks!

## 2017-08-23 NOTE — Telephone Encounter (Signed)
Refill prescriptions for this are printed and left in the triage office, he can come pick one up each month and will need to schedule a follow up appointment in 3 months for an additional 3 months of prescription.

## 2017-08-23 NOTE — Telephone Encounter (Addendum)
Tramadol called in to [facility] ( #20, 1R)

## 2017-08-24 DIAGNOSIS — L89313 Pressure ulcer of right buttock, stage 3: Secondary | ICD-10-CM | POA: Diagnosis not present

## 2017-08-24 DIAGNOSIS — G8220 Paraplegia, unspecified: Secondary | ICD-10-CM | POA: Diagnosis not present

## 2017-08-24 DIAGNOSIS — M86159 Other acute osteomyelitis, unspecified femur: Secondary | ICD-10-CM | POA: Diagnosis not present

## 2017-08-28 ENCOUNTER — Other Ambulatory Visit: Payer: Self-pay | Admitting: Internal Medicine

## 2017-08-28 NOTE — Telephone Encounter (Signed)
NEEDS REFILL ON PAIN MEDICATION, TO [facility]

## 2017-08-29 NOTE — Telephone Encounter (Signed)
I believe this was called in by [facility] on Friday 11/29

## 2017-09-05 ENCOUNTER — Ambulatory Visit: Payer: Medicaid Other | Admitting: Dietician

## 2017-09-11 ENCOUNTER — Ambulatory Visit: Payer: Medicaid Other | Attending: Internal Medicine | Admitting: Physical Therapy

## 2017-09-11 ENCOUNTER — Encounter: Payer: Self-pay | Admitting: Physical Therapy

## 2017-09-11 DIAGNOSIS — M6281 Muscle weakness (generalized): Secondary | ICD-10-CM | POA: Insufficient documentation

## 2017-09-11 DIAGNOSIS — R29818 Other symptoms and signs involving the nervous system: Secondary | ICD-10-CM
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
## 2017-09-19 IMAGING — CR DG HIP (WITH OR WITHOUT PELVIS) 2-3V*R*
2 series · 2 of 2 positions shown · non-contrast
Comparison: KUB January 11, 2016

CLINICAL DATA: Open sore over the posterior aspect of the right
hip. Patient has fever ; suspicion of osteomyelitis.

EXAM:
DG HIP (WITH OR WITHOUT PELVIS) 2-3V RIGHT

[pelvis ap]
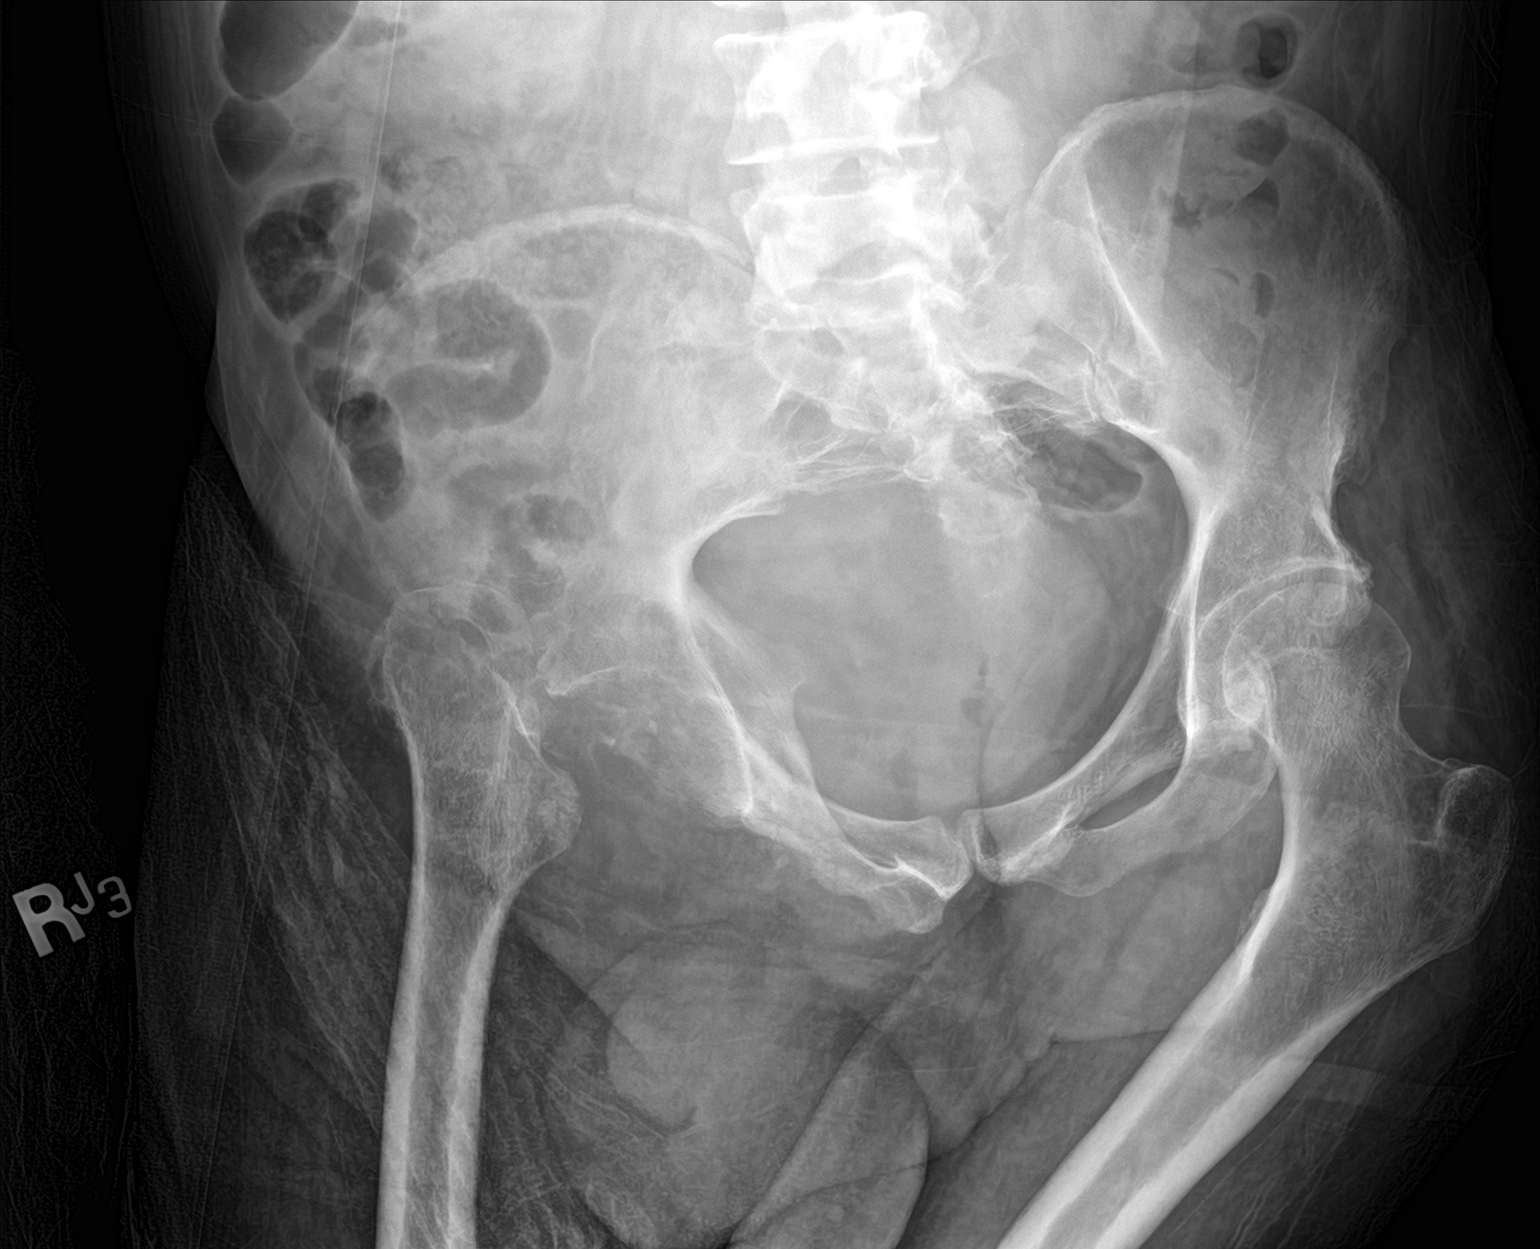

[hip ap]
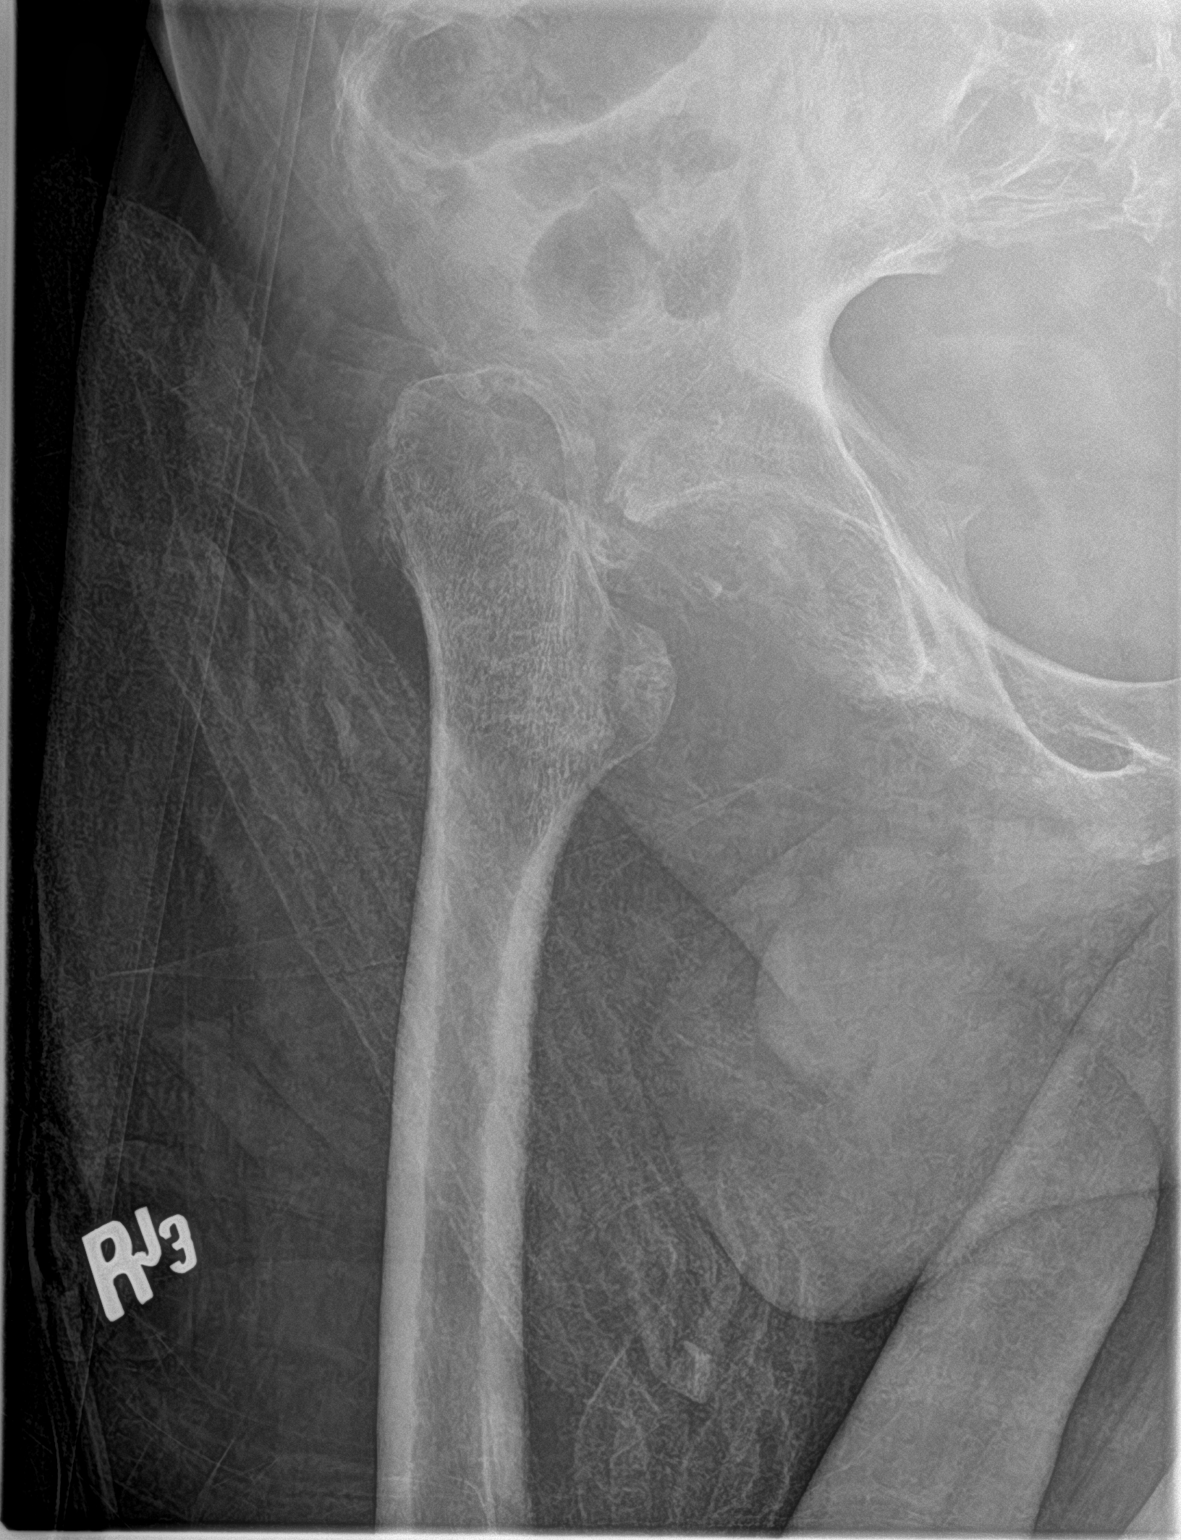

[2 of 2 positions shown; findings below may reference images not displayed]

FINDINGS: Since the previous study there has been resorption of the femoral
head with superior migration of the femur. There are destructive
changes of the acetabulum posteriorly and inferiorly and there is
osteolysis of portions of the inferior pubic ramus.
IMPRESSION: Severe bony changes of the right hip and hemipelvis consistent with
osteomyelitis. There is dislocation of the right hip joint with
resorption of the right femoral head and portions of the acetabulum
and inferior pubic ramus. These findings have developed since [place]

These results will be called to the ordering clinician or
representative by the Radiologist Assistant, and communication
documented in the PACS or zVision Dashboard.

## 2017-09-19 IMAGING — CR DG SACRUM/COCCYX 2+V
4 series · 4 of 4 positions shown · non-contrast
Comparison: CT scan the abdomen pelvis of March 20, 2012

CLINICAL DATA: Patient has an open sore over the posterior aspect
of the right hip. The patient is paraplegic ; suspect osteomyelitis.

EXAM:
SACRUM AND COCCYX - 2+ VIEW

[coccyx ap]
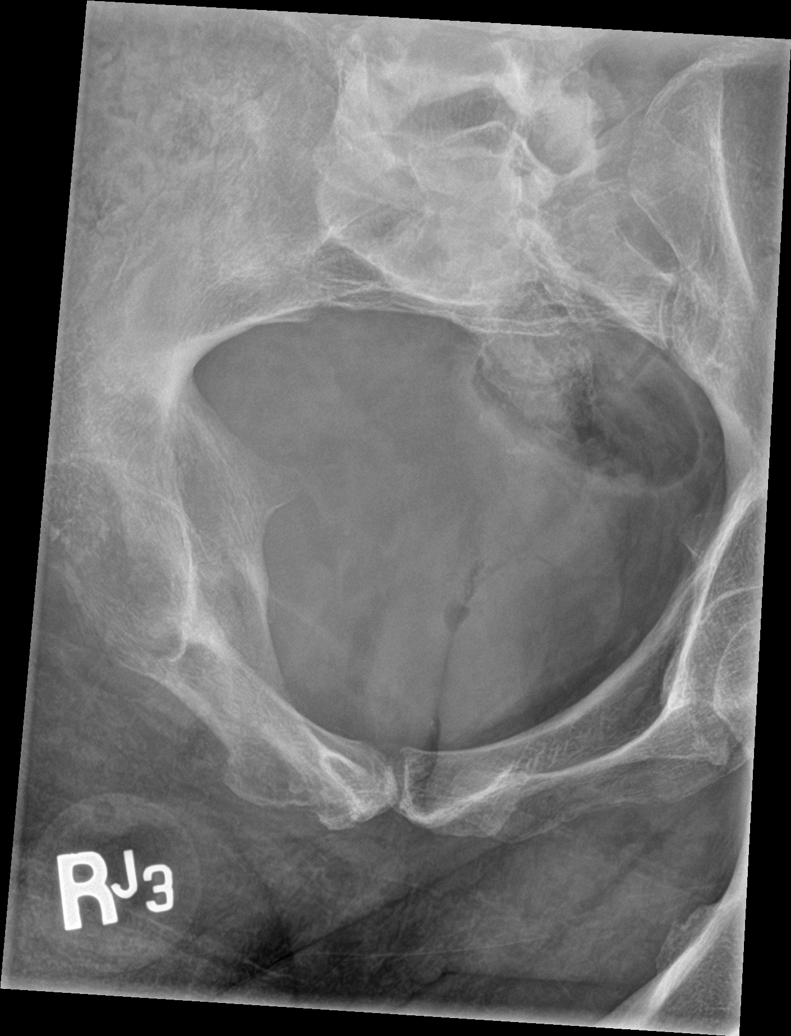

[sacrum ap (1 of 2)]
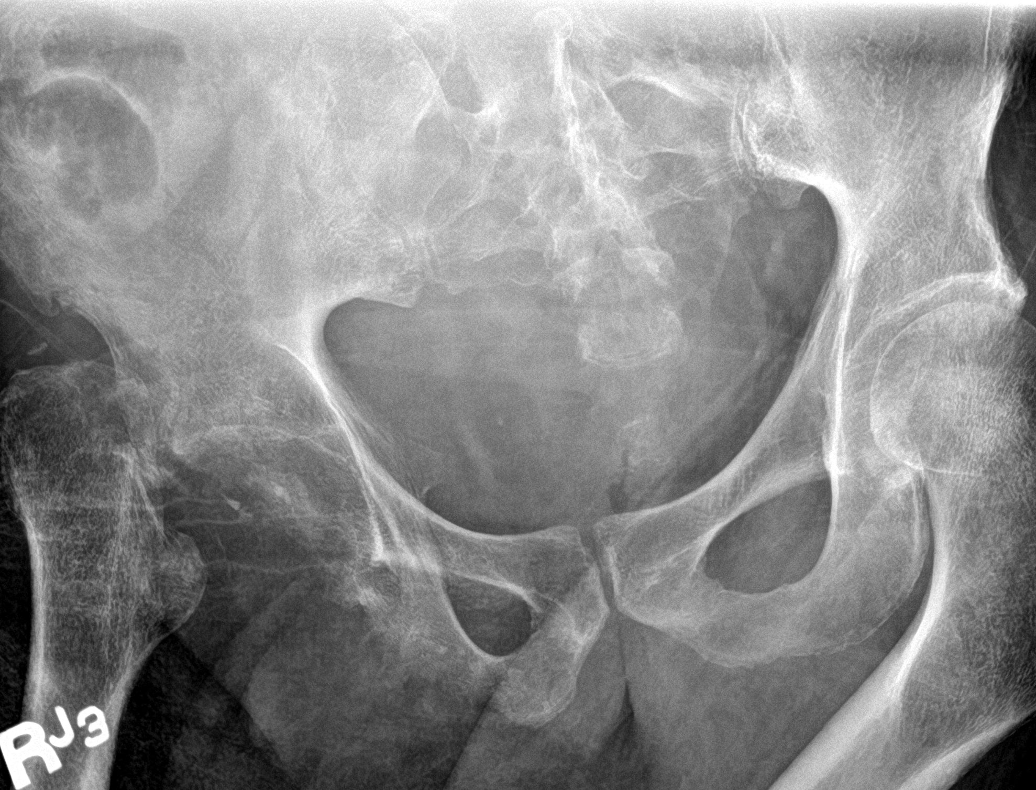

[sacrum lat]
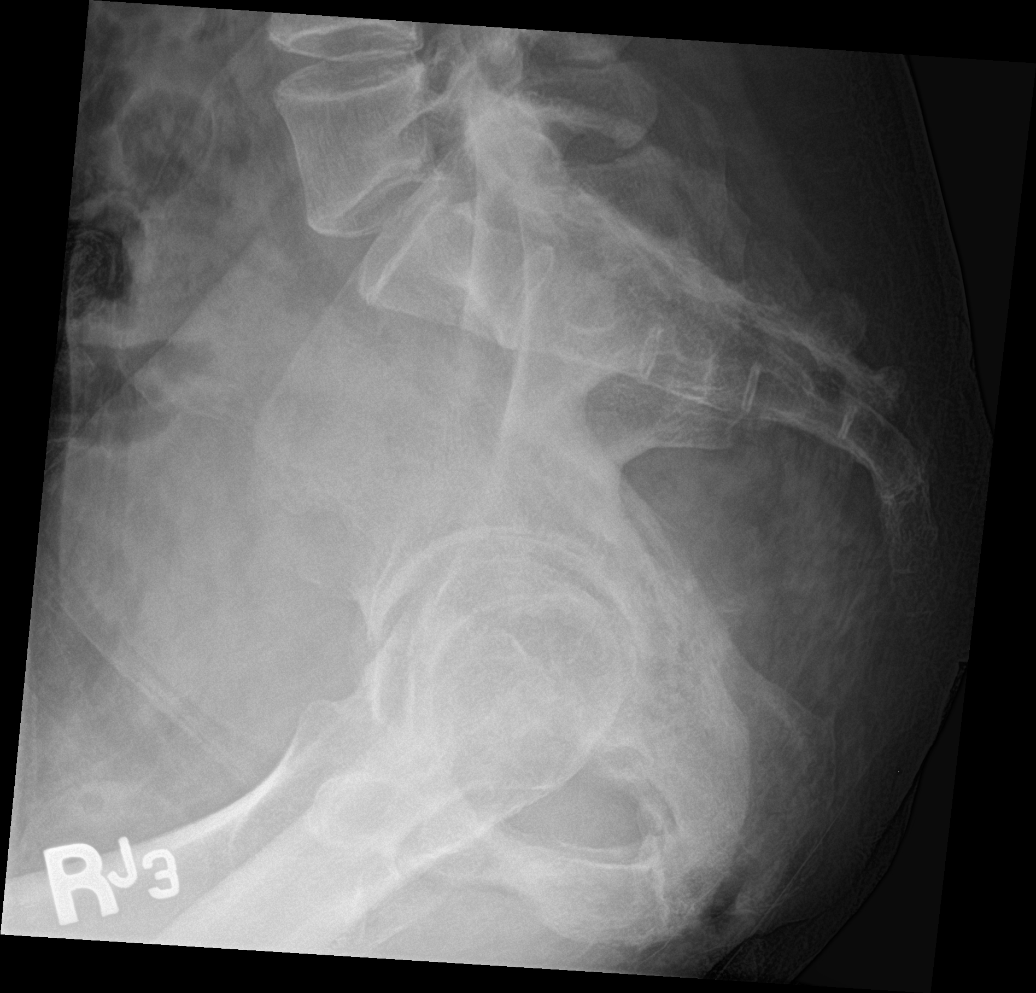

[sacrum ap (2 of 2)]
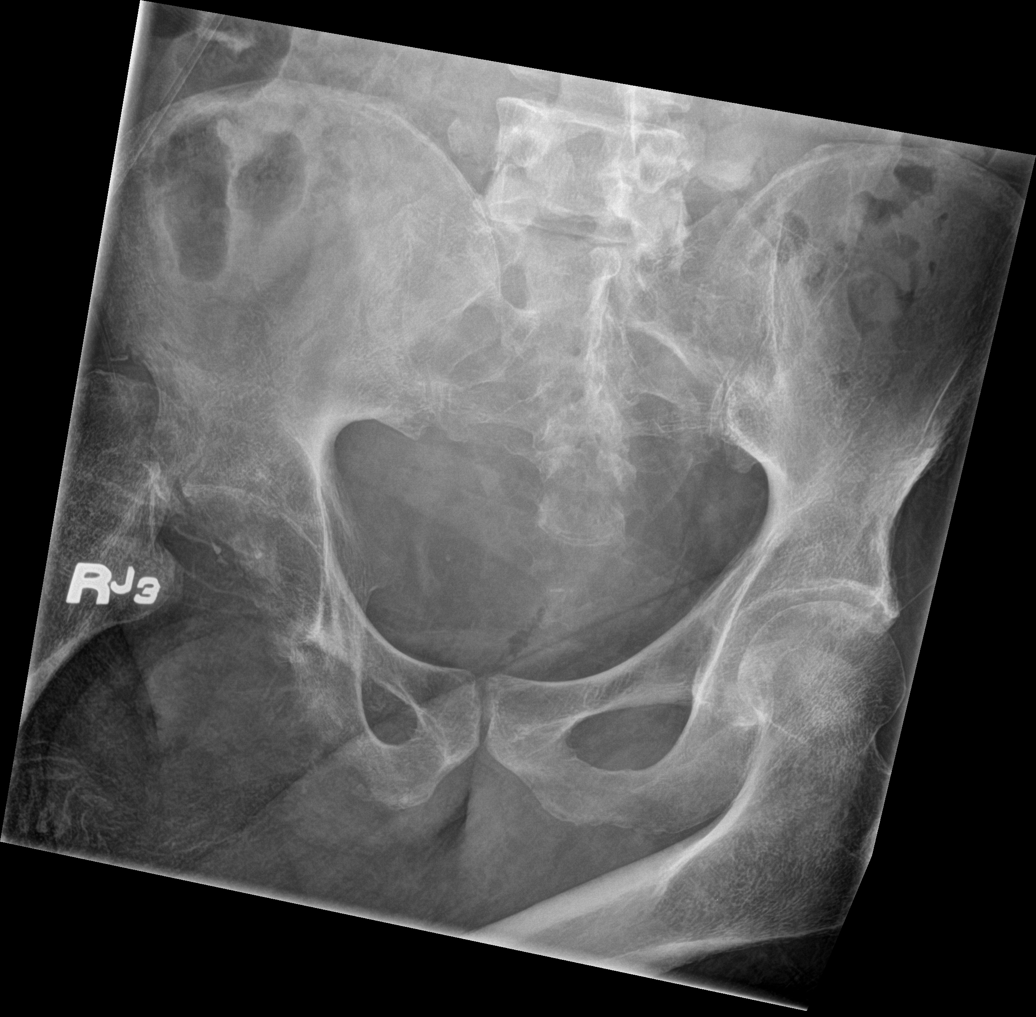

[4 of 4 positions shown; findings below may reference images not displayed]

FINDINGS: The sacrum is subjectively osteopenic. The contour of the sacrum and
coccyx appears normal on the lateral view. No acute fracture or
lytic or blastic lesion is observed.

There is dislocation of the right hip joint with osteolysis of the
humeral head. The bones are diffusely osteopenic. There are
destructive changes of the inferior aspect of the acetabulum and
inferior and lateral aspects of the inferior pubic ramus. The left
hip is grossly normal..
IMPRESSION: 1. No acute abnormality of the sacrum or coccyx.
2. Osteomyelitis involving the right hemipelvis including the
inferior aspect of the acetabulum, inferior pubic ramus, and femoral
head. Chronic dislocation of the right femur with resorption of the
femoral head.

## 2017-09-24 DIAGNOSIS — M86159 Other acute osteomyelitis, unspecified femur: Secondary | ICD-10-CM | POA: Diagnosis not present

## 2017-09-24 DIAGNOSIS — L89313 Pressure ulcer of right buttock, stage 3: Secondary | ICD-10-CM | POA: Diagnosis not present

## 2017-09-24 DIAGNOSIS — G8220 Paraplegia, unspecified: Secondary | ICD-10-CM | POA: Diagnosis not present

## 2017-09-25 ENCOUNTER — Other Ambulatory Visit: Payer: Self-pay | Admitting: Internal Medicine

## 2017-09-25 NOTE — Telephone Encounter (Signed)
Patient wife call requesting refill for patient pain medicine

## 2017-09-26 ENCOUNTER — Other Ambulatory Visit: Payer: Self-pay | Admitting: Internal Medicine

## 2017-10-22 ENCOUNTER — Encounter: Payer: Self-pay | Admitting: Internal Medicine

## 2017-10-22 ENCOUNTER — Ambulatory Visit: Payer: Medicaid Other | Admitting: Internal Medicine

## 2017-10-22 VITALS — BP 144/85 | HR 76 | Temp 98.0°F | Ht 64.0 in

## 2017-10-22 DIAGNOSIS — Z79899 Other long term (current) drug therapy: Secondary | ICD-10-CM

## 2017-10-22 DIAGNOSIS — Z87891 Personal history of nicotine dependence: Secondary | ICD-10-CM

## 2017-10-22 DIAGNOSIS — N319 Neuromuscular dysfunction of bladder, unspecified: Secondary | ICD-10-CM

## 2017-10-22 DIAGNOSIS — Z79891 Long term (current) use of opiate analgesic: Secondary | ICD-10-CM

## 2017-10-22 DIAGNOSIS — I10 Essential (primary) hypertension: Secondary | ICD-10-CM

## 2017-10-22 DIAGNOSIS — Z933 Colostomy status: Secondary | ICD-10-CM

## 2017-10-22 DIAGNOSIS — G8220 Paraplegia, unspecified: Secondary | ICD-10-CM | POA: Diagnosis not present

## 2017-10-22 DIAGNOSIS — G8929 Other chronic pain: Secondary | ICD-10-CM | POA: Diagnosis present

## 2017-10-22 DIAGNOSIS — R109 Unspecified abdominal pain: Secondary | ICD-10-CM

## 2017-10-22 DIAGNOSIS — M545 Low back pain: Secondary | ICD-10-CM | POA: Diagnosis not present

## 2017-10-22 DIAGNOSIS — R10A1 Flank pain, right side: Secondary | ICD-10-CM

## 2017-10-22 DIAGNOSIS — Z872 Personal history of diseases of the skin and subcutaneous tissue: Secondary | ICD-10-CM | POA: Diagnosis not present

## 2017-10-22 DIAGNOSIS — N39498 Other specified urinary incontinence: Secondary | ICD-10-CM

## 2017-10-22 DIAGNOSIS — E785 Hyperlipidemia, unspecified: Secondary | ICD-10-CM | POA: Diagnosis not present

## 2017-10-22 DIAGNOSIS — S24104S Unspecified injury at T11-T12 level of thoracic spinal cord, sequela: Secondary | ICD-10-CM

## 2017-10-22 DIAGNOSIS — W3400XS Accidental discharge from unspecified firearms or gun, sequela: Secondary | ICD-10-CM

## 2017-10-22 MED ORDER — TRAMADOL HCL 50 MG PO TABS: 50.0000 mg | ORAL_TABLET | Freq: Every evening | ORAL | 0 refills | Status: DC | PRN

## 2017-10-22 MED ORDER — GABAPENTIN 100 MG PO CAPS: 100.0000 mg | ORAL_CAPSULE | Freq: Three times a day (TID) | ORAL | 2 refills | Status: DC

## 2017-10-22 MED ORDER — OLMESARTAN-AMLODIPINE-HCTZ 40-10-25 MG PO TABS: 1.0000 | ORAL_TABLET | Freq: Every day | ORAL | 3 refills | Status: DC

## 2017-10-22 MED ORDER — CHLORTHALIDONE 25 MG PO TABS: 25.0000 mg | ORAL_TABLET | Freq: Every day | ORAL | 3 refills | Status: DC

## 2017-10-22 MED ORDER — AMLODIPINE BESYLATE 10 MG PO TABS: 10.0000 mg | ORAL_TABLET | Freq: Every day | ORAL | 11 refills | Status: DC

## 2017-10-22 MED ORDER — LISINOPRIL 40 MG PO TABS: 40.0000 mg | ORAL_TABLET | Freq: Every day | ORAL | 1 refills | Status: DC
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
## 2017-10-22 NOTE — Assessment & Plan Note (Signed)
BP Readings from Last 3 Encounters:  10/22/17 138/71  07/23/17 (!) 143/78  06/25/17 (!) 164/95  Reporting good compliance with medication, he takes them first thing in the morning when he wakes up. He is very close to goal this may be related to the dietary education that we addressed at last visit. I am hopefull that switching to a stronger thiazide diuretic may get him to goal. Last BMP was in 6 months ago, with normal renal function and electrolytes, will repeat today. Also of note, his appetite seems to have improved significantly over the past year, he is gaining weight based on visual exam.  - Repeat BMP today  -discontinue HCTZ and transition to chlorthalidone  - continue amlodipine 10 mg qd and lisinopril 40 mg qd - encouraged continued diet and exercise efforts

## 2017-10-22 NOTE — Assessment & Plan Note (Signed)
Long term opioid analgesic use  Cameron Zavala is on chronic opioid therapy for chronic pain. The date of the controlled substances contract is referenced in the FYI and / or the overview. Date of pain contract was 07/23/2017. As part of the treatment plan, the [place] controlled substance database is checked at least twice yearly and the database results are appropriate. I have last reviewed the results on 10/22/2017.   The patient is on tramadol (Ultram) strength 50 mg as needed to control pain at bedtime, 20 per 30 days. He does mention that he has not been taking this medication per prescription and uses it twice daily at times. This regimen allows Cameron Zavala to function and does not cause excessive sedation or other side effects.  "The benefits of continuing opioid therapy outweigh the risks and chronic opioids will be continued. Ongoing education about safe opioid treatment is provided  Interventions today include: Refills - 3 paper Rx printed - start gabapentin 100 mg TID, there is mention in epic that this has been tried in the past ( prior to 2012) at max dose without relief, I am hopeful that using gabapentin to control his daytime symptoms in addition to the tramadol which is for sleep will help to provide him some relief  - UDS has not yet been performed. Patient has neurogenic bladder and wears adult briefs for this so he is not able to provide a urine sample today. Will need to order a blood drug screen 10 with confirmation at follow up. Patient needs at least a yearly UDS.   

## 2017-10-22 NOTE — Patient Instructions (Addendum)
[name]- Lucius Conn,   Its been a while since you tried the neurontin, lets try this again in combination with the tramadol. Use the tramadol at night to control you pain as needed for sleep. I have sent three prescriptions for tramadol to your pharmacy along with the gabapentin.   Your blood pressure is not quite at goal today, I am going to switch your hydrochlorothiazide to a similar medication with stronger effect called chlorthalidone, continue taking the amlodipine and lisinopril.Try eating a low salt diet and make sure that you keep doing exercises   - Please call our clinic if you have any problems or questions, we may be able to help you and keep you from a long emergency room wait. Our clinic and after hours phone number is [phone]   FOLLOW-UP INSTRUCTIONS When: end of April with Dr. Obie Dredge  For: blood pressure, pain  What to bring: medication bottles
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
## 2017-10-22 NOTE — Assessment & Plan Note (Signed)
Long term opioid analgesic use  [name] is on chronic opioid therapy for chronic pain. The date of the controlled substances contract is referenced in the FYI and / or the overview. Date of pain contract was 07/23/2017. As part of the treatment plan, the [place] controlled substance database is checked at least twice yearly and the database results are appropriate. I have last reviewed the results on 10/22/2017.   The patient is on tramadol (Ultram) strength 50 mg as needed to control pain at bedtime, 20 per 30 days. He does mention that he has not been taking this medication per prescription and uses it twice daily at times. This regimen allows [name] to function and does not cause excessive sedation or other side effects.  "The benefits of continuing opioid therapy outweigh the risks and chronic opioids will be continued. Ongoing education about safe opioid treatment is provided  Interventions today include: Refills - 3 paper Rx printed - start gabapentin 100 mg TID, there is mention in epic that this has been tried in the past ( prior to 2012) at max dose without relief, I am hopeful that using gabapentin to control his daytime symptoms in addition to the tramadol which is for sleep will help to provide him some relief  - UDS has not yet been performed. Patient has neurogenic bladder and wears adult briefs for this so he is not able to provide a urine sample today. Will need to order a blood drug screen 10 with confirmation at follow up. Patient needs at least a yearly UDS.

## 2017-10-23 LAB — BMP8+ANION GAP
ANION GAP: 16 mmol/L (ref 10.0–18.0)
BUN/Creatinine Ratio: 20 (ref 9–20)
BUN: 13 mg/dL (ref 6–24)
CALCIUM: 9.2 mg/dL (ref 8.7–10.2)
CHLORIDE: 103 mmol/L (ref 96–106)
CO2: 24 mmol/L (ref 20–29)
Creatinine, Ser: 0.66 mg/dL — ABNORMAL LOW (ref 0.76–1.27)
GFR calc Af Amer: 139 mL/min/{1.73_m2} (ref >59)
GFR, EST NON AFRICAN AMERICAN: 120 mL/min/{1.73_m2} (ref >59)
GLUCOSE: 89 mg/dL (ref 65–99)
Potassium: 4.4 mmol/L (ref 3.5–5.2)
SODIUM: 143 mmol/L (ref 134–144)

## 2017-10-25 DIAGNOSIS — L89313 Pressure ulcer of right buttock, stage 3: Secondary | ICD-10-CM | POA: Diagnosis not present

## 2017-10-25 DIAGNOSIS — G8220 Paraplegia, unspecified: Secondary | ICD-10-CM | POA: Diagnosis not present

## 2017-10-25 DIAGNOSIS — M86159 Other acute osteomyelitis, unspecified femur: Secondary | ICD-10-CM | POA: Diagnosis not present

## 2017-10-28 NOTE — Progress Notes (Signed)
Internal Medicine Clinic Attending  Case discussed with Dr. Blum at the time of the visit.  We reviewed the resident's history and exam and pertinent patient test results.  I agree with the assessment, diagnosis, and plan of care documented in the resident's note. 

## 2017-11-22 DIAGNOSIS — L89313 Pressure ulcer of right buttock, stage 3: Secondary | ICD-10-CM | POA: Diagnosis not present

## 2017-11-22 DIAGNOSIS — G8220 Paraplegia, unspecified: Secondary | ICD-10-CM | POA: Diagnosis not present

## 2017-11-22 DIAGNOSIS — M86159 Other acute osteomyelitis, unspecified femur: Secondary | ICD-10-CM | POA: Diagnosis not present

## 2017-12-02 ENCOUNTER — Telehealth: Payer: Self-pay | Admitting: Internal Medicine

## 2017-12-02 NOTE — Telephone Encounter (Signed)
Pharmacy is calling needs help trying to figure out what medicine the patient taking for his blood pressure

## 2017-12-03 NOTE — Telephone Encounter (Signed)
Spoke with Pharmacist at [facility]. Confirmed patient taking amlodipine 10 mg, chlorthalidone 25 mg and lisinopril 40 mg daily. HCTZ was d/c on 10/22/2017 by PCP. Kinnie Feil, RN, BSN

## 2017-12-23 DIAGNOSIS — L89313 Pressure ulcer of right buttock, stage 3: Secondary | ICD-10-CM | POA: Diagnosis not present

## 2017-12-23 DIAGNOSIS — G8220 Paraplegia, unspecified: Secondary | ICD-10-CM | POA: Diagnosis not present

## 2017-12-23 DIAGNOSIS — M86159 Other acute osteomyelitis, unspecified femur: Secondary | ICD-10-CM | POA: Diagnosis not present

## 2017-12-24 ENCOUNTER — Other Ambulatory Visit: Payer: Self-pay | Admitting: Internal Medicine

## 2017-12-26 ENCOUNTER — Other Ambulatory Visit: Payer: Self-pay | Admitting: Internal Medicine

## 2017-12-27 NOTE — Telephone Encounter (Signed)
Next appt scheduled  01/21/18 with PCP. 

## 2017-12-31 ENCOUNTER — Other Ambulatory Visit: Payer: Self-pay | Admitting: Internal Medicine

## 2018-01-21 ENCOUNTER — Encounter: Payer: Self-pay | Admitting: Internal Medicine

## 2018-01-21 ENCOUNTER — Ambulatory Visit: Payer: Medicaid Other | Admitting: Internal Medicine

## 2018-01-21 ENCOUNTER — Other Ambulatory Visit: Payer: Self-pay

## 2018-01-21 DIAGNOSIS — E785 Hyperlipidemia, unspecified: Secondary | ICD-10-CM | POA: Diagnosis not present

## 2018-01-21 DIAGNOSIS — Z79891 Long term (current) use of opiate analgesic: Secondary | ICD-10-CM

## 2018-01-21 DIAGNOSIS — Z933 Colostomy status: Secondary | ICD-10-CM | POA: Diagnosis not present

## 2018-01-21 DIAGNOSIS — M545 Low back pain: Secondary | ICD-10-CM

## 2018-01-21 DIAGNOSIS — Z9114 Patient's other noncompliance with medication regimen: Secondary | ICD-10-CM

## 2018-01-21 DIAGNOSIS — S24103S Unspecified injury at T7-T10 level of thoracic spinal cord, sequela: Secondary | ICD-10-CM | POA: Diagnosis not present

## 2018-01-21 DIAGNOSIS — L8990 Pressure ulcer of unspecified site, unspecified stage: Secondary | ICD-10-CM | POA: Diagnosis not present

## 2018-01-21 DIAGNOSIS — M869 Osteomyelitis, unspecified: Secondary | ICD-10-CM | POA: Diagnosis not present

## 2018-01-21 DIAGNOSIS — I10 Essential (primary) hypertension: Secondary | ICD-10-CM | POA: Diagnosis not present

## 2018-01-21 DIAGNOSIS — N319 Neuromuscular dysfunction of bladder, unspecified: Secondary | ICD-10-CM

## 2018-01-21 DIAGNOSIS — G8929 Other chronic pain: Secondary | ICD-10-CM | POA: Diagnosis not present

## 2018-01-21 DIAGNOSIS — G8220 Paraplegia, unspecified: Secondary | ICD-10-CM

## 2018-01-21 DIAGNOSIS — Z79899 Other long term (current) drug therapy: Secondary | ICD-10-CM

## 2018-01-21 DIAGNOSIS — W3400XS Accidental discharge from unspecified firearms or gun, sequela: Secondary | ICD-10-CM

## 2018-01-21 DIAGNOSIS — G629 Polyneuropathy, unspecified: Secondary | ICD-10-CM

## 2018-01-21 MED ORDER — GABAPENTIN 300 MG PO CAPS: 300.0000 mg | ORAL_CAPSULE | Freq: Three times a day (TID) | ORAL | 0 refills | Status: DC

## 2018-01-21 NOTE — Patient Instructions (Signed)
Thank you for coming to the clinic today. It was a pleasure to see you.   For your blood pressure - please start taking amlodipine 10 mg daily, lisinopril 40 mg daily, and chlorthalidone 25 mg daily   For your chronic low back pain,  Start taking gabapentin 300 mg three times daily, I would like to see if this works to control your pain alongside the tramadol  I have refilled 3 month supply of tramadol   FOLLOW-UP INSTRUCTIONS When: 3 months with Dr. Obie Dredge  For: blood pressure and chronic pain check What to bring: all of your medication bottles   Please call our clinic if you have any questions or concerns, we may be able to help and keep you from a long and expensive emergency room wait. Our clinic and after hours phone number is [phone], there is always someone available. If you need medication refills please notify your pharmacy one week in advance and they will send [place] a request.
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
## 2018-01-22 ENCOUNTER — Encounter: Payer: Self-pay | Admitting: Internal Medicine

## 2018-01-22 DIAGNOSIS — G8220 Paraplegia, unspecified: Secondary | ICD-10-CM | POA: Diagnosis not present

## 2018-01-22 DIAGNOSIS — L89313 Pressure ulcer of right buttock, stage 3: Secondary | ICD-10-CM | POA: Diagnosis not present

## 2018-01-22 DIAGNOSIS — M86159 Other acute osteomyelitis, unspecified femur: Secondary | ICD-10-CM | POA: Diagnosis not present

## 2018-01-22 MED ORDER — TRAMADOL HCL 50 MG PO TABS: 50.0000 mg | ORAL_TABLET | Freq: Every evening | ORAL | 2 refills | Status: DC | PRN

## 2018-01-22 NOTE — Progress Notes (Signed)
Internal Medicine Clinic Attending  Case discussed with Dr. Blum at the time of the visit.  We reviewed the resident's history and exam and pertinent patient test results.  I agree with the assessment, diagnosis, and plan of care documented in the resident's note. 

## 2018-01-22 NOTE — Assessment & Plan Note (Signed)
Blood pressure is not controlled today ( 168/98).  At last office visit he was started on amlodipine, lisinopril, and chlorthalidone which was a change from the previous Tribenzor that he had been on.  He has not been taking any of these medications and has had significant weight gain. - Encouraged amlodipine 10 mg daily, lisinopril 40 mg daily, chlorthalidone 25 mg daily -Encouraged weight loss and healthy eating

## 2018-01-22 NOTE — Assessment & Plan Note (Signed)
Cameron Zavala is on chronic opioid therapy for chronic pain. The date of the controlled substances contract is referenced in the FYI and / or the overview. Date of pain contract was 07/23/2017. As part of the treatment plan, the [place] controlled substance database is checked at least twice yearly and the database results are appropriate. I have last reviewed the results today (4/30//2019).   Tramadol dosing is PRN however patient needs at least a yearly UDS.  The patient is on tramadol (Ultram) strength 50, 30 per 30 days. Adjunctive treatment includes none at this time. This regimen allows Cameron Zavala to function and does not cause excessive sedation or other side effects.  "The benefits of continuing opioid therapy outweigh the risks and chronic opioids will be continued. Ongoing education about safe opioid treatment is provided  Interventions today include:   -A trial of gabapentin was prescribed at last office visit, the patient did not pick this up after last office visit but is willing to give this another try - I have sent in a new prescription for gabapentin 300 mg 3 times daily, low threshold to escalate to lyrica if he tries this and it does not help for his pain.  - tramadol Refills - 3 Rx sent to pharmacy

## 2018-02-03 ENCOUNTER — Telehealth: Payer: Self-pay | Admitting: *Deleted

## 2018-02-03 NOTE — Telephone Encounter (Addendum)
Information was faxed to [facility] [employer] for [place] for Tramadol.  Awaiting decision.  Angelina Ok, RN 02/03/2018 4:09 PM  Call to [facility] Tramadol is approved starting 02/04/2018 thru 08/03/2018. [id number].  [employer] was notified of.  Angelina Ok, RN 02/06/2018 9:24 AM

## 2018-02-04 DIAGNOSIS — R32 Unspecified urinary incontinence: Secondary | ICD-10-CM | POA: Diagnosis not present

## 2018-02-04 DIAGNOSIS — L89313 Pressure ulcer of right buttock, stage 3: Secondary | ICD-10-CM | POA: Diagnosis not present

## 2018-02-04 DIAGNOSIS — N319 Neuromuscular dysfunction of bladder, unspecified: Secondary | ICD-10-CM | POA: Diagnosis not present

## 2018-02-04 DIAGNOSIS — M86159 Other acute osteomyelitis, unspecified femur: Secondary | ICD-10-CM | POA: Diagnosis not present

## 2018-02-04 DIAGNOSIS — G8220 Paraplegia, unspecified: Secondary | ICD-10-CM | POA: Diagnosis not present

## 2018-02-04 DIAGNOSIS — S24103D Unspecified injury at T7-T10 level of thoracic spinal cord, subsequent encounter: Secondary | ICD-10-CM | POA: Diagnosis not present

## 2018-02-04 DIAGNOSIS — Z933 Colostomy status: Secondary | ICD-10-CM | POA: Diagnosis not present

## 2018-02-17 ENCOUNTER — Encounter: Payer: Self-pay | Admitting: *Deleted

## 2018-03-25 DIAGNOSIS — Z933 Colostomy status: Secondary | ICD-10-CM | POA: Diagnosis not present

## 2018-03-25 DIAGNOSIS — L89313 Pressure ulcer of right buttock, stage 3: Secondary | ICD-10-CM | POA: Diagnosis not present

## 2018-03-25 DIAGNOSIS — G8220 Paraplegia, unspecified: Secondary | ICD-10-CM | POA: Diagnosis not present

## 2018-03-25 DIAGNOSIS — N319 Neuromuscular dysfunction of bladder, unspecified: Secondary | ICD-10-CM | POA: Diagnosis not present

## 2018-03-25 DIAGNOSIS — M86159 Other acute osteomyelitis, unspecified femur: Secondary | ICD-10-CM | POA: Diagnosis not present

## 2018-03-25 DIAGNOSIS — R32 Unspecified urinary incontinence: Secondary | ICD-10-CM | POA: Diagnosis not present

## 2018-04-11 DIAGNOSIS — L89313 Pressure ulcer of right buttock, stage 3: Secondary | ICD-10-CM | POA: Diagnosis not present

## 2018-04-11 DIAGNOSIS — M86159 Other acute osteomyelitis, unspecified femur: Secondary | ICD-10-CM | POA: Diagnosis not present

## 2018-04-11 DIAGNOSIS — Z933 Colostomy status: Secondary | ICD-10-CM | POA: Diagnosis not present

## 2018-04-11 DIAGNOSIS — G8220 Paraplegia, unspecified: Secondary | ICD-10-CM | POA: Diagnosis not present

## 2018-04-22 ENCOUNTER — Encounter: Payer: Medicaid Other | Admitting: Internal Medicine

## 2018-05-02 ENCOUNTER — Other Ambulatory Visit: Payer: Self-pay | Admitting: Internal Medicine

## 2018-05-06 ENCOUNTER — Encounter: Payer: Self-pay | Admitting: Internal Medicine

## 2018-05-06 ENCOUNTER — Ambulatory Visit: Payer: Medicaid Other | Admitting: Internal Medicine

## 2018-05-06 ENCOUNTER — Encounter (INDEPENDENT_AMBULATORY_CARE_PROVIDER_SITE_OTHER): Payer: Self-pay

## 2018-05-06 ENCOUNTER — Other Ambulatory Visit: Payer: Self-pay

## 2018-05-06 VITALS — BP 132/81 | HR 71 | Temp 98.4°F | Ht 64.0 in

## 2018-05-06 DIAGNOSIS — Z993 Dependence on wheelchair: Secondary | ICD-10-CM | POA: Diagnosis not present

## 2018-05-06 DIAGNOSIS — G8220 Paraplegia, unspecified: Secondary | ICD-10-CM

## 2018-05-06 DIAGNOSIS — I10 Essential (primary) hypertension: Secondary | ICD-10-CM | POA: Diagnosis not present

## 2018-05-06 DIAGNOSIS — S24104S Unspecified injury at T11-T12 level of thoracic spinal cord, sequela: Secondary | ICD-10-CM | POA: Diagnosis not present

## 2018-05-06 DIAGNOSIS — N319 Neuromuscular dysfunction of bladder, unspecified: Secondary | ICD-10-CM | POA: Diagnosis not present

## 2018-05-06 DIAGNOSIS — Z933 Colostomy status: Secondary | ICD-10-CM

## 2018-05-06 DIAGNOSIS — M545 Low back pain: Secondary | ICD-10-CM | POA: Diagnosis not present

## 2018-05-06 DIAGNOSIS — Z79899 Other long term (current) drug therapy: Secondary | ICD-10-CM | POA: Diagnosis not present

## 2018-05-06 DIAGNOSIS — W3400XS Accidental discharge from unspecified firearms or gun, sequela: Secondary | ICD-10-CM

## 2018-05-06 DIAGNOSIS — G8929 Other chronic pain: Secondary | ICD-10-CM | POA: Diagnosis not present

## 2018-05-06 DIAGNOSIS — N39498 Other specified urinary incontinence: Secondary | ICD-10-CM | POA: Diagnosis not present

## 2018-05-06 DIAGNOSIS — Z79891 Long term (current) use of opiate analgesic: Secondary | ICD-10-CM

## 2018-05-06 DIAGNOSIS — E785 Hyperlipidemia, unspecified: Secondary | ICD-10-CM

## 2018-05-06 MED ORDER — CHLORTHALIDONE 50 MG PO TABS: 50.0000 mg | ORAL_TABLET | Freq: Every day | ORAL | 3 refills | Status: DC

## 2018-05-06 MED ORDER — TRAMADOL HCL 50 MG PO TABS: 50.0000 mg | ORAL_TABLET | Freq: Every evening | ORAL | 0 refills | Status: DC | PRN

## 2018-05-06 MED ORDER — PROTECTIVE BARRIER WIPES MISC: 11 refills | Status: AC

## 2018-05-06 NOTE — Assessment & Plan Note (Signed)
BP Readings from Last 3 Encounters:  05/06/18 132/81  01/21/18 (!) 168/98  10/22/17 (!) 144/85  Blood pressure remains uncontrolled today. He did take all his blood pressure medications today.  - amlodipine 10 mg daily, lisinopril 40 mg daily - increase chlorthalidone 50 mg daily Encouraged weight loss and healthy eating

## 2018-05-06 NOTE — Assessment & Plan Note (Signed)
He uses incontinence briefs and needs frequent daily cleaning with barrier wipes.  - order for barrier wipes placed today
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
## 2018-05-06 NOTE — Patient Instructions (Signed)
Thank you for coming to the clinic today. It was a pleasure to see you.   For your high blood pressure, start taking chlorthalidone 50 mg daily. Continue to take the lisinopril and amlodipine every day   For your burning pain - please start taking the gabapentin 300 mg at night and slowly increase to 300 mg three times daily   FOLLOW-UP INSTRUCTIONS When: 3 months with Dr. Obie Dredge  For: Follow up of your chronic lower back pain  What to bring: all of your medication bottles   Please call our clinic if you have any questions or concerns, we may be able to help and keep you from a long and expensive emergency room wait. Our clinic and after hours phone number is [phone], the best time to call is Monday through Friday 9 am to 4 pm but there is always someone available 24/7 if you have an emergency. If you need medication refills please notify your pharmacy one week in advance and they will send [place] a request.

## 2018-05-06 NOTE — Assessment & Plan Note (Signed)
He requires a gel bed to reduce the risk of sacral ulcers.  - DME order placed for gel bed today  - completed SCAT updated application today

## 2018-05-06 NOTE — Assessment & Plan Note (Signed)
Cameron Zavala is on chronic opioid therapy for chronic pain. The date of the controlled substances contract is referenced in the FYI and / or the overview. Date of pain contract was 07/23/2017. As part of the treatment plan, the [place] controlled substance database is checked at least twice yearly and the database results are appropriate. I have last reviewed the results today (8/13//2019).   Tramadol dosing is PRN however patient needs at least a yearly blood drug screen as he has urinary continence due to neurogenic bladder.   The patient is on tramadol (Ultram) strength 50, 20 per 30 days. Adjunctive treatment includes none at this time. This regimen allows Cameron Zavala to function and does not cause excessive sedation or other side effects.  "The benefits of continuing opioid therapy outweigh the risks and chronic opioids will be continued. Ongoing education about safe opioid treatment is provided  Interventions today include:   -A trial of gabapentin was prescribed two office visits , he has started taking this at 100 mg twice daily. He feels that his neuropathic pain remains uncontrolled. We discussed that this gabapentin can be increased up to 300 mg TID and that he should start by taking 300 mg at night and titrate up the dose.  - low threshold to escalate to lyrica if he tries the gabapentin and it does not help for his pain.  - tramadol Refills - 3 Rx sent to pharmacy  - blood toxicology test today

## 2018-05-07 NOTE — Progress Notes (Signed)
Case discussed with Dr. Blum at the time of the visit. We reviewed the resident's history and exam and pertinent patient test results. I agree with the assessment, diagnosis, and plan of care documented in the resident's note. 

## 2018-05-09 LAB — DRUG SCREEN 10 W/CONF, WB
AMPHETAMINES, IA: NEGATIVE ng/mL
BARBITURATES, IA: NEGATIVE ug/mL
Benzodiazepines, IA: NEGATIVE ng/mL
Cocaine/Metabolite, IA: NEGATIVE ng/mL
METHADONE, IA: NEGATIVE ng/mL
OXYCODONES, IA: NEGATIVE ng/mL
Opiates, IA: NEGATIVE ng/mL
PHENCYCLIDINE, IA: NEGATIVE ng/mL
Propoxyphene, IA: NEGATIVE ng/mL
THC (Marijuana) Mtb, IA: NEGATIVE ng/mL

## 2018-05-14 DIAGNOSIS — Z933 Colostomy status: Secondary | ICD-10-CM | POA: Diagnosis not present

## 2018-05-14 DIAGNOSIS — R32 Unspecified urinary incontinence: Secondary | ICD-10-CM | POA: Diagnosis not present

## 2018-05-14 DIAGNOSIS — S24103D Unspecified injury at T7-T10 level of thoracic spinal cord, subsequent encounter: Secondary | ICD-10-CM | POA: Diagnosis not present

## 2018-05-14 DIAGNOSIS — M86159 Other acute osteomyelitis, unspecified femur: Secondary | ICD-10-CM | POA: Diagnosis not present

## 2018-05-14 DIAGNOSIS — N319 Neuromuscular dysfunction of bladder, unspecified: Secondary | ICD-10-CM | POA: Diagnosis not present

## 2018-05-14 DIAGNOSIS — G8220 Paraplegia, unspecified: Secondary | ICD-10-CM | POA: Diagnosis not present

## 2018-05-14 DIAGNOSIS — L89313 Pressure ulcer of right buttock, stage 3: Secondary | ICD-10-CM | POA: Diagnosis not present

## 2018-05-14 DIAGNOSIS — N139 Obstructive and reflux uropathy, unspecified: Secondary | ICD-10-CM | POA: Diagnosis not present

## 2018-05-15 DIAGNOSIS — Z933 Colostomy status: Secondary | ICD-10-CM | POA: Diagnosis not present

## 2018-05-15 DIAGNOSIS — G8220 Paraplegia, unspecified: Secondary | ICD-10-CM | POA: Diagnosis not present

## 2018-05-15 DIAGNOSIS — L89313 Pressure ulcer of right buttock, stage 3: Secondary | ICD-10-CM | POA: Diagnosis not present

## 2018-05-15 DIAGNOSIS — N319 Neuromuscular dysfunction of bladder, unspecified: Secondary | ICD-10-CM | POA: Diagnosis not present

## 2018-05-15 DIAGNOSIS — M86159 Other acute osteomyelitis, unspecified femur: Secondary | ICD-10-CM | POA: Diagnosis not present

## 2018-05-15 DIAGNOSIS — R32 Unspecified urinary incontinence: Secondary | ICD-10-CM | POA: Diagnosis not present

## 2018-06-05 ENCOUNTER — Telehealth: Payer: Self-pay | Admitting: Internal Medicine

## 2018-06-20 ENCOUNTER — Other Ambulatory Visit: Payer: Self-pay | Admitting: Internal Medicine

## 2018-06-26 ENCOUNTER — Ambulatory Visit (INDEPENDENT_AMBULATORY_CARE_PROVIDER_SITE_OTHER): Payer: Medicaid Other | Admitting: *Deleted

## 2018-06-26 DIAGNOSIS — Z23 Encounter for immunization: Secondary | ICD-10-CM | POA: Diagnosis not present

## 2018-06-30 DIAGNOSIS — S24103D Unspecified injury at T7-T10 level of thoracic spinal cord, subsequent encounter: Secondary | ICD-10-CM | POA: Diagnosis not present

## 2018-06-30 DIAGNOSIS — L89313 Pressure ulcer of right buttock, stage 3: Secondary | ICD-10-CM | POA: Diagnosis not present

## 2018-06-30 DIAGNOSIS — R32 Unspecified urinary incontinence: Secondary | ICD-10-CM | POA: Diagnosis not present

## 2018-06-30 DIAGNOSIS — N139 Obstructive and reflux uropathy, unspecified: Secondary | ICD-10-CM | POA: Diagnosis not present

## 2018-06-30 DIAGNOSIS — M86159 Other acute osteomyelitis, unspecified femur: Secondary | ICD-10-CM | POA: Diagnosis not present

## 2018-06-30 DIAGNOSIS — Z933 Colostomy status: Secondary | ICD-10-CM | POA: Diagnosis not present

## 2018-06-30 DIAGNOSIS — N319 Neuromuscular dysfunction of bladder, unspecified: Secondary | ICD-10-CM | POA: Diagnosis not present

## 2018-06-30 DIAGNOSIS — G8220 Paraplegia, unspecified: Secondary | ICD-10-CM | POA: Diagnosis not present

## 2018-07-15 ENCOUNTER — Other Ambulatory Visit: Payer: Self-pay

## 2018-07-15 ENCOUNTER — Encounter: Payer: Self-pay | Admitting: Internal Medicine

## 2018-07-15 ENCOUNTER — Ambulatory Visit: Payer: Medicaid Other | Admitting: Internal Medicine

## 2018-07-15 VITALS — BP 148/75 | HR 73 | Temp 98.4°F | Ht 64.0 in

## 2018-07-15 DIAGNOSIS — N319 Neuromuscular dysfunction of bladder, unspecified: Secondary | ICD-10-CM | POA: Diagnosis not present

## 2018-07-15 DIAGNOSIS — I10 Essential (primary) hypertension: Secondary | ICD-10-CM | POA: Diagnosis not present

## 2018-07-15 DIAGNOSIS — Z79899 Other long term (current) drug therapy: Secondary | ICD-10-CM

## 2018-07-15 DIAGNOSIS — S24103S Unspecified injury at T7-T10 level of thoracic spinal cord, sequela: Secondary | ICD-10-CM | POA: Diagnosis not present

## 2018-07-15 DIAGNOSIS — E785 Hyperlipidemia, unspecified: Secondary | ICD-10-CM

## 2018-07-15 DIAGNOSIS — W3400XS Accidental discharge from unspecified firearms or gun, sequela: Secondary | ICD-10-CM

## 2018-07-15 DIAGNOSIS — Z993 Dependence on wheelchair: Secondary | ICD-10-CM | POA: Diagnosis not present

## 2018-07-15 DIAGNOSIS — G8929 Other chronic pain: Secondary | ICD-10-CM

## 2018-07-15 DIAGNOSIS — Z933 Colostomy status: Secondary | ICD-10-CM | POA: Diagnosis not present

## 2018-07-15 DIAGNOSIS — M549 Dorsalgia, unspecified: Secondary | ICD-10-CM | POA: Diagnosis not present

## 2018-07-15 DIAGNOSIS — H9192 Unspecified hearing loss, left ear: Secondary | ICD-10-CM | POA: Diagnosis not present

## 2018-07-15 DIAGNOSIS — G8220 Paraplegia, unspecified: Secondary | ICD-10-CM

## 2018-07-15 DIAGNOSIS — Z79891 Long term (current) use of opiate analgesic: Secondary | ICD-10-CM

## 2018-07-15 MED ORDER — TRAMADOL HCL 50 MG PO TABS: 50.0000 mg | ORAL_TABLET | Freq: Every evening | ORAL | 0 refills | Status: DC | PRN
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
## 2018-07-15 NOTE — Patient Instructions (Addendum)
Thank you for coming to the clinic today. It was a pleasure to see you.   For your Blood pressure, please continue taking your blood pressure medications on a daily basis, focus on a low salt diet and eat fresh foods that you have prepared at home and avoid freezer or shelf foods.  FOLLOW-UP INSTRUCTIONS When: 4 month with Dr. Obie Dredge For: Follow up of your chronic pain  What to bring: all of your medication bottles   Please call the internal medicine center clinic if you have any questions or concerns, we may be able to help and keep you from a long and expensive emergency room wait. Our clinic and after hours phone number is [phone], the best time to call is Monday through Friday 9 am to 4 pm but there is always someone available 24/7 if you have an emergency. If you need medication refills please notify your pharmacy one week in advance and they will send [place] a request.

## 2018-07-16 LAB — BMP8+ANION GAP
ANION GAP: 15 mmol/L (ref 10.0–18.0)
BUN/Creatinine Ratio: 17 (ref 9–20)
BUN: 12 mg/dL (ref 6–24)
CO2: 21 mmol/L (ref 20–29)
CREATININE: 0.71 mg/dL — AB (ref 0.76–1.27)
Calcium: 8.9 mg/dL (ref 8.7–10.2)
Chloride: 104 mmol/L (ref 96–106)
GFR, EST AFRICAN AMERICAN: 134 mL/min/{1.73_m2} (ref >59)
GFR, EST NON AFRICAN AMERICAN: 116 mL/min/{1.73_m2} (ref >59)
Glucose: 92 mg/dL (ref 65–99)
Potassium: 3.8 mmol/L (ref 3.5–5.2)
SODIUM: 140 mmol/L (ref 134–144)

## 2018-07-16 NOTE — Progress Notes (Signed)
Internal Medicine Clinic Attending  Case discussed with Dr. Blum at the time of the visit.  We reviewed the resident's history and exam and pertinent patient test results.  I agree with the assessment, diagnosis, and plan of care documented in the resident's note. 

## 2018-07-21 ENCOUNTER — Other Ambulatory Visit: Payer: Self-pay | Admitting: Internal Medicine

## 2018-08-07 ENCOUNTER — Telehealth: Payer: Self-pay | Admitting: *Deleted

## 2018-08-07 NOTE — Telephone Encounter (Addendum)
Information was faxed to [facility] for PA for Tramadol.  Awaiting determination.  Angelina Ok, RN 08/07/2018 4:45 PM.  Call to [employer].  PA for Tramadol was approved 08/07/2018 thru 02/02/2009.  PA [id number].  I -[id number].  Angelina Ok, RN 08/11/2018 12:03 PM.

## 2018-08-18 ENCOUNTER — Encounter: Payer: Self-pay | Admitting: Internal Medicine

## 2018-08-18 ENCOUNTER — Telehealth: Payer: Self-pay | Admitting: Internal Medicine

## 2018-08-18 NOTE — Telephone Encounter (Signed)
I have written a letter and will leave it with our front desk

## 2018-08-18 NOTE — Telephone Encounter (Signed)
Needs a letter to dept social services explaining why pt can not be employed, wife states that the doctor must write this letter, she would like to pick up 11/26 afternoon

## 2018-08-18 NOTE — Telephone Encounter (Signed)
Pt requesting a call back in reference to a letter being requesting for his Disability per his wife Luisa Dago.

## 2018-08-20 DIAGNOSIS — M86159 Other acute osteomyelitis, unspecified femur: Secondary | ICD-10-CM | POA: Diagnosis not present

## 2018-08-20 DIAGNOSIS — Z933 Colostomy status: Secondary | ICD-10-CM | POA: Diagnosis not present

## 2018-08-20 DIAGNOSIS — G8220 Paraplegia, unspecified: Secondary | ICD-10-CM | POA: Diagnosis not present

## 2018-08-20 DIAGNOSIS — L89313 Pressure ulcer of right buttock, stage 3: Secondary | ICD-10-CM | POA: Diagnosis not present

## 2018-09-18 DIAGNOSIS — G8220 Paraplegia, unspecified: Secondary | ICD-10-CM | POA: Diagnosis not present

## 2018-09-18 DIAGNOSIS — N319 Neuromuscular dysfunction of bladder, unspecified: Secondary | ICD-10-CM | POA: Diagnosis not present

## 2018-09-18 DIAGNOSIS — N139 Obstructive and reflux uropathy, unspecified: Secondary | ICD-10-CM | POA: Diagnosis not present

## 2018-09-18 DIAGNOSIS — L89313 Pressure ulcer of right buttock, stage 3: Secondary | ICD-10-CM | POA: Diagnosis not present

## 2018-09-18 DIAGNOSIS — S24103D Unspecified injury at T7-T10 level of thoracic spinal cord, subsequent encounter: Secondary | ICD-10-CM | POA: Diagnosis not present

## 2018-09-18 DIAGNOSIS — R32 Unspecified urinary incontinence: Secondary | ICD-10-CM | POA: Diagnosis not present

## 2018-09-18 DIAGNOSIS — M86159 Other acute osteomyelitis, unspecified femur: Secondary | ICD-10-CM | POA: Diagnosis not present

## 2018-09-18 DIAGNOSIS — Z933 Colostomy status: Secondary | ICD-10-CM | POA: Diagnosis not present

## 2018-10-28 ENCOUNTER — Other Ambulatory Visit: Payer: Self-pay | Admitting: Internal Medicine

## 2018-11-03 NOTE — Progress Notes (Deleted)
Prescription for TDAP   Hypertension  BP Readings from Last 3 Encounters:  07/15/18 (!) 148/75  05/06/18 132/81  01/21/18 (!) 168/98  Thus far evaluation for secondary causes of hypertension including tobacco use, alcohol, NSAID or renal disease has been negative.  - STOP bang, ? Plasma renin activity   - amlodipine 10 mg daily, chlorthalidone 50 mg daily, lisinopril 40 mg daily   Chronic opioid therapy  [name] reports that the gabapentin has been working to alleviate his back pain. He has only needed to take tramadol about three times weekly on the days when his pain is at its worse. This is evident on review of the database which shows that prior to 10/21/18 he had not picked up a 30 days supply since 08/08/18. At last office visit the UDS was innapropriately negative, he had reported taking tramadol the day prior to the exam but it was not observed on the UDS.  - gabapentin 300 mg TID

## 2018-11-04 ENCOUNTER — Encounter: Payer: Medicaid Other | Admitting: Internal Medicine

## 2018-11-04 ENCOUNTER — Encounter: Payer: Self-pay | Admitting: Internal Medicine

## 2018-11-11 ENCOUNTER — Encounter: Payer: Medicaid Other | Admitting: Internal Medicine

## 2018-11-13 DIAGNOSIS — N319 Neuromuscular dysfunction of bladder, unspecified: Secondary | ICD-10-CM | POA: Diagnosis not present

## 2018-11-13 DIAGNOSIS — M86159 Other acute osteomyelitis, unspecified femur: Secondary | ICD-10-CM | POA: Diagnosis not present

## 2018-11-13 DIAGNOSIS — G8220 Paraplegia, unspecified: Secondary | ICD-10-CM | POA: Diagnosis not present

## 2018-11-13 DIAGNOSIS — Z933 Colostomy status: Secondary | ICD-10-CM | POA: Diagnosis not present

## 2018-11-13 DIAGNOSIS — R32 Unspecified urinary incontinence: Secondary | ICD-10-CM | POA: Diagnosis not present

## 2018-11-13 DIAGNOSIS — L89313 Pressure ulcer of right buttock, stage 3: Secondary | ICD-10-CM | POA: Diagnosis not present

## 2018-11-18 ENCOUNTER — Other Ambulatory Visit: Payer: Self-pay | Admitting: Internal Medicine

## 2018-12-04 DIAGNOSIS — H40013 Open angle with borderline findings, low risk, bilateral: Secondary | ICD-10-CM | POA: Diagnosis not present

## 2018-12-23 ENCOUNTER — Ambulatory Visit (INDEPENDENT_AMBULATORY_CARE_PROVIDER_SITE_OTHER): Payer: Medicaid Other | Admitting: Internal Medicine

## 2018-12-23 ENCOUNTER — Other Ambulatory Visit: Payer: Self-pay

## 2018-12-23 DIAGNOSIS — Z79891 Long term (current) use of opiate analgesic: Secondary | ICD-10-CM

## 2018-12-23 DIAGNOSIS — I10 Essential (primary) hypertension: Secondary | ICD-10-CM
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
## 2018-12-24 ENCOUNTER — Encounter: Payer: Self-pay | Admitting: Internal Medicine

## 2018-12-24 MED ORDER — CHLORTHALIDONE 25 MG PO TABS: 12.5000 mg | ORAL_TABLET | Freq: Every day | ORAL | 2 refills | Status: DC

## 2018-12-24 NOTE — Patient Instructions (Signed)
-   start taking chlorthalidone 12.5 mg daily - continue taking amlodipine and lisinopril  - return to clinic in one month to recheck your blood pressure and labwork

## 2018-12-24 NOTE — Assessment & Plan Note (Signed)
Cameron Zavala reports that the gabapentin has been working to alleviate his back pain. He has only needed to take tramadol about three times weekly on the days when his pain is at its worse. This is evident on review of the database which shows that prior to 12/17/18 he had not picked up a 30 days supply (20 tablets) since 10/21/17. At last office visit the UDS was negative, considering that he takes the medication only a few times per week this could be appropriate.  - continue gabapentin 300 mg TID - continue tramadol as needed

## 2018-12-24 NOTE — Assessment & Plan Note (Signed)
Patient is contacted for follow up of hypertension. At the time of last office visit his blood pressure was uncontrolled however he had not yet taken his medications for the day. He has not had his blood pressure taken since the time of last office visit. He has a wrist blood pressure cuff at home that a home health nurse checked in the past and found was not accurate. Today we reviewed his medications, he says that he has been taking amlodipine 10 mg daily, lisinopril 40 mg daily. He is prescribed chlorthalidone but is not currently taking it and has no recollection of taking is medications in the past. Thus far evaluation for secondary causes of hypertension including tobacco use, alcohol, NSAID or renal disease has been negative.  - continue amlodipine 10 mg daily and  lisinopril 40 mg daily  - start chlorthalidone at 12.5 mg daily, - return to clinic in one month for blood pressure recheck and follow up BMP, could consider Plasma renin activity. I will coordinate with the front desk to get this scheduled.

## 2018-12-24 NOTE — Progress Notes (Signed)
Internal Medicine Clinic Attending  Case discussed with Dr. Blum at the time of the visit.  We reviewed the resident's history and exam and pertinent patient test results.  I agree with the assessment, diagnosis, and plan of care documented in the resident's note. 

## 2019-01-12 DIAGNOSIS — R32 Unspecified urinary incontinence: Secondary | ICD-10-CM | POA: Diagnosis not present

## 2019-01-12 DIAGNOSIS — G8220 Paraplegia, unspecified: Secondary | ICD-10-CM | POA: Diagnosis not present

## 2019-01-12 DIAGNOSIS — Z933 Colostomy status: Secondary | ICD-10-CM | POA: Diagnosis not present

## 2019-01-12 DIAGNOSIS — S24103D Unspecified injury at T7-T10 level of thoracic spinal cord, subsequent encounter: Secondary | ICD-10-CM | POA: Diagnosis not present

## 2019-01-26 ENCOUNTER — Other Ambulatory Visit: Payer: Self-pay | Admitting: Internal Medicine

## 2019-01-26 NOTE — Telephone Encounter (Signed)
Will defer to PCP

## 2019-01-27 ENCOUNTER — Other Ambulatory Visit: Payer: Self-pay

## 2019-01-27 ENCOUNTER — Ambulatory Visit (INDEPENDENT_AMBULATORY_CARE_PROVIDER_SITE_OTHER): Payer: Medicaid Other | Admitting: Internal Medicine

## 2019-01-27 DIAGNOSIS — I10 Essential (primary) hypertension: Secondary | ICD-10-CM | POA: Diagnosis not present

## 2019-01-27 MED ORDER — HYDROCHLOROTHIAZIDE 25 MG PO TABS: 25.0000 mg | ORAL_TABLET | Freq: Every day | ORAL | 1 refills | Status: DC
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
## 2019-01-28 ENCOUNTER — Encounter: Payer: Self-pay | Admitting: Internal Medicine

## 2019-01-28 MED ORDER — LISINOPRIL 40 MG PO TABS: 40.0000 mg | ORAL_TABLET | Freq: Every day | ORAL | 4 refills | Status: DC

## 2019-01-28 NOTE — Progress Notes (Signed)
Internal Medicine Clinic Attending  Case discussed with Dr. Blum at the time of the visit.  We reviewed the resident's history and exam and pertinent patient test results.  I agree with the assessment, diagnosis, and plan of care documented in the resident's note. 

## 2019-02-05 ENCOUNTER — Ambulatory Visit: Payer: Medicaid Other

## 2019-02-08 ENCOUNTER — Encounter: Payer: Self-pay | Admitting: *Deleted

## 2019-02-11 ENCOUNTER — Other Ambulatory Visit: Payer: Self-pay | Admitting: Internal Medicine

## 2019-02-12 DIAGNOSIS — S24103D Unspecified injury at T7-T10 level of thoracic spinal cord, subsequent encounter: Secondary | ICD-10-CM | POA: Diagnosis not present

## 2019-02-12 DIAGNOSIS — R32 Unspecified urinary incontinence: Secondary | ICD-10-CM | POA: Diagnosis not present

## 2019-02-12 DIAGNOSIS — Z933 Colostomy status: Secondary | ICD-10-CM | POA: Diagnosis not present

## 2019-02-12 DIAGNOSIS — G8220 Paraplegia, unspecified: Secondary | ICD-10-CM | POA: Diagnosis not present

## 2019-02-12 NOTE — Telephone Encounter (Signed)
Tramadol was filled two weeks ago and chlorthalidone caused side effects so he was switched to HCTZ. Was this request auto generated from pharmacy?

## 2019-02-24 ENCOUNTER — Encounter: Payer: Medicaid Other | Admitting: Internal Medicine

## 2019-02-24 ENCOUNTER — Other Ambulatory Visit: Payer: Self-pay

## 2019-03-06 DIAGNOSIS — G8220 Paraplegia, unspecified: Secondary | ICD-10-CM | POA: Diagnosis not present

## 2019-03-06 DIAGNOSIS — S24103D Unspecified injury at T7-T10 level of thoracic spinal cord, subsequent encounter: Secondary | ICD-10-CM | POA: Diagnosis not present

## 2019-03-06 DIAGNOSIS — R32 Unspecified urinary incontinence: Secondary | ICD-10-CM | POA: Diagnosis not present

## 2019-03-06 DIAGNOSIS — Z933 Colostomy status: Secondary | ICD-10-CM | POA: Diagnosis not present

## 2019-03-10 ENCOUNTER — Other Ambulatory Visit: Payer: Self-pay | Admitting: Internal Medicine

## 2019-03-10 ENCOUNTER — Encounter: Payer: Medicaid Other | Admitting: Internal Medicine

## 2019-03-16 ENCOUNTER — Telehealth: Payer: Self-pay | Admitting: *Deleted

## 2019-03-16 ENCOUNTER — Other Ambulatory Visit: Payer: Self-pay | Admitting: Internal Medicine

## 2019-03-16 NOTE — Telephone Encounter (Signed)
Completed and signed CMN for Pouches faxed to [place] at [phone]. Hubbard Hartshorn, RN, BSN

## 2019-03-18 ENCOUNTER — Other Ambulatory Visit: Payer: Self-pay | Admitting: Internal Medicine

## 2019-03-18 MED ORDER — TRAMADOL HCL 50 MG PO TABS: 50.0000 mg | ORAL_TABLET | Freq: Every evening | ORAL | 0 refills | Status: DC | PRN

## 2019-03-18 NOTE — Progress Notes (Signed)
Pls sch PCP appt July or Aug

## 2019-03-19 ENCOUNTER — Encounter: Payer: Self-pay | Admitting: *Deleted

## 2019-04-06 DIAGNOSIS — G8220 Paraplegia, unspecified: Secondary | ICD-10-CM | POA: Diagnosis not present

## 2019-04-06 DIAGNOSIS — S24103D Unspecified injury at T7-T10 level of thoracic spinal cord, subsequent encounter: Secondary | ICD-10-CM | POA: Diagnosis not present

## 2019-04-06 DIAGNOSIS — Z933 Colostomy status: Secondary | ICD-10-CM | POA: Diagnosis not present

## 2019-04-06 DIAGNOSIS — R32 Unspecified urinary incontinence: Secondary | ICD-10-CM | POA: Diagnosis not present

## 2019-04-07 ENCOUNTER — Other Ambulatory Visit: Payer: Self-pay

## 2019-04-07 ENCOUNTER — Encounter: Payer: Self-pay | Admitting: Radiation Oncology

## 2019-04-07 ENCOUNTER — Ambulatory Visit: Payer: Medicaid Other | Admitting: Radiation Oncology

## 2019-04-07 VITALS — BP 143/75 | HR 70 | Temp 98.5°F

## 2019-04-07 DIAGNOSIS — N319 Neuromuscular dysfunction of bladder, unspecified: Secondary | ICD-10-CM

## 2019-04-07 DIAGNOSIS — G8220 Paraplegia, unspecified: Secondary | ICD-10-CM

## 2019-04-07 DIAGNOSIS — Z993 Dependence on wheelchair: Secondary | ICD-10-CM

## 2019-04-07 DIAGNOSIS — S24103S Unspecified injury at T7-T10 level of thoracic spinal cord, sequela: Secondary | ICD-10-CM | POA: Diagnosis not present

## 2019-04-07 DIAGNOSIS — Z933 Colostomy status: Secondary | ICD-10-CM | POA: Diagnosis not present

## 2019-04-07 DIAGNOSIS — Z79899 Other long term (current) drug therapy: Secondary | ICD-10-CM

## 2019-04-07 DIAGNOSIS — Z79891 Long term (current) use of opiate analgesic: Secondary | ICD-10-CM | POA: Diagnosis not present

## 2019-04-07 DIAGNOSIS — N39498 Other specified urinary incontinence: Secondary | ICD-10-CM | POA: Diagnosis not present

## 2019-04-07 DIAGNOSIS — W3400XS Accidental discharge from unspecified firearms or gun, sequela: Secondary | ICD-10-CM

## 2019-04-07 DIAGNOSIS — I10 Essential (primary) hypertension: Secondary | ICD-10-CM | POA: Diagnosis not present

## 2019-04-07 DIAGNOSIS — G8929 Other chronic pain: Secondary | ICD-10-CM | POA: Diagnosis not present

## 2019-04-07 DIAGNOSIS — E785 Hyperlipidemia, unspecified: Secondary | ICD-10-CM

## 2019-04-07 DIAGNOSIS — R109 Unspecified abdominal pain: Secondary | ICD-10-CM

## 2019-04-07 DIAGNOSIS — M549 Dorsalgia, unspecified: Secondary | ICD-10-CM | POA: Diagnosis not present

## 2019-04-07 MED ORDER — TRAMADOL HCL 50 MG PO TABS: 50.0000 mg | ORAL_TABLET | Freq: Every evening | ORAL | 0 refills | Status: DC | PRN
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
## 2019-04-07 NOTE — Assessment & Plan Note (Signed)
Cameron Zavala is on chronic opioid therapy for chronic pain. The date of the controlled substances contract is referenced in the FYI and / or the overview. Date of pain contract was 07/23/2017. As part of the treatment plan, the [place] controlled substance database is checked at least twice yearly and the database results are appropriate. I have last reviewed the results today (04/07/2019) .      The last UDS was on 05/06/2018 and results are as expected. Patient needs at least a yearly UDS. Will repeat today.  The patient is on tramadol (Ultram) strength 50 mg, #20 per 30 days. Adjunctive treatment includes gabapentin. This regimen allows [name] to function and does not cause excessive sedation or other side effects. He requests additional pills as sometimes he has to take more than one pill in a day. We discussed using Tylenol and Ibuprofen before increasing his tramadol and he seemed amenable to that.   "The benefits of continuing opioid therapy outweigh the risks and chronic opioids will be continued. Ongoing education about safe opioid treatment is provided 
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
## 2019-04-07 NOTE — Assessment & Plan Note (Signed)
[place] Ponce[place] is on chronic opioid therapy for chronic pain. The date of the controlled substances contract is referenced in the [place] and / or the overview. Date of pain contract was 07/23/2017. As part of the treatment plan, the [place] controlled substance database is checked at least twice yearly and the database results are appropriate. I have last reviewed the results today (04/07/2019) .      The last UDS was on 05/06/2018 and results are as expected. Patient needs at least a yearly UDS. Will repeat today.  The patient is on tramadol (Ultram) strength 50 mg, #20 per 30 days. Adjunctive treatment includes gabapentin. This regimen allows [place] to function and does not cause excessive sedation or other side effects. He requests additional pills as sometimes he has to take more than one pill in a day. We discussed using Tylenol and Ibuprofen before increasing his tramadol and he seemed amenable to that.   "The benefits of continuing opioid therapy outweigh the risks and chronic opioids will be continued. Ongoing education about safe opioid treatment is provided

## 2019-04-07 NOTE — Patient Instructions (Addendum)
Today we discussed your hypertension. Please continue your current medications and try and limit your salt intake. We will reevaluate when you follow up.  We also discussed your chronic pain. Please try using some tylenol and ibuprofen over the counter and if pain is not improved please give me a call. I have also represcribed your Tramadol.   We are glad to hear you do not have any wounds currently.  Today we got some blood work and we will follow up on those results at our next visit or I will call you if there is anything abnormal.  It was great meeting you. I look forward to our follow up in 3 months.

## 2019-04-07 NOTE — Assessment & Plan Note (Addendum)
Blood pressure is 147/77 with treatment with amlodipine 10 mg, hydrochlorothiazide 25 and lisinopril 40 daily. He has had normal renal function on his last BMP back in 06/2018.  - continue Amlodipine 10 mg daily, hydrochlorothiazide 25 mg daily, lisinopril 40 mg daily with reevaluation at next appointment. He had switched from to hydrochlorothiazide from chlorthalidone due to muscle cramping. Denies tobacco use, alcohol use and sx of OSA including waking up choking, AM headaches and daytime fatigue. Fu closely. Wife says he eats lots of salt. We discussed trying to limit that between now and our next appointment.  - bp was rechecked and was 143/75 - work on decreasing your sodium intake - obtain BMP today - fu in 3 months

## 2019-04-08 NOTE — Progress Notes (Signed)
Internal Medicine Clinic Attending  I saw and evaluated the patient.  I personally confirmed the key portions of the history and exam documented by Dr. Lanier and I reviewed pertinent patient test results.  The assessment, diagnosis, and plan were formulated together and I agree with the documentation in the resident's note.   

## 2019-04-10 LAB — DRUG SCREEN 10 W/CONF, WB
Amphetamines, IA: NEGATIVE ng/mL
Barbiturates, IA: NEGATIVE ug/mL
Benzodiazepines, IA: NEGATIVE ng/mL
Cocaine/Metabolite, IA: NEGATIVE ng/mL
Methadone, IA: NEGATIVE ng/mL
Opiates, IA: NEGATIVE ng/mL
Oxycodones, IA: NEGATIVE ng/mL
Phencyclidine, IA: NEGATIVE ng/mL
Propoxyphene, IA: NEGATIVE ng/mL
THC (Marijuana) Mtb, IA: NEGATIVE ng/mL

## 2019-04-10 LAB — BMP8+ANION GAP
Anion Gap: 19 mmol/L — ABNORMAL HIGH (ref 10.0–18.0)
BUN/Creatinine Ratio: 11 (ref 9–20)
BUN: 9 mg/dL (ref 6–24)
CO2: 21 mmol/L (ref 20–29)
Calcium: 9.1 mg/dL (ref 8.7–10.2)
Chloride: 100 mmol/L (ref 96–106)
Creatinine, Ser: 0.84 mg/dL (ref 0.76–1.27)
GFR calc Af Amer: 124 mL/min/{1.73_m2} (ref >59)
GFR calc non Af Amer: 107 mL/min/{1.73_m2} (ref >59)
Glucose: 93 mg/dL (ref 65–99)
Potassium: 4.2 mmol/L (ref 3.5–5.2)
Sodium: 140 mmol/L (ref 134–144)

## 2019-05-06 DIAGNOSIS — R32 Unspecified urinary incontinence: Secondary | ICD-10-CM | POA: Diagnosis not present

## 2019-05-06 DIAGNOSIS — G8220 Paraplegia, unspecified: Secondary | ICD-10-CM | POA: Diagnosis not present

## 2019-05-06 DIAGNOSIS — S24103D Unspecified injury at T7-T10 level of thoracic spinal cord, subsequent encounter: Secondary | ICD-10-CM | POA: Diagnosis not present

## 2019-05-06 DIAGNOSIS — Z933 Colostomy status: Secondary | ICD-10-CM | POA: Diagnosis not present

## 2019-05-13 ENCOUNTER — Other Ambulatory Visit: Payer: Self-pay

## 2019-05-13 MED ORDER — TRAMADOL HCL 50 MG PO TABS: 50.0000 mg | ORAL_TABLET | Freq: Every evening | ORAL | 0 refills | Status: DC | PRN

## 2019-05-13 NOTE — Telephone Encounter (Signed)
Pls Sch Oct Nov appt PCP

## 2019-05-13 NOTE — Telephone Encounter (Signed)
traMADol (ULTRAM) 50 MG tablet     REFILL REQUEST @  [place], [place] [employer] [address] [phone] (Phone) [phone] (Fax)

## 2019-05-27 ENCOUNTER — Other Ambulatory Visit: Payer: Self-pay

## 2019-05-27 ENCOUNTER — Ambulatory Visit (INDEPENDENT_AMBULATORY_CARE_PROVIDER_SITE_OTHER): Payer: Medicaid Other | Admitting: *Deleted

## 2019-05-27 DIAGNOSIS — Z23 Encounter for immunization: Secondary | ICD-10-CM

## 2019-06-25 DIAGNOSIS — R32 Unspecified urinary incontinence: Secondary | ICD-10-CM | POA: Diagnosis not present

## 2019-06-25 DIAGNOSIS — S24103D Unspecified injury at T7-T10 level of thoracic spinal cord, subsequent encounter: Secondary | ICD-10-CM | POA: Diagnosis not present

## 2019-06-25 DIAGNOSIS — G8220 Paraplegia, unspecified: Secondary | ICD-10-CM | POA: Diagnosis not present

## 2019-06-25 DIAGNOSIS — Z933 Colostomy status: Secondary | ICD-10-CM | POA: Diagnosis not present

## 2019-07-07 ENCOUNTER — Encounter: Payer: Medicaid Other | Admitting: Radiation Oncology

## 2019-07-14 DIAGNOSIS — Z933 Colostomy status: Secondary | ICD-10-CM | POA: Diagnosis not present

## 2019-07-14 DIAGNOSIS — R32 Unspecified urinary incontinence: Secondary | ICD-10-CM | POA: Diagnosis not present

## 2019-07-14 DIAGNOSIS — G8220 Paraplegia, unspecified: Secondary | ICD-10-CM | POA: Diagnosis not present

## 2019-07-14 DIAGNOSIS — S24103D Unspecified injury at T7-T10 level of thoracic spinal cord, subsequent encounter: Secondary | ICD-10-CM | POA: Diagnosis not present

## 2019-08-04 DIAGNOSIS — Z933 Colostomy status: Secondary | ICD-10-CM | POA: Diagnosis not present

## 2019-08-04 DIAGNOSIS — R32 Unspecified urinary incontinence: Secondary | ICD-10-CM | POA: Diagnosis not present

## 2019-08-04 DIAGNOSIS — G8220 Paraplegia, unspecified: Secondary | ICD-10-CM | POA: Diagnosis not present

## 2019-08-04 DIAGNOSIS — S24103D Unspecified injury at T7-T10 level of thoracic spinal cord, subsequent encounter: Secondary | ICD-10-CM | POA: Diagnosis not present

## 2019-08-10 ENCOUNTER — Other Ambulatory Visit: Payer: Self-pay | Admitting: Radiation Oncology

## 2019-08-10 NOTE — Telephone Encounter (Signed)
Refill Request  traMADol (ULTRAM) 50 MG tablet gabapentin (NEURONTIN) 300 MG capsule   [facility] - [place], [place] - [address]

## 2019-08-13 DIAGNOSIS — S24103D Unspecified injury at T7-T10 level of thoracic spinal cord, subsequent encounter: Secondary | ICD-10-CM | POA: Diagnosis not present

## 2019-08-13 DIAGNOSIS — R32 Unspecified urinary incontinence: Secondary | ICD-10-CM | POA: Diagnosis not present

## 2019-08-13 DIAGNOSIS — Z933 Colostomy status: Secondary | ICD-10-CM | POA: Diagnosis not present

## 2019-08-13 DIAGNOSIS — G8220 Paraplegia, unspecified: Secondary | ICD-10-CM | POA: Diagnosis not present

## 2019-08-14 ENCOUNTER — Telehealth: Payer: Self-pay | Admitting: *Deleted

## 2019-08-14 NOTE — Telephone Encounter (Signed)
Patient's wife called and is requesting another letter stating her husband is not able to work.  The same letter that was done for her 6 months ago.  Need ASAP.

## 2019-08-17 ENCOUNTER — Encounter: Payer: Self-pay | Admitting: Radiation Oncology

## 2019-08-17 ENCOUNTER — Telehealth: Payer: Self-pay | Admitting: Radiation Oncology

## 2019-08-17 NOTE — Telephone Encounter (Signed)
I completed the letter and sent to the patient via Epic.

## 2019-08-17 NOTE — Telephone Encounter (Signed)
Pt wife is calling regarding a letter, pls contact [phone]

## 2019-08-17 NOTE — Telephone Encounter (Signed)
I completed one which can be found in his chart.

## 2019-08-17 NOTE — Telephone Encounter (Signed)
Pt's wife requesting her PCP to write a letter be faxed to Chandra Batch @ [employer] in reference to his ability to work letter.  The case number is Case ID #[id number] Fax number [phone]. Pt's wife states it need to be completed by 08/20/2019.

## 2019-08-18 NOTE — Telephone Encounter (Signed)
Printed and faxed

## 2019-08-31 ENCOUNTER — Other Ambulatory Visit: Payer: Self-pay

## 2019-08-31 ENCOUNTER — Ambulatory Visit: Payer: Medicaid Other | Admitting: Internal Medicine

## 2019-08-31 ENCOUNTER — Encounter: Payer: Self-pay | Admitting: Internal Medicine

## 2019-08-31 VITALS — BP 124/78 | HR 84 | Temp 98.5°F | Ht 64.0 in

## 2019-08-31 DIAGNOSIS — Z79891 Long term (current) use of opiate analgesic: Secondary | ICD-10-CM | POA: Diagnosis not present

## 2019-08-31 DIAGNOSIS — I10 Essential (primary) hypertension: Secondary | ICD-10-CM | POA: Diagnosis not present

## 2019-08-31 DIAGNOSIS — Z79899 Other long term (current) drug therapy: Secondary | ICD-10-CM | POA: Diagnosis not present

## 2019-08-31 DIAGNOSIS — H9192 Unspecified hearing loss, left ear: Secondary | ICD-10-CM | POA: Diagnosis not present

## 2019-08-31 DIAGNOSIS — M792 Neuralgia and neuritis, unspecified: Secondary | ICD-10-CM | POA: Insufficient documentation

## 2019-08-31 DIAGNOSIS — H9203 Otalgia, bilateral: Secondary | ICD-10-CM

## 2019-08-31 MED ORDER — AMLODIPINE BESYLATE 10 MG PO TABS: 10.0000 mg | ORAL_TABLET | Freq: Every day | ORAL | 1 refills | Status: DC

## 2019-08-31 MED ORDER — TRAMADOL HCL 50 MG PO TABS: 50.0000 mg | ORAL_TABLET | Freq: Every evening | ORAL | 2 refills | Status: DC | PRN

## 2019-08-31 MED ORDER — HYDROCORTISONE-ACETIC ACID 1-2 % OT SOLN: 4.0000 [drp] | Freq: Two times a day (BID) | OTIC | 0 refills | Status: DC

## 2019-08-31 MED ORDER — HYDROCHLOROTHIAZIDE 25 MG PO TABS: 25.0000 mg | ORAL_TABLET | Freq: Every day | ORAL | 1 refills | Status: DC

## 2019-08-31 MED ORDER — LISINOPRIL 40 MG PO TABS: 40.0000 mg | ORAL_TABLET | Freq: Every day | ORAL | 1 refills | Status: DC

## 2019-08-31 MED ORDER — GABAPENTIN 300 MG PO CAPS: 300.0000 mg | ORAL_CAPSULE | Freq: Three times a day (TID) | ORAL | 1 refills | Status: DC
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
## 2019-08-31 NOTE — Patient Instructions (Signed)
FOLLOW-UP INSTRUCTIONS When: 3 months For: routine visit and med refill What to bring: All of your medications  I have increased the tramadol to 30 tablets daily.  Please continue to take your medication sparingly.  Please continue current blood pressure medications.  Provided you with prescription for eardrops.  Please put 4 drops into each ear twice daily.  If your symptoms do not improve in 10 days please notify us I will refer you to an ENT doctor.  Thank you for your visit to the [facility] today. If you have any questions or concerns please call us at [phone].

## 2019-09-01 ENCOUNTER — Telehealth: Payer: Self-pay | Admitting: *Deleted

## 2019-09-01 ENCOUNTER — Encounter: Payer: Self-pay | Admitting: Internal Medicine

## 2019-09-01 DIAGNOSIS — H9203 Otalgia, bilateral: Secondary | ICD-10-CM | POA: Insufficient documentation

## 2019-09-01 NOTE — Telephone Encounter (Addendum)
Information was faxed to [employer] for PA for Tramadol 50 mg.  Awaiting determination within 24 hours.  Sander Nephew, RN 09/01/2019 9:23 AM  Call to [employer] for PA for Tramadol.  Approved 09/01/2019 thru 02/28/2020.  [place] [phone]. I [id number]  Sander Nephew, RN 09/02/2019 9;20 AM.

## 2019-09-01 NOTE — Assessment & Plan Note (Signed)
Continue gabapentin and tramadol. 

## 2019-09-01 NOTE — Assessment & Plan Note (Signed)
Ear Pain: The patient endorses pain and popping sensation in the left ear.  States that this is a frequent occurrence.  He denies fever, sinus drainage, conjunctivitis, headache, rash, jaw pain, neck pain, sore throat, postnasal drainage or cough.  On exam there appears to be increased serum production on the right in excess skin.  On the left the canal is severely narrowed with difficulty protruding past the initial angle to visualize the tympanic membrane.  It appears to be closing off temporarily as the patient opens his mouth.  It is tender to palpation. This could represent acute otitis externa. There is mild discomfort in the right ear.  Plan:  I provided patient with acetic acid hydrocortisone drops to be applied twice daily.  If his symptoms do not improve the next 10 days I would recommend ENT referral promptly.

## 2019-09-01 NOTE — Assessment & Plan Note (Signed)
Long term current use of opiate analgesic: Patient endorses being started on tramadol q. monthly to be taken as needed at night for sleep.  The patient states that he has chronic pain and difficulty sleeping every night.  He discussed the ineffectiveness of the tramadol given how he has 20 tablets in a 30-day timeframe.  Patient appears to be appropriate with his urine toxicology screens and PDMP review.  Plan: After discussing with Dr. Johnnette Gourd we have agreed to increase his tramadol to #30 monthly of the 50mg  tablet.  Patient was advised to the risks of increasing doses of tramadol.  Refill x2 provided Continue gabapentin Follow-up with PCP in 3 months

## 2019-09-01 NOTE — Assessment & Plan Note (Signed)
Hypertension: Patient's BP today is 124/78 with a goal of <140/80. The patient endorses adherence to his medication regimen.  He denied, chest pain, headache, visual changes, lightheadedness, weakness, dizziness on standing, swelling in the feet or ankles.  The patient and wife are requesting that the medication be prescribed q. 90 days.  It appears per the chart that this has been ordered as per the request but per the patient's prescriptions he has brought with him today they are being filled at 30-day intervals.  We will request the refill is a 90-day intervals.  Plan: Continue with PT milligrams daily Continue HCTZ 20 mg daily Continue lisinopril 40 mg daily BMP next visit every 3-6 months

## 2019-09-02 ENCOUNTER — Telehealth: Payer: Self-pay | Admitting: Radiation Oncology

## 2019-09-02 DIAGNOSIS — H9203 Otalgia, bilateral: Secondary | ICD-10-CM

## 2019-09-02 MED ORDER — NEOMYCIN-POLYMYXIN-HC 3.5-10000-1 OT SUSP: 4.0000 [drp] | Freq: Four times a day (QID) | OTIC | 0 refills | Status: DC

## 2019-09-02 NOTE — Telephone Encounter (Signed)
rtc to pt, lm for rtc or to check at pharmacy

## 2019-09-02 NOTE — Telephone Encounter (Signed)
Pls contact pt or wife, unable to afford the ear drops [phone]

## 2019-09-02 NOTE — Progress Notes (Signed)
Internal Medicine Clinic Attending  I saw and evaluated the patient.  I personally confirmed the key portions of the history and exam documented by Dr. Harbrecht and I reviewed pertinent patient test results.  The assessment, diagnosis, and plan were formulated together and I agree with the documentation in the resident's note.  

## 2019-09-02 NOTE — Telephone Encounter (Signed)
I sent a script for alternative ear drops.

## 2019-09-10 DIAGNOSIS — R32 Unspecified urinary incontinence: Secondary | ICD-10-CM | POA: Diagnosis not present

## 2019-09-10 DIAGNOSIS — G8220 Paraplegia, unspecified: Secondary | ICD-10-CM | POA: Diagnosis not present

## 2019-09-10 DIAGNOSIS — S24103D Unspecified injury at T7-T10 level of thoracic spinal cord, subsequent encounter: Secondary | ICD-10-CM | POA: Diagnosis not present

## 2019-09-10 DIAGNOSIS — Z933 Colostomy status: Secondary | ICD-10-CM | POA: Diagnosis not present

## 2019-10-09 DIAGNOSIS — G8220 Paraplegia, unspecified: Secondary | ICD-10-CM | POA: Diagnosis not present

## 2019-10-09 DIAGNOSIS — S24103D Unspecified injury at T7-T10 level of thoracic spinal cord, subsequent encounter: Secondary | ICD-10-CM | POA: Diagnosis not present

## 2019-10-09 DIAGNOSIS — Z933 Colostomy status: Secondary | ICD-10-CM | POA: Diagnosis not present

## 2019-10-09 DIAGNOSIS — R32 Unspecified urinary incontinence: Secondary | ICD-10-CM | POA: Diagnosis not present

## 2019-11-16 ENCOUNTER — Other Ambulatory Visit: Payer: Self-pay | Admitting: Internal Medicine

## 2019-11-16 DIAGNOSIS — Z79891 Long term (current) use of opiate analgesic: Secondary | ICD-10-CM

## 2019-11-26 DIAGNOSIS — G8220 Paraplegia, unspecified: Secondary | ICD-10-CM | POA: Diagnosis not present

## 2019-11-26 DIAGNOSIS — Z933 Colostomy status: Secondary | ICD-10-CM | POA: Diagnosis not present

## 2019-11-26 DIAGNOSIS — S24103D Unspecified injury at T7-T10 level of thoracic spinal cord, subsequent encounter: Secondary | ICD-10-CM | POA: Diagnosis not present

## 2019-11-26 DIAGNOSIS — R32 Unspecified urinary incontinence: Secondary | ICD-10-CM | POA: Diagnosis not present

## 2019-12-08 DIAGNOSIS — H40013 Open angle with borderline findings, low risk, bilateral: Secondary | ICD-10-CM | POA: Diagnosis not present

## 2019-12-08 DIAGNOSIS — H25813 Combined forms of age-related cataract, bilateral: Secondary | ICD-10-CM | POA: Diagnosis not present

## 2019-12-08 DIAGNOSIS — H353131 Nonexudative age-related macular degeneration, bilateral, early dry stage: Secondary | ICD-10-CM | POA: Diagnosis not present

## 2019-12-15 ENCOUNTER — Ambulatory Visit: Payer: Medicaid Other | Admitting: Radiation Oncology

## 2019-12-15 ENCOUNTER — Encounter: Payer: Self-pay | Admitting: Radiation Oncology

## 2019-12-15 VITALS — BP 133/75 | HR 79 | Temp 98.2°F | Ht 64.0 in

## 2019-12-15 DIAGNOSIS — Z79891 Long term (current) use of opiate analgesic: Secondary | ICD-10-CM

## 2019-12-15 DIAGNOSIS — R109 Unspecified abdominal pain: Secondary | ICD-10-CM

## 2019-12-15 DIAGNOSIS — Z79899 Other long term (current) drug therapy: Secondary | ICD-10-CM

## 2019-12-15 DIAGNOSIS — Z993 Dependence on wheelchair: Secondary | ICD-10-CM

## 2019-12-15 DIAGNOSIS — I10 Essential (primary) hypertension: Secondary | ICD-10-CM | POA: Diagnosis not present

## 2019-12-15 DIAGNOSIS — G8929 Other chronic pain: Secondary | ICD-10-CM | POA: Diagnosis not present

## 2019-12-15 DIAGNOSIS — G8220 Paraplegia, unspecified: Secondary | ICD-10-CM | POA: Diagnosis not present

## 2019-12-15 MED ORDER — TRAMADOL HCL 50 MG PO TABS: 50.0000 mg | ORAL_TABLET | Freq: Every evening | ORAL | 2 refills | Status: DC | PRN

## 2019-12-15 NOTE — Patient Instructions (Addendum)
Thank you for coming to your appointment. It was so nice to see you. Today we discussed  [place] was seen today for follow-up.  Diagnoses and all orders for this visit:  Essential hypertension -blood pressure well controlled, keep up the good work!  Neuropathic pain of flank, right Long term current use of opiate analgesic -     traMADol (ULTRAM) 50 MG tablet; Take 1 tablet (50 mg total) by mouth at bedtime as needed for up to 90 doses.  I look forward to seeing you again soon at your follow up in 3 months.  Sincerely,  Jenell Milliner, MD
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
## 2019-12-16 ENCOUNTER — Encounter: Payer: Self-pay | Admitting: Radiation Oncology

## 2019-12-16 NOTE — Assessment & Plan Note (Signed)
This is chronic and well controlled.  Patient with hx of htn treated with amlodipine, hctz and lisinopril. Blood pressure initially elevated today but was normal on repeat. Patient reports taking his medications regularly and having taken them in the morning before his appointment. He denies medication side effects.  Vitals:   12/15/19 1431 12/15/19 1515  BP: (!) 148/76 133/75  Pulse: 84 79  Temp: 98.2 F (36.8 C)   SpO2: 97%     Plan: -continue current medications

## 2019-12-16 NOTE — Assessment & Plan Note (Signed)
Patient with hx of chronic pain associated with his paraplegia for which he takes tramadol. Patient has a controlled substances contract dated 07/23/2017. As part of the treatment plan, the [place] controlled substance database is checked at least twice yearly and the database results are appropriate. I have last reviewed the results today (12/16/2019) .      The last UDS was on 04/07/2019 and results are as expected. Patient needs at least a yearly UDS.   The patient is on tramadol (Ultram) strength 50 mg, recently increased to #30 per 30 days. Adjunctive treatment includes gabapentin. This regimen allows [name] to function and does not cause excessive sedation or other side effects.   The benefits of continuing opioid therapy outweigh the risks and chronic opioids will be continued. Ongoing education about safe opioid treatment is provided  Plan: refilled tramadol 50 mg daily

## 2019-12-29 NOTE — Progress Notes (Signed)
Internal Medicine Clinic Attending  Case discussed with Dr. Lanier at the time of the visit.  We reviewed the resident's history and exam and pertinent patient test results.  I agree with the assessment, diagnosis, and plan of care documented in the resident's note.  Danine Hor, M.D., Ph.D.  

## 2019-12-31 DIAGNOSIS — Z933 Colostomy status: Secondary | ICD-10-CM | POA: Diagnosis not present

## 2019-12-31 DIAGNOSIS — R32 Unspecified urinary incontinence: Secondary | ICD-10-CM | POA: Diagnosis not present

## 2019-12-31 DIAGNOSIS — S24103D Unspecified injury at T7-T10 level of thoracic spinal cord, subsequent encounter: Secondary | ICD-10-CM | POA: Diagnosis not present

## 2019-12-31 DIAGNOSIS — G8220 Paraplegia, unspecified: Secondary | ICD-10-CM | POA: Diagnosis not present

## 2020-01-02 ENCOUNTER — Ambulatory Visit: Payer: Medicaid Other | Attending: Family Medicine

## 2020-01-02 DIAGNOSIS — Z23 Encounter for immunization: Secondary | ICD-10-CM

## 2020-01-02 NOTE — Progress Notes (Signed)
   Covid-19 Vaccination Clinic  Name:  Theon Sobotka    MRN: [id number] DOB: 1976-01-23  01/02/2020  Mr. Ponce-Medina was observed post Covid-19 immunization for 30 minutes based on pre-vaccination screening without incident. He was provided with Vaccine Information Sheet and instruction to access the V-Safe system.   Mr. Odea was instructed to call 911 with any severe reactions post vaccine: Marland Kitchen Difficulty breathing  . Swelling of face and throat  . A fast heartbeat  . A bad rash all over body  . Dizziness and weakness   Immunizations Administered    Name Date Dose VIS Date Route   Pfizer COVID-19 Vaccine 01/02/2020  2:43 PM 0.3 mL 09/04/2019 Intramuscular   Manufacturer: [employer], [employer]   Lot: [id number]   NDC: [address]-8

## 2020-01-26 ENCOUNTER — Ambulatory Visit: Payer: Medicaid Other | Attending: Internal Medicine

## 2020-01-26 DIAGNOSIS — Z23 Encounter for immunization: Secondary | ICD-10-CM

## 2020-01-26 NOTE — Progress Notes (Signed)
   Covid-19 Vaccination Clinic  Name:  Dail Lerew    MRN: [id number] DOB: Nov 20, 1975  01/26/2020  Mr. Ponce-Medina was observed post Covid-19 immunization for 15 minutes without incident. He was provided with Vaccine Information Sheet and instruction to access the V-Safe system.   Mr. Seitzinger was instructed to call 911 with any severe reactions post vaccine: Marland Kitchen Difficulty breathing  . Swelling of face and throat  . A fast heartbeat  . A bad rash all over body  . Dizziness and weakness   Immunizations Administered    Name Date Dose VIS Date Route   Pfizer COVID-19 Vaccine 01/26/2020  2:05 PM 0.3 mL 11/18/2018 Intramuscular   Manufacturer: [employer], [employer]   Lot: [id number]   NDC: [address]-2

## 2020-01-29 ENCOUNTER — Encounter: Payer: Self-pay | Admitting: *Deleted

## 2020-02-03 ENCOUNTER — Telehealth: Payer: Self-pay | Admitting: Radiation Oncology

## 2020-02-03 NOTE — Telephone Encounter (Signed)
Pls contact stephanie with [employer] [phone] option 2

## 2020-02-05 NOTE — Telephone Encounter (Signed)
Received faxed CMNs for ostomy supplies from [place] at [facility]). Placed in PCP's box for signatures. Hubbard Hartshorn, BSN, RN-BC

## 2020-02-10 ENCOUNTER — Other Ambulatory Visit: Payer: Self-pay | Admitting: Internal Medicine

## 2020-02-10 DIAGNOSIS — M792 Neuralgia and neuritis, unspecified: Secondary | ICD-10-CM

## 2020-02-15 NOTE — Telephone Encounter (Signed)
pls contact [facility] regarding patient

## 2020-02-16 ENCOUNTER — Encounter: Payer: Self-pay | Admitting: Internal Medicine

## 2020-02-16 ENCOUNTER — Other Ambulatory Visit: Payer: Self-pay

## 2020-02-16 ENCOUNTER — Ambulatory Visit: Payer: Medicaid Other | Admitting: Internal Medicine

## 2020-02-16 DIAGNOSIS — R0789 Other chest pain: Secondary | ICD-10-CM | POA: Diagnosis not present

## 2020-02-16 DIAGNOSIS — Z0000 Encounter for general adult medical examination without abnormal findings: Secondary | ICD-10-CM

## 2020-02-16 DIAGNOSIS — I10 Essential (primary) hypertension: Secondary | ICD-10-CM | POA: Diagnosis not present

## 2020-02-16 NOTE — Assessment & Plan Note (Signed)
Patient has received his COVID 19 vaccinations

## 2020-02-16 NOTE — Progress Notes (Signed)
Internal Medicine Clinic Attending  Case discussed with Dr. Aslam at the time of the visit.  We reviewed the resident's history and exam and pertinent patient test results.  I agree with the assessment, diagnosis, and plan of care documented in the resident's note.  

## 2020-02-16 NOTE — Addendum Note (Signed)
Addended by: Blanch Media A on: 02/16/2020 02:20 PM   Modules accepted: Level of Service

## 2020-02-16 NOTE — Assessment & Plan Note (Signed)
Patient presenting with 2 days of left-sided chest pain of acute onset.  Patient denies any inciting factors and notes that he was sitting outside when he initially noticed a sharp pain at the left breast that was intermittent in nature.  He denies any radiation of this pain. He notes that on Sunday night it gradually worsened.  Not associated with position, however is worsened with deep breathing.  He notes trying BenGay yesterday which provides some relief.  He denies any fevers/chills, shortness of breath, recent URI symptoms.  On examination, there is point tenderness to palpation at the left 4th-5th rib in the midclavicular line.  Although patient does have gynecomastia, no breast masses palpated on examination.  Cardiovascular and pulmonary examination is benign.  Given that he did recently get some relief with BenGay, suspect that his pain is musculoskeletal in nature versus costochondritis.  Unlikely that his pain is cardiac or pulmonary in nature given history and exam findings.  Plan: Patient recommended for conservative management with heating pad, BenGay or Voltaren gel, and NSAIDs. Follow-up with PCP in 4 weeks

## 2020-02-16 NOTE — Patient Instructions (Addendum)
[name],  It was a pleasure seeing you in clinic. Today we discussed:   Left breast pain: At this time, it seems like this is musculoskeletal in nature. Please use heating pad to the area, Bengay or voltaren gel, and ibuprofen or advil as needed for pain. If the character of your pain changes or you develop shortness of breath or other concerning symptoms, please let us know.   If you have any questions or concerns, please call our clinic at [phone] between 9am-5pm and after hours call [phone] and ask for the internal medicine resident on call. If you feel you are having a medical emergency please call 911.   Thank you, we look forward to helping you remain healthy!

## 2020-02-16 NOTE — Assessment & Plan Note (Signed)
Blood pressure 125/79 at this visit.  Patient is compliant with regimen of amlodipine, hydrochlorothiazide, lisinopril.  Plan Continue amlodipine 10 mg daily Hydrochlorothiazide 25 mg daily Lisinopril 40 mg daily Follow-up with PCP in 4 weeks
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
## 2020-02-17 NOTE — Telephone Encounter (Signed)
CMN x 2 for ostomy supplies faxed to Shanda Bumps A at [facility] at [phone]. Kinnie Feil, BSN, RN-BC

## 2020-02-19 DIAGNOSIS — G8220 Paraplegia, unspecified: Secondary | ICD-10-CM | POA: Diagnosis not present

## 2020-02-19 DIAGNOSIS — R32 Unspecified urinary incontinence: Secondary | ICD-10-CM | POA: Diagnosis not present

## 2020-02-19 DIAGNOSIS — Z933 Colostomy status: Secondary | ICD-10-CM | POA: Diagnosis not present

## 2020-02-19 DIAGNOSIS — S24103D Unspecified injury at T7-T10 level of thoracic spinal cord, subsequent encounter: Secondary | ICD-10-CM | POA: Diagnosis not present

## 2020-03-15 ENCOUNTER — Ambulatory Visit: Payer: Medicaid Other | Admitting: Radiation Oncology

## 2020-03-15 ENCOUNTER — Other Ambulatory Visit: Payer: Self-pay | Admitting: Internal Medicine

## 2020-03-15 ENCOUNTER — Encounter: Payer: Self-pay | Admitting: Radiation Oncology

## 2020-03-15 VITALS — BP 152/78 | HR 95

## 2020-03-15 DIAGNOSIS — I10 Essential (primary) hypertension: Secondary | ICD-10-CM

## 2020-03-15 DIAGNOSIS — Z79891 Long term (current) use of opiate analgesic: Secondary | ICD-10-CM

## 2020-03-15 MED ORDER — TRAMADOL HCL 50 MG PO TABS: 50.0000 mg | ORAL_TABLET | Freq: Every evening | ORAL | 2 refills | Status: DC | PRN

## 2020-03-15 NOTE — Assessment & Plan Note (Signed)
Patient presenting for chronic pain follow up. Found to have BP of 150s/70s x2. Patient reports he took his medicines this morning. Wife states she recently got a blood pressure cuff.  Plan: -continue current medications -check blood pressures at home and record  -fu in 3 mo w/ new PCP

## 2020-03-15 NOTE — Patient Instructions (Addendum)
Thank you for coming to your appointment. It was so nice to see you. Today we discussed  Diagnoses and all orders for this visit:  Refill -     traMADol (ULTRAM) 50 MG tablet; Take 1 tablet (50 mg total) by mouth at bedtime as needed for up to 90 doses.  Blood Pressure:       -     Please check your blood pressure daily and let us know what those readings are at your follow up visit  If labs were drawn today, I will call you with any abnormal results. Otherwise, please keep up the good work. Please come back in 3 months to meet your new primary care doctor.  Sincerely,  Jenell Milliner, MD
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
## 2020-03-15 NOTE — Assessment & Plan Note (Signed)
Patient w/ hx of chronic abdominal pain presenting for refill of his tramadol. He has a contract dated 07/23/2017. I have last reviewed the [place] controlled substance database today (03/15/2020) and results are as expected.  Last drug screen 04/07/2019 via serum as patient cannot control his urine secondary to paraplegia. Drug screen negative but did not test for tramadol.  Will need to identify alternative screen for patient.  Plan: -refill tramadol 50 mg daily

## 2020-03-22 DIAGNOSIS — R32 Unspecified urinary incontinence: Secondary | ICD-10-CM | POA: Diagnosis not present

## 2020-03-22 DIAGNOSIS — Z933 Colostomy status: Secondary | ICD-10-CM | POA: Diagnosis not present

## 2020-03-22 DIAGNOSIS — S24103D Unspecified injury at T7-T10 level of thoracic spinal cord, subsequent encounter: Secondary | ICD-10-CM | POA: Diagnosis not present

## 2020-03-22 DIAGNOSIS — G8220 Paraplegia, unspecified: Secondary | ICD-10-CM | POA: Diagnosis not present

## 2020-03-24 NOTE — Progress Notes (Signed)
Internal Medicine Clinic Attending  Case discussed with Dr. Lanierat the time of the visit.  We reviewed the resident's history and exam and pertinent patient test results.  I agree with the assessment, diagnosis, and plan of care documented in the resident's note.   

## 2020-04-27 DIAGNOSIS — Z933 Colostomy status: Secondary | ICD-10-CM | POA: Diagnosis not present

## 2020-04-27 DIAGNOSIS — S24103D Unspecified injury at T7-T10 level of thoracic spinal cord, subsequent encounter: Secondary | ICD-10-CM | POA: Diagnosis not present

## 2020-04-27 DIAGNOSIS — G8220 Paraplegia, unspecified: Secondary | ICD-10-CM | POA: Diagnosis not present

## 2020-04-27 DIAGNOSIS — R32 Unspecified urinary incontinence: Secondary | ICD-10-CM | POA: Diagnosis not present

## 2020-06-01 ENCOUNTER — Other Ambulatory Visit: Payer: Self-pay

## 2020-06-01 ENCOUNTER — Ambulatory Visit (INDEPENDENT_AMBULATORY_CARE_PROVIDER_SITE_OTHER): Payer: Medicaid Other | Admitting: *Deleted

## 2020-06-01 DIAGNOSIS — Z23 Encounter for immunization: Secondary | ICD-10-CM | POA: Diagnosis present

## 2020-06-06 DIAGNOSIS — S24103D Unspecified injury at T7-T10 level of thoracic spinal cord, subsequent encounter: Secondary | ICD-10-CM | POA: Diagnosis not present

## 2020-06-06 DIAGNOSIS — Z933 Colostomy status: Secondary | ICD-10-CM | POA: Diagnosis not present

## 2020-06-06 DIAGNOSIS — R32 Unspecified urinary incontinence: Secondary | ICD-10-CM | POA: Diagnosis not present

## 2020-06-06 DIAGNOSIS — G8220 Paraplegia, unspecified: Secondary | ICD-10-CM | POA: Diagnosis not present

## 2020-06-08 ENCOUNTER — Other Ambulatory Visit: Payer: Self-pay

## 2020-06-08 ENCOUNTER — Ambulatory Visit (INDEPENDENT_AMBULATORY_CARE_PROVIDER_SITE_OTHER): Payer: Medicaid Other | Admitting: Internal Medicine

## 2020-06-08 ENCOUNTER — Encounter: Payer: Self-pay | Admitting: Internal Medicine

## 2020-06-08 DIAGNOSIS — H60501 Unspecified acute noninfective otitis externa, right ear: Secondary | ICD-10-CM

## 2020-06-08 DIAGNOSIS — Z79891 Long term (current) use of opiate analgesic: Secondary | ICD-10-CM | POA: Diagnosis not present

## 2020-06-08 MED ORDER — TRAMADOL HCL 50 MG PO TABS: 50.0000 mg | ORAL_TABLET | Freq: Every evening | ORAL | 2 refills | Status: DC | PRN

## 2020-06-08 MED ORDER — CIPRO HC 0.2-1 % OT SUSP: 4.0000 [drp] | Freq: Two times a day (BID) | OTIC | 0 refills | Status: AC

## 2020-06-08 NOTE — Patient Instructions (Signed)
To Mr. Leyton,   It was a pleasure meeting you today! Today we discussed your tramadol supply and your ear ache. We will make sure a prior authorization is authorized to get your usual dose of tramadol. For your ear ache, I will prescribe you an ointment to place in your ears two times daily. Please follow the instructions on the medication. Have a good day!  Sincerely,  Dolan Amen, MD

## 2020-06-09 ENCOUNTER — Telehealth: Payer: Self-pay | Admitting: *Deleted

## 2020-06-09 ENCOUNTER — Encounter: Payer: Self-pay | Admitting: Internal Medicine

## 2020-06-09 DIAGNOSIS — H6091 Unspecified otitis externa, right ear: Secondary | ICD-10-CM | POA: Insufficient documentation

## 2020-06-09 NOTE — Assessment & Plan Note (Signed)
Patient with PMH of chronic abdominal pain presents today for refill of his tramadol. Per the patient he has only been receiving his medication for 5 days instead of 30. I have reviewed PDMP and his narrative aligns. He likely needs a PA.   Patient needs medication to help control his chronic pain. Patient states that his tramadol is able to help with his daily activities.  - PA - Refill tramadol 50 mg daily.
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
## 2020-06-09 NOTE — Assessment & Plan Note (Addendum)
Patient presents with 3 day period of R ear pain. He denies fevers, nausea, vomiting, difficulty swallowing solids or liquids, or signs of systemic infection. No lymphadenopathy on PE. Ear canal is erythematous with an intact TM with no effusion or purulence. Consistent with otitis externa.  - Cipro HC for 7 days

## 2020-06-09 NOTE — Telephone Encounter (Signed)
Patient wife called in stating they only received 5 tabs of tramadol today as PA is required for Tramadol #30. Sending to Docia Barrier, BSN, RN-BC

## 2020-06-10 ENCOUNTER — Telehealth: Payer: Self-pay | Admitting: *Deleted

## 2020-06-10 NOTE — Telephone Encounter (Signed)
Information was sent to [facility] for PA for Tramadol.  Awaiting determination.  Angelina Ok, RN 06/10/2020 3:54 PM.

## 2020-06-13 ENCOUNTER — Telehealth: Payer: Self-pay | Admitting: *Deleted

## 2020-06-13 NOTE — Telephone Encounter (Signed)
Information was sent ton 06/10/2020 for PA for Tramadol 50 mg tablets.  Approval received 04/09/2020 through 12/08/20. Felizardo Hoffmann, RN 04/12/2020.

## 2020-06-14 ENCOUNTER — Telehealth: Payer: Self-pay | Admitting: *Deleted

## 2020-06-14 NOTE — Telephone Encounter (Signed)
Patient's wife called stated they cannot get his Tramadol prescription because it was written by an Intern. Please call the Pharmacy ASAP so they can get his medicine.

## 2020-06-14 NOTE — Progress Notes (Signed)
Internal Medicine Clinic Attending  I saw and evaluated the patient.  I personally confirmed the key portions of the history and exam documented by Dr. Winters and I reviewed pertinent patient test results.  The assessment, diagnosis, and plan were formulated together and I agree with the documentation in the resident's note.  

## 2020-06-14 NOTE — Telephone Encounter (Signed)
Please call patient back in reference to his Tramadol Refill.

## 2020-06-14 NOTE — Telephone Encounter (Signed)
Called [facility] about Tramadol; the pharmacist stated everything has been straighten out;and be delivered by tomorrow.  I called pt's wife - informed of the above.

## 2020-06-15 ENCOUNTER — Other Ambulatory Visit: Payer: Self-pay | Admitting: Internal Medicine

## 2020-06-15 NOTE — Telephone Encounter (Signed)
PA was done and approved for patient.

## 2020-07-01 DIAGNOSIS — G8220 Paraplegia, unspecified: Secondary | ICD-10-CM | POA: Diagnosis not present

## 2020-07-01 DIAGNOSIS — Z933 Colostomy status: Secondary | ICD-10-CM | POA: Diagnosis not present

## 2020-07-01 DIAGNOSIS — S24103D Unspecified injury at T7-T10 level of thoracic spinal cord, subsequent encounter: Secondary | ICD-10-CM | POA: Diagnosis not present

## 2020-07-01 DIAGNOSIS — R32 Unspecified urinary incontinence: Secondary | ICD-10-CM | POA: Diagnosis not present

## 2020-07-04 DIAGNOSIS — S24103D Unspecified injury at T7-T10 level of thoracic spinal cord, subsequent encounter: Secondary | ICD-10-CM | POA: Diagnosis not present

## 2020-07-04 DIAGNOSIS — Z933 Colostomy status: Secondary | ICD-10-CM | POA: Diagnosis not present

## 2020-07-04 DIAGNOSIS — R32 Unspecified urinary incontinence: Secondary | ICD-10-CM | POA: Diagnosis not present

## 2020-07-04 DIAGNOSIS — G8220 Paraplegia, unspecified: Secondary | ICD-10-CM | POA: Diagnosis not present

## 2020-07-05 DIAGNOSIS — G8220 Paraplegia, unspecified: Secondary | ICD-10-CM | POA: Diagnosis not present

## 2020-07-05 DIAGNOSIS — S24103D Unspecified injury at T7-T10 level of thoracic spinal cord, subsequent encounter: Secondary | ICD-10-CM | POA: Diagnosis not present

## 2020-07-05 DIAGNOSIS — R32 Unspecified urinary incontinence: Secondary | ICD-10-CM | POA: Diagnosis not present

## 2020-07-05 DIAGNOSIS — Z933 Colostomy status: Secondary | ICD-10-CM | POA: Diagnosis not present

## 2020-07-06 DIAGNOSIS — G8220 Paraplegia, unspecified: Secondary | ICD-10-CM | POA: Diagnosis not present

## 2020-07-06 DIAGNOSIS — R32 Unspecified urinary incontinence: Secondary | ICD-10-CM | POA: Diagnosis not present

## 2020-07-06 DIAGNOSIS — S24103D Unspecified injury at T7-T10 level of thoracic spinal cord, subsequent encounter: Secondary | ICD-10-CM | POA: Diagnosis not present

## 2020-07-06 DIAGNOSIS — Z933 Colostomy status: Secondary | ICD-10-CM | POA: Diagnosis not present

## 2020-07-15 ENCOUNTER — Telehealth: Payer: Self-pay

## 2020-07-15 NOTE — Telephone Encounter (Signed)
Please call pt back for gel overlay.

## 2020-07-22 NOTE — Telephone Encounter (Signed)
Pls contact regarding overlay mattress [phone]

## 2020-08-01 ENCOUNTER — Other Ambulatory Visit: Payer: Self-pay

## 2020-08-01 ENCOUNTER — Encounter: Payer: Self-pay | Admitting: Internal Medicine

## 2020-08-01 ENCOUNTER — Ambulatory Visit (INDEPENDENT_AMBULATORY_CARE_PROVIDER_SITE_OTHER): Payer: Medicaid Other | Admitting: Internal Medicine

## 2020-08-01 DIAGNOSIS — G8220 Paraplegia, unspecified: Secondary | ICD-10-CM

## 2020-08-01 NOTE — Progress Notes (Signed)
Internal Medicine Clinic Attending ° °Case discussed with Dr. Krienke  At the time of the visit.  We reviewed the resident’s history and exam and pertinent patient test results.  I agree with the assessment, diagnosis, and plan of care documented in the resident’s note.  °

## 2020-08-01 NOTE — Telephone Encounter (Signed)
Spoke with patient's wife regarding request for gel overlay mattress. States patient currently has a gel overlay that he's had for less than 5 years but it is worn out. States she spoke with [facility] and they need a Rx for this. CM sent to Charlton Amor at [facility] to see if patient will need F2F visit for this. Kinnie Feil, BSN, RN-BC

## 2020-08-01 NOTE — Telephone Encounter (Signed)
Spoke with patient's wife and scheduled this PM with Red Team for tele visit. Kinnie Feil, BSN, RN-BC
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
## 2020-08-01 NOTE — Telephone Encounter (Signed)
Rollene Fare, RN; Mariann Laster, Leory Plowman; 1 other   Please & Thank you!       Previous Messages   ----- Message -----  From: Fredderick Severance, RN  Sent: 08/01/2020  1:28 PM EST  To: Henderson Newcomer, Kandace Blitz, *  Subject: Gel Overlay Mattress               Hello!   I just spoke with patient's wife regarding request for gel overlay mattress. States patient currently has a gel overlay that he's had for less than 5 years but it is worn out. States she spoke with someone at [facility] and they need a Rx for this. Will he need a F2F visit for this as well?   Thanks!   Leotis Shames
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
## 2020-08-01 NOTE — Telephone Encounter (Signed)
Pls call back. TFC to Lauren D.

## 2020-08-04 NOTE — Telephone Encounter (Signed)
Pt's wife requesting to speak with a nurse. Please call back.

## 2020-08-05 NOTE — Telephone Encounter (Signed)
Rollene Fare, RN; Mariann Laster, Leory Plowman; 1 other   got it, thanks     ----- Message -----  From: Fredderick Severance, RN  Sent: 08/04/2020  3:38 PM EST  To: Henderson Newcomer, Kandace Blitz, *  Subject: RE: Gel Overlay Mattress             Hello,   The order is in for the gel overlay mattress. F2F was 08/01/2020. Thanks!

## 2020-08-09 DIAGNOSIS — S24103A Unspecified injury at T7-T10 level of thoracic spinal cord, initial encounter: Secondary | ICD-10-CM | POA: Diagnosis not present

## 2020-08-16 DIAGNOSIS — S24103D Unspecified injury at T7-T10 level of thoracic spinal cord, subsequent encounter: Secondary | ICD-10-CM | POA: Diagnosis not present

## 2020-08-16 DIAGNOSIS — G8220 Paraplegia, unspecified: Secondary | ICD-10-CM | POA: Diagnosis not present

## 2020-08-16 DIAGNOSIS — R32 Unspecified urinary incontinence: Secondary | ICD-10-CM | POA: Diagnosis not present

## 2020-08-16 DIAGNOSIS — Z933 Colostomy status: Secondary | ICD-10-CM | POA: Diagnosis not present

## 2020-08-24 DIAGNOSIS — S24103A Unspecified injury at T7-T10 level of thoracic spinal cord, initial encounter: Secondary | ICD-10-CM | POA: Diagnosis not present

## 2020-08-26 ENCOUNTER — Ambulatory Visit: Payer: Medicaid Other | Attending: Internal Medicine

## 2020-08-26 DIAGNOSIS — Z23 Encounter for immunization: Secondary | ICD-10-CM

## 2020-08-26 NOTE — Progress Notes (Signed)
   Covid-19 Vaccination Clinic  Name:  Cameron Zavala    MRN: [id number] DOB: April 14, 1976  08/26/2020  Cameron Zavala was observed post Covid-19 immunization for 15 minutes without incident. He was provided with Vaccine Information Sheet and instruction to access the V-Safe system.   Cameron Zavala was instructed to call 911 with any severe reactions post vaccine: Marland Kitchen Difficulty breathing  . Swelling of face and throat  . A fast heartbeat  . A bad rash all over body  . Dizziness and weakness   Immunizations Administered    Name Date Dose VIS Date Route   Pfizer COVID-19 Vaccine 08/26/2020  1:24 PM 0.3 mL 07/13/2020 Intramuscular   Manufacturer: [employer], [employer]   Lot: [id number]   NDC: [address]-5

## 2020-09-07 ENCOUNTER — Ambulatory Visit (INDEPENDENT_AMBULATORY_CARE_PROVIDER_SITE_OTHER): Payer: Medicaid Other | Admitting: Internal Medicine

## 2020-09-07 DIAGNOSIS — Z79891 Long term (current) use of opiate analgesic: Secondary | ICD-10-CM

## 2020-09-07 DIAGNOSIS — M792 Neuralgia and neuritis, unspecified: Secondary | ICD-10-CM | POA: Diagnosis present

## 2020-09-07 MED ORDER — TRAMADOL HCL 50 MG PO TABS: 50.0000 mg | ORAL_TABLET | Freq: Every evening | ORAL | 2 refills | Status: DC | PRN

## 2020-09-07 MED ORDER — GABAPENTIN 300 MG PO CAPS: 300.0000 mg | ORAL_CAPSULE | Freq: Three times a day (TID) | ORAL | 1 refills | Status: DC

## 2020-09-07 NOTE — Patient Instructions (Addendum)
To Mr. Klimaszewski,  It was a pleasure seeing you again today! Today we discussed your back pain and pain medication. Your back pain appears to be muscle based after physical examination. We will refill your gabapentin, please take as prescribed. Additionally, we have refilled your tramadol. Please call if you are not given the full prescription! Have a happy holiday season, and I will see you in 3 months time.  Sincerely,  Dolan Amen, MD
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
## 2020-09-09 ENCOUNTER — Encounter: Payer: Self-pay | Admitting: Internal Medicine

## 2020-09-09 NOTE — Assessment & Plan Note (Signed)
Patient recently ran out of his gabapentin, and has had continued neuropathic pain.  - Continue gabapentin  - Continue Tramadol
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
## 2020-09-09 NOTE — Progress Notes (Signed)
Internal Medicine Clinic Attending ? ?Case discussed with Dr. Winters  At the time of the visit.  We reviewed the resident?s history and exam and pertinent patient test results.  I agree with the assessment, diagnosis, and plan of care documented in the resident?s note.  ?

## 2020-10-25 DIAGNOSIS — S24103A Unspecified injury at T7-T10 level of thoracic spinal cord, initial encounter: Secondary | ICD-10-CM | POA: Diagnosis not present

## 2020-11-24 ENCOUNTER — Other Ambulatory Visit: Payer: Self-pay

## 2020-11-24 ENCOUNTER — Encounter: Payer: Self-pay | Admitting: Internal Medicine

## 2020-11-24 ENCOUNTER — Ambulatory Visit (INDEPENDENT_AMBULATORY_CARE_PROVIDER_SITE_OTHER): Payer: Medicaid Other | Admitting: Internal Medicine

## 2020-11-24 DIAGNOSIS — M792 Neuralgia and neuritis, unspecified: Secondary | ICD-10-CM | POA: Diagnosis not present

## 2020-11-24 DIAGNOSIS — Z79891 Long term (current) use of opiate analgesic: Secondary | ICD-10-CM

## 2020-11-24 DIAGNOSIS — Z939 Artificial opening status, unspecified: Secondary | ICD-10-CM

## 2020-11-24 DIAGNOSIS — I10 Essential (primary) hypertension: Secondary | ICD-10-CM | POA: Diagnosis not present

## 2020-11-24 MED ORDER — GABAPENTIN 300 MG PO CAPS: 300.0000 mg | ORAL_CAPSULE | Freq: Three times a day (TID) | ORAL | 1 refills | Status: AC

## 2020-11-24 MED ORDER — TRAMADOL HCL 50 MG PO TABS: 50.0000 mg | ORAL_TABLET | Freq: Every evening | ORAL | 2 refills | Status: AC | PRN

## 2020-11-24 MED ORDER — LISINOPRIL 40 MG PO TABS: 40.0000 mg | ORAL_TABLET | Freq: Every day | ORAL | 4 refills | Status: AC

## 2020-11-24 MED ORDER — HYDROCHLOROTHIAZIDE 25 MG PO TABS: 25.0000 mg | ORAL_TABLET | Freq: Every day | ORAL | 4 refills | Status: AC

## 2020-11-24 MED ORDER — AMLODIPINE BESYLATE 10 MG PO TABS: 10.0000 mg | ORAL_TABLET | Freq: Every day | ORAL | 4 refills | Status: AC

## 2020-11-24 NOTE — Patient Instructions (Signed)
Mr. Deemer,  It was a pleasure seeing you again! Today we discussed your colostomy and feces in your diaper. I am concerned for a fistula that could be connecting from your intestine to your anus. I am going to reach out to our radiologist to see what the optimum imaging will be to assess your situation. Based on your imaging, we will find out our next steps. I have also refilled your medications and they should be available at your pharmacy. I will see you back in 3 month's time.  Have a good day! Dolan Amen, MD

## 2020-11-24 NOTE — Assessment & Plan Note (Signed)
Patient with BP here today of:  Vitals with BMI 11/24/2020 11/24/2020 09/07/2020  Height - 5\' 4"  -  Weight - (No Data) (No Data)  BMI - - -  Systolic 135 140  Diastolic 79 78 73  Pulse 78 81 79   His BP is well maintained, but is running low on his BP medications. Will refill current regimen.  - Continue Norvasc 10 mg daily - Continue HCTZ 25 mg daily  - Continue Zestril 40 mg daily
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
## 2020-11-25 DIAGNOSIS — Z939 Artificial opening status, unspecified: Secondary | ICD-10-CM | POA: Insufficient documentation

## 2020-11-25 LAB — BMP8+ANION GAP
Anion Gap: 17 mmol/L (ref 10.0–18.0)
BUN/Creatinine Ratio: 17 (ref 9–20)
BUN: 16 mg/dL (ref 6–24)
CO2: 23 mmol/L (ref 20–29)
Calcium: 9.6 mg/dL (ref 8.7–10.2)
Chloride: 96 mmol/L (ref 96–106)
Creatinine, Ser: 0.93 mg/dL (ref 0.76–1.27)
Glucose: 182 mg/dL — ABNORMAL HIGH (ref 65–99)
Potassium: 4.4 mmol/L (ref 3.5–5.2)
Sodium: 136 mmol/L (ref 134–144)
eGFR: 104 mL/min/{1.73_m2} (ref >59)

## 2020-11-25 NOTE — Assessment & Plan Note (Addendum)
Patient presents with concerns of having 2 episodes of stool in his diaper despite having an ostomy. His ostomy was placed 3 years ago.   He states that two months ago, he was having diarrhea from his ostomy site. Shortly after, he noticed liquid in his diaper that was the same color and consistency as the diarrhea from his ostomy site.   His second episode occurred approximately a month later. This time, it was a normal stool, and shortly thereafter found to have stool in his diaper as well.   A/P:  Patient with an ostomy placed 3 years ago presents with concerns for stool in his diaper despite having an ostomy. His ostomy site is intact on physical examination. There is no erythema, purulence, drainage at the site itself. His diaper is currently dry with no contents inside. No obvious signs of drainage on examination. Usually in the acute phase of ostomy placement it is not unheard of for there to be residual stool leakage, but his ostomy has been in place for 3 years. Concern for possible fistula. Given this information, will call radiology to assist for proper imaging modality.  - Reach out to Radiology for assistance with imaging.  - Instructed patient that if he begins to spike fevers, notice change in mental status, or other signs of infection to immediately present to the ED.   Addendum:  Spoke with radiology, recommended CT abd/pelvis w/ IV contrast. Updated patient on the phone.

## 2020-11-25 NOTE — Assessment & Plan Note (Signed)
Patient is on his last refill for his gabapentin for his neuropathic R flank pain. Patient states that his current dose of gabapentin of 300 mg TID helps alleviate the pain.  - Refill Gabapentin 300 mg TID

## 2020-11-25 NOTE — Progress Notes (Signed)
Internal Medicine Clinic Attending ? ?Case discussed with Dr. Winters  At the time of the visit.  We reviewed the resident?s history and exam and pertinent patient test results.  I agree with the assessment, diagnosis, and plan of care documented in the resident?s note.  ?
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
## 2020-11-30 NOTE — Addendum Note (Signed)
Addended by: Dolan Amen C on: 11/30/2020 10:54 AM   Modules accepted: Orders

## 2020-12-08 DIAGNOSIS — H5213 Myopia, bilateral: Secondary | ICD-10-CM | POA: Diagnosis not present

## 2020-12-08 DIAGNOSIS — H40013 Open angle with borderline findings, low risk, bilateral: Secondary | ICD-10-CM | POA: Diagnosis not present

## 2021-01-05 ENCOUNTER — Telehealth: Payer: Self-pay | Admitting: *Deleted

## 2021-01-05 NOTE — Telephone Encounter (Signed)
Patient's wife called in to schedule appt for patient and was transferred to Triage. States patient c/o 2 day hx of left sided CP going up into jaw and halfway down left arm. They do not have any transportation. Instructed wife to hang up and dial 911 for immediate evaluation by EMS and transport to ED. States she will.

## 2021-01-05 NOTE — Telephone Encounter (Signed)
I agree

## 2021-02-02 ENCOUNTER — Encounter: Payer: Self-pay | Admitting: Internal Medicine

## 2021-02-02 ENCOUNTER — Ambulatory Visit: Payer: Medicaid Other | Admitting: Internal Medicine

## 2021-02-02 ENCOUNTER — Other Ambulatory Visit: Payer: Self-pay

## 2021-02-02 ENCOUNTER — Encounter (HOSPITAL_COMMUNITY): Payer: Self-pay | Admitting: Cardiology

## 2021-02-02 ENCOUNTER — Emergency Department (HOSPITAL_COMMUNITY): Payer: Medicaid Other

## 2021-02-02 DIAGNOSIS — Z66 Do not resuscitate: Secondary | ICD-10-CM | POA: Diagnosis not present

## 2021-02-02 DIAGNOSIS — I444 Left anterior fascicular block: Secondary | ICD-10-CM | POA: Diagnosis present

## 2021-02-02 DIAGNOSIS — R072 Precordial pain: Secondary | ICD-10-CM | POA: Diagnosis not present

## 2021-02-02 DIAGNOSIS — K7200 Acute and subacute hepatic failure without coma: Secondary | ICD-10-CM | POA: Diagnosis not present

## 2021-02-02 DIAGNOSIS — R079 Chest pain, unspecified: Secondary | ICD-10-CM | POA: Diagnosis not present

## 2021-02-02 DIAGNOSIS — R570 Cardiogenic shock: Secondary | ICD-10-CM | POA: Diagnosis not present

## 2021-02-02 DIAGNOSIS — Z91048 Other nonmedicinal substance allergy status: Secondary | ICD-10-CM

## 2021-02-02 DIAGNOSIS — I214 Non-ST elevation (NSTEMI) myocardial infarction: Principal | ICD-10-CM

## 2021-02-02 DIAGNOSIS — Z87891 Personal history of nicotine dependence: Secondary | ICD-10-CM

## 2021-02-02 DIAGNOSIS — I2129 ST elevation (STEMI) myocardial infarction involving other sites: Secondary | ICD-10-CM | POA: Diagnosis not present

## 2021-02-02 DIAGNOSIS — R509 Fever, unspecified: Secondary | ICD-10-CM

## 2021-02-02 DIAGNOSIS — Z20822 Contact with and (suspected) exposure to covid-19: Secondary | ICD-10-CM | POA: Diagnosis not present

## 2021-02-02 DIAGNOSIS — Z993 Dependence on wheelchair: Secondary | ICD-10-CM

## 2021-02-02 DIAGNOSIS — R7989 Other specified abnormal findings of blood chemistry: Secondary | ICD-10-CM | POA: Diagnosis not present

## 2021-02-02 DIAGNOSIS — D72829 Elevated white blood cell count, unspecified: Secondary | ICD-10-CM | POA: Diagnosis not present

## 2021-02-02 DIAGNOSIS — N179 Acute kidney failure, unspecified: Secondary | ICD-10-CM | POA: Diagnosis not present

## 2021-02-02 DIAGNOSIS — I110 Hypertensive heart disease with heart failure: Secondary | ICD-10-CM | POA: Diagnosis not present

## 2021-02-02 DIAGNOSIS — Z79899 Other long term (current) drug therapy: Secondary | ICD-10-CM

## 2021-02-02 DIAGNOSIS — Z933 Colostomy status: Secondary | ICD-10-CM

## 2021-02-02 DIAGNOSIS — Z91018 Allergy to other foods: Secondary | ICD-10-CM | POA: Diagnosis not present

## 2021-02-02 DIAGNOSIS — E559 Vitamin D deficiency, unspecified: Secondary | ICD-10-CM | POA: Diagnosis present

## 2021-02-02 DIAGNOSIS — Z882 Allergy status to sulfonamides status: Secondary | ICD-10-CM | POA: Diagnosis not present

## 2021-02-02 DIAGNOSIS — I442 Atrioventricular block, complete: Secondary | ICD-10-CM | POA: Diagnosis present

## 2021-02-02 DIAGNOSIS — J810 Acute pulmonary edema: Secondary | ICD-10-CM | POA: Insufficient documentation

## 2021-02-02 DIAGNOSIS — E872 Acidosis: Secondary | ICD-10-CM | POA: Diagnosis present

## 2021-02-02 DIAGNOSIS — N319 Neuromuscular dysfunction of bladder, unspecified: Secondary | ICD-10-CM | POA: Diagnosis present

## 2021-02-02 DIAGNOSIS — I509 Heart failure, unspecified: Secondary | ICD-10-CM | POA: Diagnosis not present

## 2021-02-02 DIAGNOSIS — J9601 Acute respiratory failure with hypoxia: Secondary | ICD-10-CM | POA: Diagnosis present

## 2021-02-02 DIAGNOSIS — R000 Tachycardia, unspecified: Secondary | ICD-10-CM | POA: Diagnosis not present

## 2021-02-02 DIAGNOSIS — E785 Hyperlipidemia, unspecified: Secondary | ICD-10-CM | POA: Diagnosis not present

## 2021-02-02 DIAGNOSIS — G8220 Paraplegia, unspecified: Secondary | ICD-10-CM

## 2021-02-02 DIAGNOSIS — Z91010 Allergy to peanuts: Secondary | ICD-10-CM

## 2021-02-02 DIAGNOSIS — L89224 Pressure ulcer of left hip, stage 4: Secondary | ICD-10-CM | POA: Diagnosis present

## 2021-02-02 DIAGNOSIS — Z978 Presence of other specified devices: Secondary | ICD-10-CM

## 2021-02-02 DIAGNOSIS — I469 Cardiac arrest, cause unspecified: Secondary | ICD-10-CM | POA: Diagnosis not present

## 2021-02-02 DIAGNOSIS — I219 Acute myocardial infarction, unspecified: Secondary | ICD-10-CM

## 2021-02-02 DIAGNOSIS — Z888 Allergy status to other drugs, medicaments and biological substances status: Secondary | ICD-10-CM

## 2021-02-02 DIAGNOSIS — F32A Depression, unspecified: Secondary | ICD-10-CM | POA: Diagnosis present

## 2021-02-02 DIAGNOSIS — J9811 Atelectasis: Secondary | ICD-10-CM | POA: Diagnosis not present

## 2021-02-02 DIAGNOSIS — L89212 Pressure ulcer of right hip, stage 2: Secondary | ICD-10-CM | POA: Diagnosis present

## 2021-02-02 DIAGNOSIS — L219 Seborrheic dermatitis, unspecified: Secondary | ICD-10-CM | POA: Diagnosis present

## 2021-02-02 DIAGNOSIS — Z9109 Other allergy status, other than to drugs and biological substances: Secondary | ICD-10-CM

## 2021-02-02 DIAGNOSIS — N39498 Other specified urinary incontinence: Secondary | ICD-10-CM | POA: Diagnosis present

## 2021-02-02 DIAGNOSIS — M726 Necrotizing fasciitis: Secondary | ICD-10-CM | POA: Diagnosis present

## 2021-02-02 LAB — COMPREHENSIVE METABOLIC PANEL
ALT: 97 U/L — ABNORMAL HIGH (ref 0–44)
AST: 355 U/L — ABNORMAL HIGH (ref 15–41)
Albumin: 3.8 g/dL (ref 3.5–5.0)
Alkaline Phosphatase: 84 U/L (ref 38–126)
Anion gap: 13 (ref 5–15)
BUN: 37 mg/dL — ABNORMAL HIGH (ref 6–20)
CO2: 21 mmol/L — ABNORMAL LOW (ref 22–32)
Calcium: 8.3 mg/dL — ABNORMAL LOW (ref 8.9–10.3)
Chloride: 100 mmol/L (ref 98–111)
Creatinine, Ser: 2.25 mg/dL — ABNORMAL HIGH (ref 0.61–1.24)
GFR, Estimated: 36 mL/min — ABNORMAL LOW (ref >60)
Glucose, Bld: 209 mg/dL — ABNORMAL HIGH (ref 70–99)
Potassium: 4.2 mmol/L (ref 3.5–5.1)
Sodium: 134 mmol/L — ABNORMAL LOW (ref 135–145)
Total Bilirubin: 1.5 mg/dL — ABNORMAL HIGH (ref 0.3–1.2)
Total Protein: 7.2 g/dL (ref 6.5–8.1)

## 2021-02-02 LAB — I-STAT ARTERIAL BLOOD GAS, ED
Acid-base deficit: 8 mmol/L — ABNORMAL HIGH (ref 0.0–2.0)
Bicarbonate: 15.9 mmol/L — ABNORMAL LOW (ref 20.0–28.0)
Calcium, Ion: 1.01 mmol/L — ABNORMAL LOW (ref 1.15–1.40)
HCT: 44 % (ref 39.0–52.0)
Hemoglobin: 15 g/dL (ref 13.0–17.0)
O2 Saturation: 95 %
Patient temperature: 99.4
Potassium: 4.8 mmol/L (ref 3.5–5.1)
Sodium: 137 mmol/L (ref 135–145)
TCO2: 17 mmol/L — ABNORMAL LOW (ref 22–32)
pCO2 arterial: 29.8 mmHg — ABNORMAL LOW (ref 32.0–48.0)
pH, Arterial: 7.337 — ABNORMAL LOW (ref 7.350–7.450)
pO2, Arterial: 78 mmHg — ABNORMAL LOW (ref 83.0–108.0)

## 2021-02-02 LAB — CBC WITH DIFFERENTIAL/PLATELET
Abs Immature Granulocytes: 0.1 10*3/uL — ABNORMAL HIGH (ref 0.00–0.07)
Basophils Absolute: 0 10*3/uL (ref 0.0–0.1)
Basophils Relative: 0 %
Eosinophils Absolute: 0 10*3/uL (ref 0.0–0.5)
Eosinophils Relative: 0 %
HCT: 50.3 % (ref 39.0–52.0)
Hemoglobin: 16.3 g/dL (ref 13.0–17.0)
Immature Granulocytes: 1 %
Lymphocytes Relative: 5 %
Lymphs Abs: 0.9 10*3/uL (ref 0.7–4.0)
MCH: 30.6 pg (ref 26.0–34.0)
MCHC: 32.4 g/dL (ref 30.0–36.0)
MCV: 94.5 fL (ref 80.0–100.0)
Monocytes Absolute: 0.7 10*3/uL (ref 0.1–1.0)
Monocytes Relative: 4 %
Neutro Abs: 16.8 10*3/uL — ABNORMAL HIGH (ref 1.7–7.7)
Neutrophils Relative %: 90 %
Platelets: 210 10*3/uL (ref 150–400)
RBC: 5.32 MIL/uL (ref 4.22–5.81)
RDW: 14.3 % (ref 11.5–15.5)
WBC: 18.5 10*3/uL — ABNORMAL HIGH (ref 4.0–10.5)
nRBC: 0 % (ref 0.0–0.2)

## 2021-02-02 LAB — PROTIME-INR
INR: 1.1 (ref 0.8–1.2)
Prothrombin Time: 14.2 seconds (ref 11.4–15.2)

## 2021-02-02 LAB — TROPONIN I (HIGH SENSITIVITY)
Troponin I (High Sensitivity): >27000 ng/L (ref <18)
Troponin I (High Sensitivity): >27000 ng/L (ref <18)

## 2021-02-02 LAB — MAGNESIUM: Magnesium: 1.8 mg/dL (ref 1.7–2.4)

## 2021-02-02 LAB — LACTIC ACID, PLASMA
Lactic Acid, Venous: 4 mmol/L (ref 0.5–1.9)
Lactic Acid, Venous: 3.3 mmol/L (ref 0.5–1.9)

## 2021-02-02 LAB — CBC
HCT: 45.8 % (ref 39.0–52.0)
Hemoglobin: 14.7 g/dL (ref 13.0–17.0)
MCH: 30.3 pg (ref 26.0–34.0)
MCHC: 32.1 g/dL (ref 30.0–36.0)
MCV: 94.4 fL (ref 80.0–100.0)
Platelets: 221 10*3/uL (ref 150–400)
RBC: 4.85 MIL/uL (ref 4.22–5.81)
RDW: 14.2 % (ref 11.5–15.5)
WBC: 18.6 10*3/uL — ABNORMAL HIGH (ref 4.0–10.5)
nRBC: 0 % (ref 0.0–0.2)

## 2021-02-02 LAB — LIPID PANEL
Cholesterol: 245 mg/dL — ABNORMAL HIGH (ref 0–200)
HDL: 39 mg/dL — ABNORMAL LOW (ref >40)
LDL Cholesterol: 168 mg/dL — ABNORMAL HIGH (ref 0–99)
Total CHOL/HDL Ratio: 6.3 RATIO
Triglycerides: 189 mg/dL — ABNORMAL HIGH (ref <150)
VLDL: 38 mg/dL (ref 0–40)

## 2021-02-02 LAB — PHOSPHORUS: Phosphorus: 3.1 mg/dL (ref 2.5–4.6)

## 2021-02-02 LAB — RESP PANEL BY RT-PCR (FLU A&B, COVID) ARPGX2
Influenza A by PCR: NEGATIVE
Influenza B by PCR: NEGATIVE
SARS Coronavirus 2 by RT PCR: NEGATIVE

## 2021-02-02 LAB — HEMOGLOBIN A1C
Hgb A1c MFr Bld: 6.4 % — ABNORMAL HIGH (ref 4.8–5.6)
Mean Plasma Glucose: 136.98 mg/dL

## 2021-02-02 LAB — APTT: aPTT: 33 seconds (ref 24–36)

## 2021-02-02 LAB — HIV ANTIBODY (ROUTINE TESTING W REFLEX): HIV Screen 4th Generation wRfx: NONREACTIVE

## 2021-02-02 LAB — D-DIMER, QUANTITATIVE: D-Dimer, Quant: 0.41 ug/mL-FEU (ref 0.00–0.50)

## 2021-02-02 MED ORDER — NOREPINEPHRINE 4 MG/250ML-% IV SOLN
2.0000 ug/min | INTRAVENOUS | Status: DC
  Administered 2021-02-02: 2 ug/min via INTRAVENOUS
  Filled 2021-02-02 (×2): qty 250

## 2021-02-02 MED ORDER — SODIUM CHLORIDE 0.9 % IV SOLN
2.0000 g | Freq: Once | INTRAVENOUS | Status: AC
  Administered 2021-02-02: 2 g via INTRAVENOUS
  Filled 2021-02-02: qty 2

## 2021-02-02 MED ORDER — FUROSEMIDE 10 MG/ML IJ SOLN
40.0000 mg | Freq: Once | INTRAMUSCULAR | Status: AC
  Administered 2021-02-02: 40 mg via INTRAVENOUS
  Filled 2021-02-02: qty 4

## 2021-02-02 MED ORDER — NITROGLYCERIN 1 MG/10 ML FOR IR/CATH LAB
INTRA_ARTERIAL | Status: AC
  Filled 2021-02-02: qty 10

## 2021-02-02 MED ORDER — ASPIRIN 81 MG PO CHEW
81.0000 mg | CHEWABLE_TABLET | Freq: Every day | ORAL | Status: DC
  Administered 2021-02-02: 81 mg via ORAL
  Filled 2021-02-02 (×2): qty 1

## 2021-02-02 MED ORDER — VANCOMYCIN HCL 1000 MG/200ML IV SOLN: 1000.0000 mg | Freq: Once | INTRAVENOUS | Status: DC

## 2021-02-02 MED ORDER — HEPARIN SODIUM (PORCINE) 5000 UNIT/ML IJ SOLN
INTRAMUSCULAR | Status: AC
  Administered 2021-02-02: 4000 [IU]
  Filled 2021-02-02: qty 1

## 2021-02-02 MED ORDER — FENTANYL CITRATE (PF) 100 MCG/2ML IJ SOLN
INTRAMUSCULAR | Status: AC
  Administered 2021-02-03: 50 ug
  Filled 2021-02-02: qty 2

## 2021-02-02 MED ORDER — HEPARIN SODIUM (PORCINE) 5000 UNIT/ML IJ SOLN: 4000.0000 [IU] | Freq: Once | INTRAMUSCULAR | Status: AC

## 2021-02-02 MED ORDER — MIDAZOLAM HCL 2 MG/2ML IJ SOLN
INTRAMUSCULAR | Status: AC
  Filled 2021-02-02: qty 2

## 2021-02-02 MED ORDER — DOCUSATE SODIUM 100 MG PO CAPS: 100.0000 mg | ORAL_CAPSULE | Freq: Two times a day (BID) | ORAL | Status: DC | PRN

## 2021-02-02 MED ORDER — ASPIRIN 81 MG PO CHEW
324.0000 mg | CHEWABLE_TABLET | Freq: Once | ORAL | Status: AC
  Administered 2021-02-02: 324 mg via ORAL
  Filled 2021-02-02: qty 4

## 2021-02-02 MED ORDER — KETAMINE HCL 50 MG/5ML IJ SOSY
PREFILLED_SYRINGE | INTRAMUSCULAR | Status: AC
  Administered 2021-02-03: 50 mg
  Filled 2021-02-02: qty 5

## 2021-02-02 MED ORDER — SODIUM CHLORIDE 0.9 % IV BOLUS (SEPSIS)
1000.0000 mL | Freq: Once | INTRAVENOUS | Status: AC
  Administered 2021-02-02: 1000 mL via INTRAVENOUS

## 2021-02-02 MED ORDER — PANTOPRAZOLE SODIUM 40 MG IV SOLR
40.0000 mg | Freq: Every day | INTRAVENOUS | Status: DC
  Administered 2021-02-02: 40 mg via INTRAVENOUS
  Filled 2021-02-02: qty 40

## 2021-02-02 MED ORDER — POLYETHYLENE GLYCOL 3350 17 G PO PACK: 17.0000 g | PACK | Freq: Every day | ORAL | Status: DC | PRN

## 2021-02-02 MED ORDER — SODIUM CHLORIDE 0.9 % IV BOLUS
500.0000 mL | Freq: Once | INTRAVENOUS | Status: AC
  Administered 2021-02-02: 500 mL via INTRAVENOUS

## 2021-02-02 MED ORDER — SODIUM CHLORIDE 0.9 % IV SOLN: INTRAVENOUS | Status: DC

## 2021-02-02 MED ORDER — SODIUM CHLORIDE 0.9 % IV SOLN
2.0000 g | INTRAVENOUS | Status: DC
  Administered 2021-02-03: 2 g via INTRAVENOUS
  Filled 2021-02-02 (×3): qty 20

## 2021-02-02 MED ORDER — SODIUM CHLORIDE 0.9 % IV SOLN
250.0000 mL | INTRAVENOUS | Status: DC
Start: 2021-02-02 — End: 2021-02-03

## 2021-02-02 MED ORDER — ROCURONIUM BROMIDE 10 MG/ML (PF) SYRINGE
PREFILLED_SYRINGE | INTRAVENOUS | Status: AC
  Filled 2021-02-02: qty 10

## 2021-02-02 MED ORDER — SODIUM CHLORIDE 0.9 % IV SOLN
500.0000 mg | INTRAVENOUS | Status: DC
  Administered 2021-02-02: 500 mg via INTRAVENOUS
  Filled 2021-02-02 (×2): qty 500

## 2021-02-02 MED ORDER — ROSUVASTATIN CALCIUM 5 MG PO TABS
10.0000 mg | ORAL_TABLET | Freq: Every day | ORAL | Status: DC
  Administered 2021-02-02: 10 mg via ORAL
  Filled 2021-02-02: qty 2

## 2021-02-02 MED ORDER — VANCOMYCIN HCL 1500 MG/300ML IV SOLN
1500.0000 mg | Freq: Once | INTRAVENOUS | Status: AC
  Administered 2021-02-02: 1500 mg via INTRAVENOUS
  Filled 2021-02-02: qty 300

## 2021-02-02 MED ORDER — ETOMIDATE 2 MG/ML IV SOLN
INTRAVENOUS | Status: AC
  Filled 2021-02-02: qty 20

## 2021-02-02 MED ORDER — METRONIDAZOLE 500 MG/100ML IV SOLN
500.0000 mg | Freq: Once | INTRAVENOUS | Status: AC
  Administered 2021-02-02: 500 mg via INTRAVENOUS
  Filled 2021-02-02: qty 100

## 2021-02-02 MED ORDER — HEPARIN (PORCINE) 25000 UT/250ML-% IV SOLN
800.0000 [IU]/h | INTRAVENOUS | Status: DC
  Administered 2021-02-02: 800 [IU]/h via INTRAVENOUS
  Filled 2021-02-02: qty 250

## 2021-02-02 MED ORDER — NITROGLYCERIN 0.4 MG SL SUBL: 0.4000 mg | SUBLINGUAL_TABLET | SUBLINGUAL | Status: DC | PRN
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
## 2021-02-02 NOTE — Progress Notes (Signed)
eLink Physician-Brief Progress Note Patient Name: Cameron Zavala DOB: May 12, 1976 MRN: [id number]   Date of Service  02/09/2021  HPI/Events of Note  Nursing request for Foley catheter. Patient straight caths himself at home.   eICU Interventions  Plan: 1. I/O Cath PRN.     Intervention Category Major Interventions: Other:  Lenell Antu 01/22/2021, 8:07 PM

## 2021-02-02 NOTE — Sepsis Progress Note (Signed)
eLink is monitoring this Code Sepsis. °
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
## 2021-02-02 NOTE — Assessment & Plan Note (Addendum)
Mr.Hawke Ponce-Medina is a 45 y.o. with a history of hypertension, paraplegia, and hyperlipidemia who is presenting with shortness of breath, dry cough, chest pain, headaches, and feeling unwell for the past 2 days.  Chest pain is located over his left side, radiates down his left arm, is a pressure sensation, can last for up to 2 hours.  He has not tried anything for the pain.  He had called in 2 days ago with chest pain and had been advised to go to the ED, he states that they did not go because there is difficulty getting transportation.  He is currently having the chest pain and it is a 7 out of 10, states that it is worse in the morning.  He has never had pain like this before.  Vitals are significant for hyperglycemia with a BP of 86/61, tachycardia with heart rate of 111, and fever of 100.8.  Patient is awake, alert, oriented, and in no acute distress.  Patient is transferred directly to the emergency room for acute evaluation, will need to be evaluated for acute CAD and sepsis. -Transferred straight to the ED -Contacted charge nurse in ED, Cailie, and informed of patient being transferred, informed of chest pain, hypertension, and tachycardia

## 2021-02-02 NOTE — ED Notes (Signed)
Patient states that he is having a hard time breathing. Admitting provider at bedside. This RN placed patient on 2L [place]
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
## 2021-02-02 NOTE — ED Provider Notes (Signed)
Emergency Medicine Provider Triage Evaluation Note  Cameron Zavala , a 45 y.o. male  was evaluated in triage.  Pt complains of chest pain to the left side of the chest that started 2 days, sent by pcp.  Review of Systems  Positive: Chest pain, cough Negative: vomiting  Physical Exam  There were no vitals taken for this visit. Gen:   Awake, no distress   Resp:  Normal effort  MSK:   Moves extremities without difficulty  Other:  tachycardic  Medical Decision Making  Medically screening exam initiated at 3:54 PM.  Appropriate orders placed.  Cameron Zavala was informed that the remainder of the evaluation will be completed by another provider, this initial triage assessment does not replace that evaluation, and the importance of remaining in the ED until their evaluation is complete.  EKG reading as stemi, this was reviewed by both Dr. Charm Barges and Dr. Adela Lank and they determined this did not meet stemi criteria however they will page cardiology and pt will be taken to a room immediately.    Karrie Meres, PA-C 2021-02-21 1600    Derwood Kaplan, MD 01/28/2021 [phone]
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
## 2021-02-02 NOTE — ED Notes (Signed)
Critical Troponin >27,000 told to Long MD
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
## 2021-02-02 NOTE — ED Notes (Signed)
Paged Dr. Everardo All per RN Daija's request for consult.

## 2021-02-02 NOTE — Progress Notes (Signed)
eLink Physician-Brief Progress Note Patient Name: Cameron Zavala DOB: Feb 17, 1976 MRN: [id number]   Date of Service  01/29/2021  HPI/Events of Note  Notified that patient requires repeat Lactic Acid per code sepsis policy.   eICU Interventions  Will order Lactic Acid now and again in 3 hours.      Intervention Category Major Interventions: Acid-Base disturbance - evaluation and management  Lenell Antu 02/19/2021, 10:51 PM

## 2021-02-02 NOTE — Progress Notes (Signed)
RT transported pt from ED to 2H 20 without event. 2H RT aware.

## 2021-02-02 NOTE — ED Notes (Signed)
Provider made aware of patient being hypotensive after bolus. bp 73/63.

## 2021-02-02 NOTE — Progress Notes (Signed)
Spoke with ED RN and requested repeat LA. RN said she would send one. Awaiting results.   Sunny Schlein Sharlyne Koeneman DNP Elink RN 2023 PM
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
## 2021-02-02 NOTE — Progress Notes (Signed)
Code Sepsis Completion Note:  Pt last LA was 3.3, awaiting third LA. Had BC drawn and ABX. Received IVF and admitted to ICU for further monitoring.   Ami Thornsberry DNP Elink RN [phone] PM

## 2021-02-02 NOTE — ED Notes (Signed)
Report given to Lauren RN.
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
## 2021-02-02 NOTE — ED Notes (Signed)
This NT attempted I&o cath x1. Met resistance. RN Daija attempted x1. Resistance met. Attempted condom catheter but wont stay on. Diaper put on patient. 

## 2021-02-02 NOTE — Progress Notes (Signed)
eLink Physician-Brief Progress Note Patient Name: Srihith Aquilino DOB: 1975/10/23 MRN: [id number]   Date of Service  02/26/21  HPI/Events of Note  Patient on maximum dose of Norepinephrine IV infusion via PIV. Remains hypotensive with BP = 74/57.  eICU Interventions  Plan: 1. Place coude catheter. 2. Will request that PCCM ground team place CVL.  3. Monitor CVP when CVL placed and Q 4 hours.      Intervention Category Major Interventions: Other:;Hypotension - evaluation and management  Lenell Antu 2021/02/26, 10:32 PM

## 2021-02-02 NOTE — ED Notes (Signed)
Code Stemi activated at 16:04, called in by [facility], per Dr. Jacqulyn Bath instructions

## 2021-02-02 NOTE — ED Triage Notes (Signed)
Pt presents to triage from clinic downstairs with chest pain. Pt has had L sided chest pain x 2 days, has had cough, shortness of breath and nausea. Pt has had L headache and arm pain x 3 weeks.  Pt received EKG on arrival, possible STEMI, pt to room via wheelchair.

## 2021-02-02 NOTE — Progress Notes (Signed)
ELINK Code Sepsis follow up note:  Asked eMD to order repeat LA since it was not drawn in the ED and still awaiting result.   Cameron Kley DNP Elink RN [phone] PM

## 2021-02-02 NOTE — ED Notes (Signed)
Lactic 4.0 told to long MD

## 2021-02-03 ENCOUNTER — Inpatient Hospital Stay (HOSPITAL_COMMUNITY): Payer: Medicaid Other

## 2021-02-03 DIAGNOSIS — J90 Pleural effusion, not elsewhere classified: Secondary | ICD-10-CM | POA: Diagnosis not present

## 2021-02-03 DIAGNOSIS — R21 Rash and other nonspecific skin eruption: Secondary | ICD-10-CM | POA: Diagnosis not present

## 2021-02-03 LAB — POCT I-STAT 7, (LYTES, BLD GAS, ICA,H+H)
Acid-base deficit: 24 mmol/L — ABNORMAL HIGH (ref 0.0–2.0)
Bicarbonate: 6 mmol/L — ABNORMAL LOW (ref 20.0–28.0)
Calcium, Ion: 1.31 mmol/L (ref 1.15–1.40)
HCT: 41 % (ref 39.0–52.0)
Hemoglobin: 13.9 g/dL (ref 13.0–17.0)
O2 Saturation: 100 %
Patient temperature: 36.6
Potassium: 4.4 mmol/L (ref 3.5–5.1)
Sodium: 139 mmol/L (ref 135–145)
TCO2: 7 mmol/L — ABNORMAL LOW (ref 22–32)
pCO2 arterial: 25.2 mmHg — ABNORMAL LOW (ref 32.0–48.0)
pH, Arterial: 6.985 — CL (ref 7.350–7.450)
pO2, Arterial: 266 mmHg — ABNORMAL HIGH (ref 83.0–108.0)

## 2021-02-03 LAB — LACTIC ACID, PLASMA: Lactic Acid, Venous: 8.9 mmol/L (ref 0.5–1.9)

## 2021-02-03 LAB — MRSA PCR SCREENING: MRSA by PCR: NEGATIVE

## 2021-02-03 MED ORDER — CALCIUM CHLORIDE 10 % IV SOLN: 1.0000 g | Freq: Once | INTRAVENOUS | Status: AC

## 2021-02-03 MED ORDER — SODIUM CHLORIDE 0.9 % IV SOLN
1.0000 mg/kg/h | INTRAVENOUS | Status: DC
  Administered 2021-02-03: 0.5 mg/kg/h via INTRAVENOUS
  Filled 2021-02-03 (×2): qty 5

## 2021-02-03 MED ORDER — KETAMINE HCL 50 MG/5ML IJ SOSY
PREFILLED_SYRINGE | INTRAMUSCULAR | Status: AC
  Administered 2021-02-03: 50 mg
  Filled 2021-02-03: qty 10

## 2021-02-03 MED ORDER — VASOPRESSIN 20 UNITS/100 ML INFUSION FOR SHOCK
0.0000 [IU]/min | INTRAVENOUS | Status: DC
  Administered 2021-02-03: 0.04 [IU]/min via INTRAVENOUS
  Filled 2021-02-03: qty 100

## 2021-02-03 MED ORDER — CALCIUM CHLORIDE 10 % IV SOLN
INTRAVENOUS | Status: AC
  Administered 2021-02-03: 1 g via INTRAVENOUS
  Filled 2021-02-03: qty 10

## 2021-02-03 MED ORDER — MILRINONE LACTATE IN DEXTROSE 20-5 MG/100ML-% IV SOLN
0.7500 ug/kg/min | INTRAVENOUS | Status: DC
  Administered 2021-02-03: 0.75 ug/kg/min via INTRAVENOUS

## 2021-02-03 MED ORDER — STERILE WATER FOR INJECTION IV SOLN
INTRAVENOUS | Status: DC
  Filled 2021-02-03: qty 1000

## 2021-02-03 MED ORDER — SODIUM CHLORIDE 0.9 % IV SOLN
1.0000 g | Freq: Once | INTRAVENOUS | Status: DC
  Filled 2021-02-03: qty 10

## 2021-02-03 MED ORDER — KETAMINE HCL 50 MG/5ML IJ SOSY
PREFILLED_SYRINGE | INTRAMUSCULAR | Status: AC
  Administered 2021-02-03: 50 mg
  Filled 2021-02-03: qty 15

## 2021-02-03 MED ORDER — NOREPINEPHRINE 4 MG/250ML-% IV SOLN
0.0000 ug/min | INTRAVENOUS | Status: DC
  Administered 2021-02-03 (×2): 40 ug/min via INTRAVENOUS
  Filled 2021-02-03: qty 250

## 2021-02-03 MED ORDER — FENTANYL CITRATE (PF) 100 MCG/2ML IJ SOLN
INTRAMUSCULAR | Status: AC
  Filled 2021-02-03: qty 2

## 2021-02-03 MED ORDER — EPINEPHRINE HCL 5 MG/250ML IV SOLN IN NS
INTRAVENOUS | Status: AC
  Filled 2021-02-03: qty 250

## 2021-02-03 MED ORDER — SODIUM BICARBONATE 8.4 % IV SOLN
INTRAVENOUS | Status: AC
  Administered 2021-02-03: 50 meq
  Filled 2021-02-03: qty 150

## 2021-02-03 MED ORDER — EPINEPHRINE HCL 5 MG/250ML IV SOLN IN NS
INTRAVENOUS | Status: AC
  Administered 2021-02-03: 4 mg
  Filled 2021-02-03: qty 250

## 2021-02-03 MED ORDER — PROPOFOL 1000 MG/100ML IV EMUL
INTRAVENOUS | Status: AC
  Filled 2021-02-03: qty 100

## 2021-02-03 MED ORDER — EPINEPHRINE HCL 5 MG/250ML IV SOLN IN NS: 0.5000 ug/min | INTRAVENOUS | Status: DC

## 2021-02-05 NOTE — Progress Notes (Signed)
Internal Medicine Clinic Attending  Case discussed with Dr. Gwyneth Revels  At the time of the visit.  We reviewed the resident's history and exam and pertinent patient test results.  I agree with the assessment and plan of care documented in the resident's note, particularly her recognition that emergent care was appropriate warranting transfer immediately to ED.

## 2021-02-07 LAB — CULTURE, BLOOD (ROUTINE X 2)
Culture: NO GROWTH
Culture: NO GROWTH
Culture: NO GROWTH
Special Requests: ADEQUATE

## 2021-02-22 ENCOUNTER — Encounter: Payer: Medicaid Other | Admitting: Internal Medicine

## 2021-02-22 NOTE — Op Note (Signed)
Intubation Procedure Note  Cameron Zavala  [id number]  1976/01/13  Date:02-08-21  Time:1:31 AM   Provider Performing:Nehal Witting R Christorpher Hisaw    Procedure: Intubation (31500)  Indication(s) Respiratory Failure  Consent Unable to obtain consent due to emergent nature of procedure.  Verbal consent was obtained from the patient prior to intubation.   Anesthesia Ketamine   Time Out Verified patient identification, verified procedure, site/side was marked, verified correct patient position, special equipment/implants available, medications/allergies/relevant history reviewed, required imaging and test results available.   Sterile Technique Usual hand hygeine, masks, and gloves were used   Procedure Description Patient positioned in bed supine.  Sedation given as noted above.  Patient was intubated with endotracheal tube using #3 Miller blade.  View was Grade 1 full glottis Due to the short neck a bougie was used to introduce the endotracheal tube into the airway.  This was performed without difficulty..  Number of attempts was 1.  Colorimetric CO2 detector was consistent with tracheal placement.   Complications/Tolerance None; patient tolerated the procedure well. Chest X-ray is ordered to verify placement.   EBL None   Specimen(s) None

## 2021-02-22 NOTE — Significant Event (Addendum)
At bedside patient went bradycardic to asystolic. Did not code patient based off recent conversation with wife.   No audible heart sounds No central pulses auscultated/palpated Pupils fixed and non reactive No audible lung sounds once ventilatory support discontinued  TOD 0320   Wife notified.

## 2021-02-22 NOTE — Progress Notes (Signed)
Pt arrived from ED around 2145 on of Levo at the time. VSS. On BIpap. Pt's BP progressively decreasing with only PIVS and maxed out on Levo . Elink notified and ground team CCM arrived to place line. Pt becoming more diaphoretic and pt having increased chest pressure, obtained EKG and given to MD. Cardiology consulted and at bedside. PA catheter placed by MD. Emergently intubated, ketamine used for sedation purposes. Unable to obtain foley as 4+ nurses tried, MD aware. Bladder scan x2 performed and both times under .  Pt increased decompensation, maxed on epi, levo, and vaso. Family notified and made pt DNR.  TOD 0320.

## 2021-02-22 NOTE — Progress Notes (Signed)
02/04/2021:  Wife notified of patient's belongings still being on [facility]. She specifically asked about his watch and cell phone as well as his rings.  All belongings went through and found the watch and cell phone.  After talking with the RN that took care of patient, I let the wife know that his rings went down with him to the morgue still on his fingers.  Please note that there was also a gun with the wheelchair that Security came up and took possession of.  Wife made aware of gun being in security and that she would have to pick it up down there when she came for his wheelchair and belongings. All questions answered and patient placement's number given to wife again so that she could let them know of funeral home once it was decided on.

## 2021-02-22 NOTE — Progress Notes (Signed)
Critical care event note.  Called to bedside to place triple-lumen catheter.  On arrival the patient appeared to be uncomfortable and through conversation determined the patient had 7/10 chest pressure.  He also become very diaphoretic and had increasing vasoactive support requirements.  Emergent bedside echocardiogram showed ejection fraction approximately 10 to 15%.  There was global hypokinesis of the LV and RV seen.  Repeat EKG now shows complete heart block compared to left anterior fascicular block which she had at the time of admission.  Patient is in profound cardiogenic shock.  Assessment: Profound cardiogenic shock NSTEMI AKI Mild shock liver Pulmonary edema  Plan: Bedside echocardiogram was performed showing EF of 10 to 15%. Discussed the case with cardiologist Dr.Carlise who immediately came to the bedside. PA catheter was placed.  Cardiac index of 1.3 while on epinephrine and 0.25 of milrinone. Repeat lactic acid is 8 Patient was emergently intubated as his respiratory drive started to fatigue. Have used ketamine for induction as well as continued sedation since the propofol immediately cause myocardial suppression and further hypotension. Place Foley catheter now. Monitor I/os Family was updated by Dr. Cherly Beach. Current plan is to consult cardiothoracic surgery for evaluation of Impella.  Unfortunately with the patient's late presentation he has advanced heart failure/cardiogenic shock related to the NSTEMI. Will continue to adjust therapy based on response to current therapies. ABG is pending.  Critical care time not including procedures was 55 minutes.
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
## 2021-02-22 NOTE — Op Note (Signed)
Pulmonary artery catheter insertion procedure Note  Cameron Zavala  [id number]  November 05, 1975  Date:01/27/2021  Time:1:22 AM   Provider Performing:Sana Tessmer R [name]   Procedure: Insertion of pulmonary artery catheter  Indication(s) Monitoring of invasive hemodynamics  Consent Emergent procedure-verbal consent was obtained from the patient.  Anesthesia Topical only with 1% lidocaine   Timeout Verified patient identification, verified procedure, site/side was marked, verified correct patient position, special equipment/implants available, medications/allergies/relevant history reviewed, required imaging and test results available.  Sterile Technique Maximal sterile technique including full sterile barrier drape, hand hygiene, sterile gown, sterile gloves, mask, hair covering, sterile ultrasound probe cover (if used).  Procedure Description Area of catheter insertion was cleaned with chlorhexidine and draped in sterile fashion.  A introducer sheath was placed into the left subclavian vein using the Salinger technique on the first attempt.. Nonpulsatile blood flow and easy flushing noted in all ports.  PA catheter is inflated through the introducer to 48 cm.  CVP on introduction was 32.  Initial PA pressures were 44/32 CVP, RV, PA, wedge pressures were identified on insertion.  The catheter was sutured in place and sterile dressing applied.  Complications/Tolerance None; patient tolerated the procedure well. Chest x-ray was completed and confirmed placement of the introducer PA catheter.  EBL Minimal  Specimen(s) None
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
## 2021-02-22 NOTE — Significant Event (Signed)
Called by ICU physician earlier in night as he was called for hypotension and line placement. Patient was admitted for NSTEMI with q waves and had significantly elevated hsT. Severe LV dysfxn per Dr. Jens Som note. On my assessment patient was in florid cardiogenic shock. Dr. Nicole Cella placed SGC, I placed A line with BP 60/40s and minimal pulsatility on [place]. Pressors rapidly uptitrated and milrinone added. Lactate returned (8.9). Discussed options for intervention with Dr. Allyson Sabal including coronary angio/IABP/mechanical support. Bedside TTE with global AK (EF ~15%), only septum HK but everything else severely HK to AK. Patient hx paraplegic but otherwise functional in wheelchair. Given profound shock appears surgical intervention with mechanical support would be only potential option however patient was in profound shock at that point and likely already too sick to intervene. Repeat ABG 6.9/25/266/6. Called and updated wife several times throughout the night with unfortunate continuing decompensation. Patient maxed out on 3 pressors and in multiorgan failure, tachycardic and still hypotensive with profound acidosis. Transitioned from FULL code to DNAR per wife discussion. Continue care otherwise. Wife is blind and unable to come to hospital. Checked into options to get her here before he passes but none currently available. Will continue to update her via phone.

## 2021-02-22 DEATH — deceased
